# Patient Record
Sex: Female | Born: 1950
Health system: Southern US, Community
[De-identification: ages and names within clinical notes are randomized; demographics above are authoritative.]

## PROBLEM LIST (undated history)

## (undated) DIAGNOSIS — M8580 Other specified disorders of bone density and structure, unspecified site: Secondary | ICD-10-CM

## (undated) DIAGNOSIS — K219 Gastro-esophageal reflux disease without esophagitis: Secondary | ICD-10-CM

## (undated) DIAGNOSIS — M1990 Unspecified osteoarthritis, unspecified site: Secondary | ICD-10-CM

## (undated) DIAGNOSIS — H269 Unspecified cataract: Secondary | ICD-10-CM

## (undated) DIAGNOSIS — I10 Essential (primary) hypertension: Secondary | ICD-10-CM

## (undated) DIAGNOSIS — N189 Chronic kidney disease, unspecified: Secondary | ICD-10-CM

## (undated) DIAGNOSIS — E785 Hyperlipidemia, unspecified: Secondary | ICD-10-CM

## (undated) HISTORY — DX: Gastro-esophageal reflux disease without esophagitis: K21.9

## (undated) HISTORY — PX: DG THUMB LEFT HAND: HXRAD1658

## (undated) HISTORY — DX: Other specified disorders of bone density and structure, unspecified site: M85.80

## (undated) HISTORY — PX: COLONOSCOPY: SHX174

## (undated) HISTORY — PX: OTHER SURGICAL HISTORY: SHX169

## (undated) HISTORY — DX: Hyperlipidemia, unspecified: E78.5

## (undated) HISTORY — PX: POLYPECTOMY: SHX149

## (undated) HISTORY — DX: Chronic kidney disease, unspecified: N18.9

## (undated) HISTORY — DX: Unspecified osteoarthritis, unspecified site: M19.90

## (undated) HISTORY — DX: Essential (primary) hypertension: I10

## (undated) HISTORY — DX: Unspecified cataract: H26.9

## (undated) HISTORY — PX: WISDOM TOOTH EXTRACTION: SHX21

---

## 1998-11-14 ENCOUNTER — Other Ambulatory Visit: Admission: RE | Admit: 1998-11-14 | Discharge: 1998-11-14 | Payer: Self-pay | Admitting: Obstetrics and Gynecology

## 2000-03-31 ENCOUNTER — Encounter: Payer: Self-pay | Admitting: Emergency Medicine

## 2000-03-31 ENCOUNTER — Emergency Department (HOSPITAL_COMMUNITY): Admission: EM | Admit: 2000-03-31 | Discharge: 2000-03-31 | Payer: Self-pay | Admitting: Emergency Medicine

## 2000-04-28 ENCOUNTER — Other Ambulatory Visit: Admission: RE | Admit: 2000-04-28 | Discharge: 2000-04-28 | Payer: Self-pay | Admitting: Obstetrics and Gynecology

## 2001-02-10 ENCOUNTER — Encounter: Payer: Self-pay | Admitting: Obstetrics and Gynecology

## 2001-02-10 ENCOUNTER — Encounter: Admission: RE | Admit: 2001-02-10 | Discharge: 2001-02-10 | Payer: Self-pay | Admitting: Obstetrics and Gynecology

## 2001-11-25 ENCOUNTER — Other Ambulatory Visit: Admission: RE | Admit: 2001-11-25 | Discharge: 2001-11-25 | Payer: Self-pay | Admitting: Obstetrics and Gynecology

## 2003-01-16 ENCOUNTER — Encounter: Payer: Self-pay | Admitting: Obstetrics and Gynecology

## 2003-01-16 ENCOUNTER — Encounter: Admission: RE | Admit: 2003-01-16 | Discharge: 2003-01-16 | Payer: Self-pay | Admitting: Obstetrics and Gynecology

## 2003-02-13 ENCOUNTER — Other Ambulatory Visit: Admission: RE | Admit: 2003-02-13 | Discharge: 2003-02-13 | Payer: Self-pay | Admitting: Obstetrics and Gynecology

## 2004-07-29 ENCOUNTER — Encounter: Admission: RE | Admit: 2004-07-29 | Discharge: 2004-07-29 | Payer: Self-pay | Admitting: Obstetrics and Gynecology

## 2004-10-30 ENCOUNTER — Ambulatory Visit: Payer: Self-pay | Admitting: Internal Medicine

## 2004-11-04 ENCOUNTER — Ambulatory Visit (HOSPITAL_COMMUNITY): Admission: RE | Admit: 2004-11-04 | Discharge: 2004-11-04 | Payer: Self-pay | Admitting: Internal Medicine

## 2004-11-20 ENCOUNTER — Ambulatory Visit: Payer: Self-pay | Admitting: Internal Medicine

## 2005-02-18 ENCOUNTER — Encounter: Admission: RE | Admit: 2005-02-18 | Discharge: 2005-02-18 | Payer: Self-pay | Admitting: Obstetrics and Gynecology

## 2006-02-12 ENCOUNTER — Ambulatory Visit: Payer: Self-pay | Admitting: Internal Medicine

## 2006-02-26 ENCOUNTER — Ambulatory Visit: Payer: Self-pay | Admitting: Internal Medicine

## 2006-03-04 ENCOUNTER — Ambulatory Visit: Payer: Self-pay | Admitting: Internal Medicine

## 2006-04-21 ENCOUNTER — Ambulatory Visit: Payer: Self-pay | Admitting: Internal Medicine

## 2006-04-21 ENCOUNTER — Encounter: Admission: RE | Admit: 2006-04-21 | Discharge: 2006-04-21 | Payer: Self-pay | Admitting: Obstetrics and Gynecology

## 2006-06-04 ENCOUNTER — Ambulatory Visit: Payer: Self-pay | Admitting: Internal Medicine

## 2006-09-02 ENCOUNTER — Ambulatory Visit: Payer: Self-pay | Admitting: Internal Medicine

## 2006-10-15 ENCOUNTER — Ambulatory Visit: Payer: Self-pay | Admitting: Gastroenterology

## 2006-10-29 ENCOUNTER — Ambulatory Visit: Payer: Self-pay | Admitting: Gastroenterology

## 2007-03-08 ENCOUNTER — Ambulatory Visit: Payer: Self-pay | Admitting: Internal Medicine

## 2007-07-05 ENCOUNTER — Encounter: Admission: RE | Admit: 2007-07-05 | Discharge: 2007-07-05 | Payer: Self-pay | Admitting: Internal Medicine

## 2007-10-20 ENCOUNTER — Encounter: Payer: Self-pay | Admitting: Internal Medicine

## 2007-10-20 DIAGNOSIS — E785 Hyperlipidemia, unspecified: Secondary | ICD-10-CM

## 2007-10-20 DIAGNOSIS — M810 Age-related osteoporosis without current pathological fracture: Secondary | ICD-10-CM | POA: Insufficient documentation

## 2007-10-20 DIAGNOSIS — M1990 Unspecified osteoarthritis, unspecified site: Secondary | ICD-10-CM | POA: Insufficient documentation

## 2007-10-20 DIAGNOSIS — I10 Essential (primary) hypertension: Secondary | ICD-10-CM | POA: Insufficient documentation

## 2008-11-08 ENCOUNTER — Encounter: Admission: RE | Admit: 2008-11-08 | Discharge: 2008-11-08 | Payer: Self-pay | Admitting: Obstetrics and Gynecology

## 2009-01-14 ENCOUNTER — Ambulatory Visit: Payer: Self-pay | Admitting: Internal Medicine

## 2009-01-14 LAB — CONVERTED CEMR LAB
Basophils Absolute: 0.1 10*3/uL (ref 0.0–0.1)
Bilirubin, Direct: 0.1 mg/dL (ref 0.0–0.3)
CO2: 30 meq/L (ref 19–32)
Calcium: 9.4 mg/dL (ref 8.4–10.5)
Chloride: 110 meq/L (ref 96–112)
Cholesterol: 227 mg/dL — ABNORMAL HIGH (ref 0–200)
Creatinine, Ser: 1 mg/dL (ref 0.4–1.2)
HCT: 36.6 % (ref 36.0–46.0)
HDL: 51.2 mg/dL (ref >39.00)
Lymphs Abs: 1.9 10*3/uL (ref 0.7–4.0)
MCV: 91.2 fL (ref 78.0–100.0)
Monocytes Absolute: 0.4 10*3/uL (ref 0.1–1.0)
Neutrophils Relative %: 54.1 % (ref 43.0–77.0)
Nitrite: NEGATIVE
Platelets: 293 10*3/uL (ref 150.0–400.0)
RDW: 12.6 % (ref 11.5–14.6)
Sodium: 143 meq/L (ref 135–145)
Specific Gravity, Urine: 1.02 (ref 1.000–1.030)
TSH: 0.76 microintl units/mL (ref 0.35–5.50)
Total Bilirubin: 0.7 mg/dL (ref 0.3–1.2)
Total CHOL/HDL Ratio: 4
Total Protein, Urine: NEGATIVE mg/dL
Triglycerides: 58 mg/dL (ref 0.0–149.0)
WBC: 5.5 10*3/uL (ref 4.5–10.5)
pH: 6 (ref 5.0–8.0)

## 2009-01-17 ENCOUNTER — Ambulatory Visit: Payer: Self-pay | Admitting: Internal Medicine

## 2009-01-17 DIAGNOSIS — M542 Cervicalgia: Secondary | ICD-10-CM

## 2009-01-31 ENCOUNTER — Telehealth: Payer: Self-pay | Admitting: Internal Medicine

## 2009-06-05 ENCOUNTER — Ambulatory Visit: Payer: Self-pay | Admitting: Internal Medicine

## 2009-06-05 DIAGNOSIS — K439 Ventral hernia without obstruction or gangrene: Secondary | ICD-10-CM | POA: Insufficient documentation

## 2009-06-05 DIAGNOSIS — Z87891 Personal history of nicotine dependence: Secondary | ICD-10-CM | POA: Insufficient documentation

## 2010-03-07 ENCOUNTER — Ambulatory Visit: Payer: Self-pay | Admitting: Internal Medicine

## 2010-03-07 DIAGNOSIS — J019 Acute sinusitis, unspecified: Secondary | ICD-10-CM

## 2010-05-12 ENCOUNTER — Ambulatory Visit: Payer: Self-pay | Admitting: Internal Medicine

## 2010-05-12 LAB — CONVERTED CEMR LAB
ALT: 18 units/L (ref 0–35)
BUN: 21 mg/dL (ref 6–23)
Bilirubin Urine: NEGATIVE
Bilirubin, Direct: 0.1 mg/dL (ref 0.0–0.3)
Calcium: 9.8 mg/dL (ref 8.4–10.5)
Chloride: 108 meq/L (ref 96–112)
Cholesterol: 240 mg/dL — ABNORMAL HIGH (ref 0–200)
Creatinine, Ser: 1 mg/dL (ref 0.4–1.2)
Eosinophils Absolute: 0.1 10*3/uL (ref 0.0–0.7)
Eosinophils Relative: 2 % (ref 0.0–5.0)
GFR calc non Af Amer: 72.41 mL/min (ref >60)
Ketones, ur: NEGATIVE mg/dL
Lymphocytes Relative: 30.3 % (ref 12.0–46.0)
MCV: 90.9 fL (ref 78.0–100.0)
Monocytes Absolute: 0.4 10*3/uL (ref 0.1–1.0)
Neutrophils Relative %: 61.2 % (ref 43.0–77.0)
Platelets: 299 10*3/uL (ref 150.0–400.0)
Total Bilirubin: 0.3 mg/dL (ref 0.3–1.2)
Total CHOL/HDL Ratio: 5
Triglycerides: 56 mg/dL (ref 0.0–149.0)
Urobilinogen, UA: 0.2 (ref 0.0–1.0)
VLDL: 11.2 mg/dL (ref 0.0–40.0)
WBC: 6.4 10*3/uL (ref 4.5–10.5)

## 2010-05-14 ENCOUNTER — Ambulatory Visit: Payer: Self-pay | Admitting: Internal Medicine

## 2010-05-14 ENCOUNTER — Encounter: Payer: Self-pay | Admitting: Internal Medicine

## 2010-09-25 NOTE — Assessment & Plan Note (Signed)
Summary: CPX/ [facility]  /#   Vital Signs:  Patient profile:   60 year old female Height:      65 inches Weight:      160 pounds BMI:     26.72 Temp:     98.0 degrees F oral Pulse rate:   72 / minute Pulse rhythm:   regular Resp:     16 per minute BP sitting:   118 / 82  (left arm) Cuff size:   regular  Vitals Entered By: Lanier Prude, Beverly Gust) (May 14, 2010 10:42 AM) CC: CPX Is Patient Diabetic? No Comments pt is not taking Vit d.   CC:  CPX.  History of Present Illness: The patient presents for a preventive health examination   Current Medications (verified): 1)  Cardizem Cd 180 Mg  Cp24 (Diltiazem Hcl Coated Beads) .... Once Daily 2)  Vitamin D3 1000 Unit  Tabs (Cholecalciferol) .Marland Kitchen.. 1 By Mouth Daily  Allergies (verified): No Known Drug Allergies  Past History:  Past Medical History: Last updated: 01/17/2009 Hyperlipidemia Hypertension Osteoarthritis both knees Osteoporosis GYN Dr [place] Colon 2009 low vit D   family h/o cad  father died 45 m mother died of caner ?  etol 59  Family History: Last updated: 01/17/2009 M head CA F MI  Past Surgical History: Reviewed history from 01/17/2009 and no changes required. Denies surgical history  Social History: Occupation: Married Former Smoker Alcohol use-no Regular exercise-no  Review of Systems  The patient denies anorexia, fever, weight loss, weight gain, vision loss, decreased hearing, hoarseness, chest pain, syncope, dyspnea on exertion, peripheral edema, prolonged cough, headaches, hemoptysis, abdominal pain, melena, hematochezia, severe indigestion/heartburn, hematuria, incontinence, genital sores, muscle weakness, suspicious skin lesions, transient blindness, difficulty walking, depression, unusual weight change, abnormal bleeding, enlarged lymph nodes, angioedema, and breast masses.    Physical Exam  General:  alert and well-developed.   Head:  normocephalic and atraumatic.   Eyes:   vision grossly intact, pupils equal, and pupils round.   Ears:  bialt tm's WNL Nose:  no discharge  mucosal pallor and mucosal edema.   Mouth:  no pharyngeal erythema and fair dentition.   Neck:  supple and no masses.   Lungs:  normal respiratory effort and normal breath sounds.   Heart:  normal rate and regular rhythm.   Abdomen:  Small about 2 cm soft midlie bulge, NT 3-4 cm above umbilicus Msk:  No deformity or scoliosis noted of thoracic or lumbar spine.  Stiff LS spine Extremities:  no edema, no erythema  Neurologic:  No cranial nerve deficits noted. Station and gait are normal. Plantar reflexes are down-going bilaterally. DTRs are symmetrical throughout. Sensory, motor and coordinative functions appear intact. Skin:  Intact without suspicious lesions or rashes Cervical Nodes:  No lymphadenopathy noted Inguinal Nodes:  No significant adenopathy Psych:  Cognition and judgment appear intact. Alert and cooperative with normal attention span and concentration. No apparent delusions, illusions, hallucinations   Impression & Recommendations:  Problem # 1:  PHYSICAL EXAMINATION (ICD-V70.0) Assessment New Health and age related issues were discussed. Available screening tests and vaccinations were discussed as well. Healthy life style including good diet and exercise was discussed.  The labs were reviewed with the patient.  Orders: EKG w/ Interpretation (93000)  Problem # 2:  HYPERTENSION (ICD-401.9) Assessment: Unchanged  Her updated medication list for this problem includes:    Cardizem Cd 180 Mg Cp24 (Diltiazem hcl coated beads) ..... Once daily  BP today: 118/82 Prior BP: 128/88 (03/07/2010)  Labs Reviewed: K+: 4.5 (05/12/2010) Creat: : 1.0 (05/12/2010)   Chol: 240 (05/12/2010)   HDL: 53.00 (05/12/2010)   TG: 56.0 (05/12/2010)  Problem # 3:  HYPERLIPIDEMIA (ICD-272.4) Assessment: Comment Only Declined statins  Complete Medication List: 1)  Cardizem Cd 180 Mg Cp24  (Diltiazem hcl coated beads) .... Once daily 2)  Vitamin D3 1000 Unit Tabs (Cholecalciferol) .Marland Kitchen.. 1 by mouth daily  Other Orders: Tdap => 8yrs IM (04540) Admin 1st Vaccine (98119)  Patient Instructions: 1)  Please schedule a follow-up appointment in 1 year. 2)  Start taking a yoga class  Prescriptions: VITAMIN D3 1000 UNIT  TABS (CHOLECALCIFEROL) 1 by mouth daily  #200 x 3   Entered and Authorized by:   Tresa Garter MD   Signed by:   Tresa Garter MD on 05/14/2010   Method used:   Electronically to        [facility] [phone]* (retail)       [address]       [place], [place]  [address]       Ph: [phone]       Fax: [phone]   RxID:   [id number] CARDIZEM CD 180 MG  CP24 (DILTIAZEM HCL COATED BEADS) once daily  #30 Capsule x 12   Entered and Authorized by:   Tresa Garter MD   Signed by:   Tresa Garter MD on 05/14/2010   Method used:   Electronically to        [facility] [phone]* (retail)       [address]       [place], [place]  [address]       Ph: [phone]       Fax: [phone]   RxID:   [id number]     Immunizations Administered:  Tetanus Vaccine:    Vaccine Type: Tdap    Site: left deltoid    Mfr: GlaxoSmithKline    Dose: 0.5 ml    Route: IM    Given by: Lanier Prude, CMA(AAMA)    Exp. Date: 06/12/2012    Lot #: [id number]    VIS given: 07/11/08 version given May 14, 2010.  Not Administered:    Influenza Vaccine not given due to: declined

## 2010-09-25 NOTE — Assessment & Plan Note (Signed)
Summary: ?viral infection/plot/cd   Vital Signs:  Patient profile:   60 year old female Height:      65 inches (165.10 cm) Weight:      159 pounds (72.27 kg) BMI:     26.55 O2 Sat:      97 % on Room air Temp:     97.9 degrees F (36.61 degrees C) oral Pulse rate:   82 / minute Pulse rhythm:   regular Resp:     16 per minute BP sitting:   128 / 88  (left arm) Cuff size:   regular  Vitals Entered By: Lanier Prude, CMA(AAMA) (March 07, 2010 1:54 PM)  O2 Flow:  Room air CC: sore throat, cough, ear pain X 3 wks Is Patient Diabetic? No   CC:  sore throat, cough, and ear pain X 3 wks.  History of Present Illness: here after tx for URi smtpoms 3 wks ago, resolved, then now returned it seems with several days acute onset mild to mod facial pain, pressure, fever , and greenish d/c;  Pt denies CP, sob, doe, wheezing, orthopnea, pnd, worsening LE edema, palps, dizziness or syncope  Pt denies new neuro symptoms such as headache, facial or extremity weakness   Overall good compliance with meds, good tolerance.  all good compliance with meds, good tolerance.    Problems Prior to Update: 1)  Sinusitis- Acute-nos  (ICD-461.9) 2)  Hernia, Ventral  (ICD-553.20) 3)  Tobacco Use, Quit  (ICD-V15.82) 4)  Neck Pain  (ICD-723.1) 5)  Physical Examination  (ICD-V70.0) 6)  Osteoporosis  (ICD-733.00) 7)  Osteoarthritis  (ICD-715.90) 8)  Hypertension  (ICD-401.9) 9)  Hyperlipidemia  (ICD-272.4)  Medications Prior to Update: 1)  Cardizem Cd 180 Mg  Cp24 (Diltiazem Hcl Coated Beads) .... Once Daily 2)  Vitamin D3 1000 Unit  Tabs (Cholecalciferol) .Marland Kitchen.. 1 By Mouth Daily  Current Medications (verified): 1)  Cardizem Cd 180 Mg  Cp24 (Diltiazem Hcl Coated Beads) .... Once Daily 2)  Vitamin D3 1000 Unit  Tabs (Cholecalciferol) .Marland Kitchen.. 1 By Mouth Daily 3)  Cephalexin 500 Mg Caps (Cephalexin) .Marland Kitchen.. 1 By Mouth Three Times A Day  Allergies (verified): No Known Drug Allergies  Past History:  Past Medical  History: Last updated: 01/17/2009 Hyperlipidemia Hypertension Osteoarthritis both knees Osteoporosis GYN Dr Billy Coast Colon 2009 low vit D   family h/o cad  father died 36 m mother died of caner ?  etol 63  Past Surgical History: Last updated: 01/17/2009 Denies surgical history  Social History: Last updated: 01/17/2009 Occupation: Married Former Smoker Alcohol use-no Regular exercise-yes  Risk Factors: Exercise: yes (01/17/2009)  Risk Factors: Smoking Status: quit (06/05/2009)  Review of Systems       all otherwise negative per pt -    Physical Exam  General:  alert and well-developed.   Head:  normocephalic and atraumatic.   Eyes:  vision grossly intact, pupils equal, and pupils round.   Ears:  bialt tm's red, sinus tender bilat Nose:  nasal dischargemucosal pallor and mucosal edema.   Mouth:  pharyngeal erythema and fair dentition.   Neck:  supple and no masses.   Lungs:  normal respiratory effort and normal breath sounds.   Heart:  normal rate and regular rhythm.   Extremities:  no edema, no erythema    Impression & Recommendations:  Problem # 1:  SINUSITIS- ACUTE-NOS (ICD-461.9)  Her updated medication list for this problem includes:    Cephalexin 500 Mg Caps (Cephalexin) .Marland Kitchen... 1 by mouth three times  a day treat as above, f/u any worsening signs or symptoms   Problem # 2:  HYPERTENSION (ICD-401.9)  Her updated medication list for this problem includes:    Cardizem Cd 180 Mg Cp24 (Diltiazem hcl coated beads) ..... Once daily  BP today: 128/88 Prior BP: 112/86 (06/05/2009)  Labs Reviewed: K+: 4.0 (01/14/2009) Creat: : 1.0 (01/14/2009)   Chol: 227 (01/14/2009)   HDL: 51.20 (01/14/2009)   TG: 58.0 (01/14/2009) stable overall by hx and exam, ok to continue meds/tx as is   Complete Medication List: 1)  Cardizem Cd 180 Mg Cp24 (Diltiazem hcl coated beads) .... Once daily 2)  Vitamin D3 1000 Unit Tabs (Cholecalciferol) .Marland Kitchen.. 1 by mouth daily 3)   Cephalexin 500 Mg Caps (Cephalexin) .Marland Kitchen.. 1 by mouth three times a day  Patient Instructions: 1)  Please take all new medications as prescribed 2)  Continue all previous medications as before this visit  3)  You can also use Mucinex OTC or it's generic for congestion  4)  Please schedule an appointment with your primary doctor as needed 5)  you can also consider allegra OTC if any congestion might seem allergic Prescriptions: CEPHALEXIN 500 MG CAPS (CEPHALEXIN) 1 by mouth three times a day  #30 x 0   Entered and Authorized by:   Corwin Levins MD   Signed by:   Corwin Levins MD on 03/07/2010   Method used:   Print then Give to Patient   RxID:   [id number]
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
## 2011-01-14 ENCOUNTER — Other Ambulatory Visit: Payer: Self-pay | Admitting: Obstetrics and Gynecology

## 2011-01-14 DIAGNOSIS — Z1231 Encounter for screening mammogram for malignant neoplasm of breast: Secondary | ICD-10-CM

## 2011-01-29 ENCOUNTER — Ambulatory Visit
Admission: RE | Admit: 2011-01-29 | Discharge: 2011-01-29 | Disposition: A | Discharge status: Home / Self Care | Payer: BC Managed Care – PPO | Source: Ambulatory Visit | Attending: Obstetrics and Gynecology | Admitting: Obstetrics and Gynecology

## 2011-01-29 DIAGNOSIS — Z1231 Encounter for screening mammogram for malignant neoplasm of breast: Secondary | ICD-10-CM

## 2011-05-22 ENCOUNTER — Other Ambulatory Visit: Payer: Self-pay | Admitting: Internal Medicine

## 2011-08-09 ENCOUNTER — Other Ambulatory Visit: Payer: Self-pay | Admitting: Internal Medicine

## 2011-11-20 ENCOUNTER — Other Ambulatory Visit: Payer: Self-pay | Admitting: Internal Medicine

## 2012-03-15 ENCOUNTER — Other Ambulatory Visit: Payer: Self-pay | Admitting: Internal Medicine

## 2012-03-18 ENCOUNTER — Other Ambulatory Visit: Payer: Self-pay | Admitting: *Deleted

## 2012-03-18 MED ORDER — DILTIAZEM HCL ER 180 MG PO CP24: 180.0000 mg | ORAL_CAPSULE | Freq: Every day | ORAL | Status: DC

## 2012-03-18 NOTE — Telephone Encounter (Signed)
Refill sent to [facility] for BP med. States has made appt.

## 2012-04-28 ENCOUNTER — Other Ambulatory Visit: Payer: Self-pay | Admitting: Obstetrics and Gynecology

## 2012-04-28 DIAGNOSIS — Z1231 Encounter for screening mammogram for malignant neoplasm of breast: Secondary | ICD-10-CM

## 2012-05-16 ENCOUNTER — Ambulatory Visit
Admission: RE | Admit: 2012-05-16 | Discharge: 2012-05-16 | Disposition: A | Discharge status: Home / Self Care | Payer: BC Managed Care – PPO | Source: Ambulatory Visit | Attending: Obstetrics and Gynecology | Admitting: Obstetrics and Gynecology

## 2012-05-16 DIAGNOSIS — Z1231 Encounter for screening mammogram for malignant neoplasm of breast: Secondary | ICD-10-CM

## 2012-05-25 ENCOUNTER — Other Ambulatory Visit: Payer: Self-pay | Admitting: Obstetrics and Gynecology

## 2012-05-25 DIAGNOSIS — M810 Age-related osteoporosis without current pathological fracture: Secondary | ICD-10-CM

## 2012-05-27 ENCOUNTER — Encounter: Payer: BC Managed Care – PPO | Admitting: Internal Medicine

## 2012-05-31 ENCOUNTER — Ambulatory Visit
Admission: RE | Admit: 2012-05-31 | Discharge: 2012-05-31 | Disposition: A | Discharge status: Home / Self Care | Payer: BC Managed Care – PPO | Source: Ambulatory Visit | Attending: Obstetrics and Gynecology | Admitting: Obstetrics and Gynecology

## 2012-05-31 DIAGNOSIS — M810 Age-related osteoporosis without current pathological fracture: Secondary | ICD-10-CM

## 2012-06-03 ENCOUNTER — Other Ambulatory Visit: Payer: Self-pay | Admitting: Internal Medicine

## 2012-06-03 DIAGNOSIS — Z0000 Encounter for general adult medical examination without abnormal findings: Secondary | ICD-10-CM

## 2012-08-03 ENCOUNTER — Other Ambulatory Visit (INDEPENDENT_AMBULATORY_CARE_PROVIDER_SITE_OTHER): Payer: BC Managed Care – PPO

## 2012-08-03 DIAGNOSIS — Z0000 Encounter for general adult medical examination without abnormal findings: Secondary | ICD-10-CM

## 2012-08-03 LAB — HEPATIC FUNCTION PANEL
ALT: 16 U/L (ref 0–35)
AST: 19 U/L (ref 0–37)
Albumin: 3.9 g/dL (ref 3.5–5.2)
Alkaline Phosphatase: 63 U/L (ref 39–117)
Total Protein: 7.5 g/dL (ref 6.0–8.3)

## 2012-08-03 LAB — BASIC METABOLIC PANEL
Calcium: 9.4 mg/dL (ref 8.4–10.5)
Chloride: 105 mEq/L (ref 96–112)
Creatinine, Ser: 1 mg/dL (ref 0.4–1.2)
GFR: 69.48 mL/min (ref >60.00)

## 2012-08-03 LAB — URINALYSIS, ROUTINE W REFLEX MICROSCOPIC
Specific Gravity, Urine: >=1.03 (ref 1.000–1.030)
Total Protein, Urine: NEGATIVE
Urine Glucose: NEGATIVE

## 2012-08-03 LAB — LIPID PANEL: VLDL: 13.8 mg/dL (ref 0.0–40.0)

## 2012-08-03 LAB — CBC WITH DIFFERENTIAL/PLATELET
Basophils Relative: 0.6 % (ref 0.0–3.0)
Eosinophils Absolute: 0.1 10*3/uL (ref 0.0–0.7)
Hemoglobin: 13.2 g/dL (ref 12.0–15.0)
Lymphocytes Relative: 32 % (ref 12.0–46.0)
MCHC: 33.4 g/dL (ref 30.0–36.0)
MCV: 89.7 fl (ref 78.0–100.0)
Neutro Abs: 4.2 10*3/uL (ref 1.4–7.7)
RBC: 4.39 Mil/uL (ref 3.87–5.11)

## 2012-08-03 LAB — TSH: TSH: 0.82 u[IU]/mL (ref 0.35–5.50)

## 2012-08-03 LAB — LDL CHOLESTEROL, DIRECT: Direct LDL: 190.3 mg/dL

## 2012-08-08 ENCOUNTER — Encounter: Payer: Self-pay | Admitting: Internal Medicine

## 2012-08-08 ENCOUNTER — Ambulatory Visit (INDEPENDENT_AMBULATORY_CARE_PROVIDER_SITE_OTHER): Payer: BC Managed Care – PPO | Admitting: Internal Medicine

## 2012-08-08 VITALS — BP 122/80 | HR 68 | Temp 98.3°F | Resp 16 | Ht 65.5 in | Wt 160.0 lb

## 2012-08-08 DIAGNOSIS — R430 Anosmia: Secondary | ICD-10-CM | POA: Insufficient documentation

## 2012-08-08 DIAGNOSIS — R439 Unspecified disturbances of smell and taste: Secondary | ICD-10-CM

## 2012-08-08 DIAGNOSIS — E785 Hyperlipidemia, unspecified: Secondary | ICD-10-CM

## 2012-08-08 DIAGNOSIS — Z0000 Encounter for general adult medical examination without abnormal findings: Secondary | ICD-10-CM

## 2012-08-08 DIAGNOSIS — Z23 Encounter for immunization: Secondary | ICD-10-CM

## 2012-08-08 DIAGNOSIS — I10 Essential (primary) hypertension: Secondary | ICD-10-CM

## 2012-08-08 DIAGNOSIS — Z2911 Encounter for prophylactic immunotherapy for respiratory syncytial virus (RSV): Secondary | ICD-10-CM

## 2012-08-08 MED ORDER — FLUTICASONE PROPIONATE 50 MCG/ACT NA SUSP: 2.0000 | Freq: Every day | NASAL | Status: DC

## 2012-08-08 MED ORDER — AMOXICILLIN 500 MG PO CAPS: 1000.0000 mg | ORAL_CAPSULE | Freq: Two times a day (BID) | ORAL | Status: DC

## 2012-08-08 MED ORDER — VITAMIN D 1000 UNITS PO TABS: 1000.0000 [IU] | ORAL_TABLET | Freq: Every day | ORAL | Status: AC

## 2012-08-08 MED ORDER — ASPIRIN EC 81 MG PO TBEC: 81.0000 mg | DELAYED_RELEASE_TABLET | Freq: Every day | ORAL | Status: DC

## 2012-08-08 MED ORDER — LORATADINE 10 MG PO TABS: 10.0000 mg | ORAL_TABLET | Freq: Every day | ORAL | Status: DC

## 2012-08-08 NOTE — Assessment & Plan Note (Signed)
Flonase Claritin Sinus rinse Amox ENT if not better

## 2012-08-08 NOTE — Progress Notes (Signed)
  Subjective:    Patient ID: Joann Fields, female    DOB: 01/25/1951, 61 y.o.   MRN: [id number]  HPI  The patient is here for a wellness exam. The patient has been doing well overall without major physical or psychological issues going on lately.   Review of Systems  Constitutional: Negative for fever, chills, diaphoresis, activity change, appetite change, fatigue and unexpected weight change.  HENT: Negative for hearing loss, ear pain, congestion, sore throat, sneezing, mouth sores, neck pain, dental problem, voice change, postnasal drip and sinus pressure.   Eyes: Negative for pain and visual disturbance.  Respiratory: Negative for cough, chest tightness, wheezing and stridor.   Cardiovascular: Negative for chest pain, palpitations and leg swelling.  Gastrointestinal: Negative for nausea, vomiting, abdominal pain, blood in stool, abdominal distention and rectal pain.  Genitourinary: Negative for dysuria, hematuria, decreased urine volume, vaginal bleeding, vaginal discharge, difficulty urinating, vaginal pain and menstrual problem.  Musculoskeletal: Negative for back pain, joint swelling and gait problem.  Skin: Negative for color change, rash and wound.  Neurological: Negative for dizziness, tremors, syncope, speech difficulty and light-headedness.  Hematological: Negative for adenopathy.  Psychiatric/Behavioral: Negative for suicidal ideas, hallucinations, behavioral problems, confusion, sleep disturbance, dysphoric mood and decreased concentration. The patient is not hyperactive.    BP Readings from Last 3 Encounters:  08/08/12 122/80  05/14/10 118/82  03/07/10 128/88   Wt Readings from Last 3 Encounters:  08/08/12 160 lb (72.576 kg)  05/14/10 160 lb (72.576 kg)  03/07/10 159 lb (72.122 kg)       Objective:   Physical Exam  Constitutional: She appears well-developed. No distress.  HENT:  Head: Normocephalic.  Right Ear: External ear normal.  Left Ear: External ear  normal.  Nose: Nose normal.  Mouth/Throat: Oropharynx is clear and moist.  Eyes: Conjunctivae normal are normal. Pupils are equal, round, and reactive to light. Right eye exhibits no discharge. Left eye exhibits no discharge.  Neck: Normal range of motion. Neck supple. No JVD present. No tracheal deviation present. No thyromegaly present.  Cardiovascular: Normal rate, regular rhythm and normal heart sounds.   Pulmonary/Chest: No stridor. No respiratory distress. She has no wheezes.  Abdominal: Soft. Bowel sounds are normal. She exhibits no distension and no mass. There is no tenderness. There is no rebound and no guarding.  Musculoskeletal: She exhibits no edema and no tenderness.  Lymphadenopathy:    She has no cervical adenopathy.  Neurological: She displays normal reflexes. No cranial nerve deficit. She exhibits normal muscle tone. Coordination normal.  Skin: No rash noted. No erythema.  Psychiatric: She has a normal mood and affect. Her behavior is normal. Judgment and thought content normal.    Lab Results  Component Value Date   WBC 7.1 08/03/2012   HGB 13.2 08/03/2012   HCT 39.4 08/03/2012   PLT 318.0 08/03/2012   GLUCOSE 101* 08/03/2012   CHOL 246* 08/03/2012   TRIG 69.0 08/03/2012   HDL 46.80 08/03/2012   LDLDIRECT 190.3 08/03/2012   ALT 16 08/03/2012   AST 19 08/03/2012   NA 141 08/03/2012   K 4.2 08/03/2012   CL 105 08/03/2012   CREATININE 1.0 08/03/2012   BUN 15 08/03/2012   CO2 29 08/03/2012   TSH 0.82 08/03/2012         Assessment & Plan:

## 2012-08-08 NOTE — Assessment & Plan Note (Signed)
Declined statins. 

## 2012-08-09 ENCOUNTER — Encounter: Payer: Self-pay | Admitting: Internal Medicine

## 2012-08-20 NOTE — Assessment & Plan Note (Signed)
Continue with current prescription therapy as reflected on the Med list.  

## 2012-08-20 NOTE — Assessment & Plan Note (Signed)
We discussed age appropriate health related issues, including available/recomended screening tests and vaccinations. We discussed a need for adhering to healthy diet and exercise. Labs/EKG were reviewed/ordered. All questions were answered.   

## 2012-09-20 ENCOUNTER — Other Ambulatory Visit: Payer: Self-pay | Admitting: Endodontics

## 2012-09-28 ENCOUNTER — Ambulatory Visit (INDEPENDENT_AMBULATORY_CARE_PROVIDER_SITE_OTHER): Payer: BC Managed Care – PPO | Admitting: Internal Medicine

## 2012-09-28 ENCOUNTER — Encounter: Payer: Self-pay | Admitting: Internal Medicine

## 2012-09-28 ENCOUNTER — Telehealth: Payer: Self-pay | Admitting: Internal Medicine

## 2012-09-28 VITALS — BP 110/88 | HR 96 | Temp 98.2°F | Resp 16 | Wt 160.0 lb

## 2012-09-28 DIAGNOSIS — T887XXA Unspecified adverse effect of drug or medicament, initial encounter: Secondary | ICD-10-CM

## 2012-09-28 DIAGNOSIS — L299 Pruritus, unspecified: Secondary | ICD-10-CM

## 2012-09-28 DIAGNOSIS — T50905A Adverse effect of unspecified drugs, medicaments and biological substances, initial encounter: Secondary | ICD-10-CM

## 2012-09-28 MED ORDER — METHYLPREDNISOLONE ACETATE 80 MG/ML IJ SUSP
120.0000 mg | Freq: Once | INTRAMUSCULAR | Status: AC
  Administered 2012-09-28: 120 mg via INTRAMUSCULAR

## 2012-09-28 MED ORDER — PREDNISONE 10 MG PO TABS: ORAL_TABLET | ORAL | Status: DC

## 2012-09-28 NOTE — Telephone Encounter (Signed)
Patient Information:  Caller Name: Zoriyah  Phone: [phone]  Patient: Joann Fields, Joann Fields  Gender: Female  DOB: September 02, 1950  Age: 62 Years  PCP: Plotnikov, Alex (Adults only)  Office Follow Up:  Does the office need to follow up with this patient?: No  Instructions For The Office: N/A   Symptoms  Reason For Call & Symptoms: Reports rash started on abdomen and has spread to arms, back and legs. Rash is not located on the face or neck. Reports itching.  Reviewed Health History In EMR: Yes  Reviewed Medications In EMR: Yes  Reviewed Allergies In EMR: Yes  Reviewed Surgeries / Procedures: Yes  Date of Onset of Symptoms: 09/27/2012  Guideline(s) Used:  Rash or Redness - Widespread  Rash - Widespread on Drugs - Drug Reaction  Disposition Per Guideline:   See Today in Office  Reason For Disposition Reached:   All other patients with widespread rash taking a new medication  Advice Given:  N/A  Appointment Scheduled:  09/28/2012 15:15:00 Appointment Scheduled Provider:  Nicki Reaper

## 2012-09-28 NOTE — Patient Instructions (Signed)

## 2012-09-28 NOTE — Progress Notes (Signed)
Subjective:    Patient ID: Joann Fields, female    DOB: 08/27/1950, 62 y.o.   MRN: [id number]  HPI  Pt present to the clinic today with c/o a rash all over her body. This started yesterday. It is very itchy. She has taken benadryl which has helped with the itching but has not made the rash go away. She has been taking Clindamycin and codeine over the past 2 weeks following dental surgery. She has no drug allergies that she is aware of. She has not had swelling of the lips or trouble breathing.  Review of Systems      Past Medical History  Diagnosis Date  . Hypertension   . Hyperlipidemia     Current Outpatient Prescriptions  Medication Sig Dispense Refill  . amoxicillin (AMOXIL) 500 MG capsule Take 2 capsules (1,000 mg total) by mouth 2 (two) times daily.  40 capsule  0  . aspirin EC 81 MG tablet Take 1 tablet (81 mg total) by mouth daily.  100 tablet  3  . cholecalciferol (VITAMIN D) 1000 UNITS tablet Take 1 tablet (1,000 Units total) by mouth daily.  100 tablet  3  . diltiazem (DILACOR XR) 180 MG 24 hr capsule Take 1 capsule (180 mg total) by mouth daily.  90 capsule  3  . fluticasone (FLONASE) 50 MCG/ACT nasal spray Place 2 sprays into the nose daily.  16 g  6  . loratadine (CLARITIN) 10 MG tablet Take 1 tablet (10 mg total) by mouth daily.  100 tablet  3    No Known Allergies  Family History  Problem Relation Age of Onset  . Cancer Mother 69    ?  Marland Kitchen Heart disease Father 54    MI  . Alzheimer's disease Brother 38  . Early death Maternal Aunt   . Hearing loss Maternal Aunt     History   Social History  . Marital Status: Single    Spouse Name: N/A    Number of Children: N/A  . Years of Education: N/A   Occupational History  . Not on file.   Social History Main Topics  . Smoking status: Former Games developer  . Smokeless tobacco: Not on file     Comment: only in College  . Alcohol Use: No  . Drug Use: No  . Sexually Active: Not on file   Other Topics Concern   . Not on file   Social History Narrative  . No narrative on file     Constitutional: Denies fever, malaise, fatigue, headache or abrupt weight changes.  Skin: Pt reports generalized itchy rash. Denies redness, lesions or ulcercations.    No other specific complaints in a complete review of systems (except as listed in HPI above).  Objective:   Physical Exam   BP 110/88  Pulse 96  Temp 98.2 F (36.8 C) (Oral)  Resp 16  Wt 160 lb (72.576 kg)  SpO2 97% Wt Readings from Last 3 Encounters:  09/28/12 160 lb (72.576 kg)  08/08/12 160 lb (72.576 kg)  05/14/10 160 lb (72.576 kg)    General: Appears her stated age, well developed, well nourished in NAD. Skin: Pruritic rash noted on abdomen, left arm, entire back and legs, resembling allergic reaction. Cardiovascular: Normal rate and rhythm. S1,S2 noted.  No murmur, rubs or gallops noted. No JVD or BLE edema. No carotid bruits noted. Pulmonary/Chest: Normal effort and positive vesicular breath sounds. No respiratory distress. No wheezes, rales or ronchi noted.  Assessment & Plan:   Puritus, secondary to medication reaction, new onset with additional workup required:  80mg  Depo today IM eRx for pred taper Take benadryl as needed for itching  RTC as needed or if symptoms persist

## 2012-09-28 NOTE — Addendum Note (Signed)
Addended by: Elnora Morrison on: 09/28/2012 04:01 PM   Modules accepted: Orders

## 2013-01-09 ENCOUNTER — Other Ambulatory Visit: Payer: Self-pay | Admitting: Internal Medicine

## 2013-02-17 ENCOUNTER — Encounter: Payer: Self-pay | Admitting: Internal Medicine

## 2013-02-17 ENCOUNTER — Ambulatory Visit (INDEPENDENT_AMBULATORY_CARE_PROVIDER_SITE_OTHER): Payer: BC Managed Care – PPO | Admitting: Internal Medicine

## 2013-02-17 VITALS — BP 138/76 | HR 67 | Temp 97.8°F | Resp 16 | Wt 154.1 lb

## 2013-02-17 DIAGNOSIS — L720 Epidermal cyst: Secondary | ICD-10-CM

## 2013-02-17 DIAGNOSIS — B351 Tinea unguium: Secondary | ICD-10-CM

## 2013-02-17 DIAGNOSIS — L723 Sebaceous cyst: Secondary | ICD-10-CM

## 2013-02-17 NOTE — Progress Notes (Signed)
Subjective:    Patient ID: Joann Fields, female    DOB: May 08, 1951, 62 y.o.   MRN: [id number]  HPI  Pt presents to the clinic today with c/o a lump underneath her skin in her right arm. It is small and nontender. She has had these little lumps before but cannot remember why she has them. She also has concerns about toenail fungus. She reports that her toenails are dark and sometimes fall off. She has never had treatment for this in the past. She would like to know what can be done about it.  Review of Systems      Past Medical History  Diagnosis Date  . Hypertension   . Hyperlipidemia     Current Outpatient Prescriptions  Medication Sig Dispense Refill  . amoxicillin (AMOXIL) 500 MG capsule Take 2 capsules (1,000 mg total) by mouth 2 (two) times daily.  40 capsule  0  . aspirin EC 81 MG tablet Take 1 tablet (81 mg total) by mouth daily.  100 tablet  3  . cholecalciferol (VITAMIN D) 1000 UNITS tablet Take 1 tablet (1,000 Units total) by mouth daily.  100 tablet  3  . diltiazem (DILACOR XR) 180 MG 24 hr capsule Take 1 capsule (180 mg total) by mouth daily.  90 capsule  3  . fluticasone (FLONASE) 50 MCG/ACT nasal spray Place 2 sprays into the nose daily.  16 g  6  . loratadine (CLARITIN) 10 MG tablet Take 1 tablet (10 mg total) by mouth daily.  100 tablet  3  . predniSONE (DELTASONE) 10 MG tablet Take 3 tablets on days 1-3, take 2 tablets on days 4-6, take 1 tablet on days 7-9  18 tablet  0   No current facility-administered medications for this visit.    No Known Allergies  Family History  Problem Relation Age of Onset  . Cancer Mother 14    ?  Marland Kitchen Heart disease Father 69    MI  . Alzheimer's disease Brother 109  . Early death Maternal Aunt   . Hearing loss Maternal Aunt     History   Social History  . Marital Status: Single    Spouse Name: N/A    Number of Children: N/A  . Years of Education: N/A   Occupational History  . Not on file.   Social History Main  Topics  . Smoking status: Former Games developer  . Smokeless tobacco: Not on file     Comment: only in College  . Alcohol Use: No  . Drug Use: No  . Sexually Active: Not on file   Other Topics Concern  . Not on file   Social History Narrative  . No narrative on file     Constitutional: Denies fever, malaise, fatigue, headache or abrupt weight changes.  Skin: Pt reports lump underneath skin and toe fungus. Denies redness, rashes, lesions or ulcercations.    No other specific complaints in a complete review of systems (except as listed in HPI above).  Objective:   Physical Exam  BP 138/76  Pulse 67  Temp(Src) 97.8 F (36.6 C) (Oral)  Resp 16  Wt 154 lb 1.3 oz (69.89 kg)  BMI 25.24 kg/m2  SpO2 96% Wt Readings from Last 3 Encounters:  02/17/13 154 lb 1.3 oz (69.89 kg)  09/28/12 160 lb (72.576 kg)  08/08/12 160 lb (72.576 kg)    General: Appears their stated age, well developed, well nourished in NAD. Skin: Warm, dry and intact. No rashes, lesions or  ulcerations noted. Epidermal inclusion cyst of right armpit. Toenail fungus noted on feet bilaterally. Cardiovascular: Normal rate and rhythm. S1,S2 noted.  No murmur, rubs or gallops noted. No JVD or BLE edema. No carotid bruits noted. Pulmonary/Chest: Normal effort and positive vesicular breath sounds. No respiratory distress. No wheezes, rales or ronchi noted.   :  BMET    Component Value Date/Time   NA 141 08/03/2012 1109   K 4.2 08/03/2012 1109   CL 105 08/03/2012 1109   CO2 29 08/03/2012 1109   GLUCOSE 101* 08/03/2012 1109   BUN 15 08/03/2012 1109   CREATININE 1.0 08/03/2012 1109   CALCIUM 9.4 08/03/2012 1109   GFRNONAA 72.41 05/12/2010 0916    Lipid Panel     Component Value Date/Time   CHOL 246* 08/03/2012 1109   TRIG 69.0 08/03/2012 1109   HDL 46.80 08/03/2012 1109   CHOLHDL 5 08/03/2012 1109   VLDL 13.8 08/03/2012 1109    CBC    Component Value Date/Time   WBC 7.1 08/03/2012 1109   RBC 4.39 08/03/2012  1109   HGB 13.2 08/03/2012 1109   HCT 39.4 08/03/2012 1109   PLT 318.0 08/03/2012 1109   MCV 89.7 08/03/2012 1109   MCHC 33.4 08/03/2012 1109   RDW 13.7 08/03/2012 1109   LYMPHSABS 2.3 08/03/2012 1109   MONOABS 0.5 08/03/2012 1109   EOSABS 0.1 08/03/2012 1109   BASOSABS 0.0 08/03/2012 1109    Hgb A1C No results found for this basename: HGBA1C         Assessment & Plan:   Epidermal inclusion cyst, right armpit:  Reassurance given If it is not bothering you, I would not do anything about it Watch for signs of redness, swelling and tenderness  Onychomycosis, bilateral feet:  Discussed different treatment options risks and benefits Pt would like to defer treatment at this time.

## 2013-02-17 NOTE — Patient Instructions (Signed)

## 2013-04-21 ENCOUNTER — Other Ambulatory Visit: Payer: Self-pay | Admitting: Internal Medicine

## 2013-05-02 ENCOUNTER — Other Ambulatory Visit: Payer: Self-pay

## 2013-05-02 DIAGNOSIS — Z1231 Encounter for screening mammogram for malignant neoplasm of breast: Secondary | ICD-10-CM

## 2013-06-06 ENCOUNTER — Ambulatory Visit: Payer: BC Managed Care – PPO

## 2013-07-26 ENCOUNTER — Ambulatory Visit
Admission: RE | Admit: 2013-07-26 | Discharge: 2013-07-26 | Disposition: A | Discharge status: Home / Self Care | Payer: BC Managed Care – PPO | Source: Ambulatory Visit

## 2013-07-26 DIAGNOSIS — Z1231 Encounter for screening mammogram for malignant neoplasm of breast: Secondary | ICD-10-CM

## 2013-09-04 ENCOUNTER — Encounter: Payer: BC Managed Care – PPO | Admitting: Internal Medicine

## 2013-09-12 ENCOUNTER — Encounter: Payer: Self-pay | Admitting: Internal Medicine

## 2013-09-12 ENCOUNTER — Ambulatory Visit (INDEPENDENT_AMBULATORY_CARE_PROVIDER_SITE_OTHER): Payer: BC Managed Care – PPO | Admitting: Internal Medicine

## 2013-09-12 VITALS — BP 130/90 | HR 76 | Temp 98.3°F | Resp 16 | Ht 65.0 in | Wt 153.0 lb

## 2013-09-12 DIAGNOSIS — R430 Anosmia: Secondary | ICD-10-CM

## 2013-09-12 DIAGNOSIS — I10 Essential (primary) hypertension: Secondary | ICD-10-CM

## 2013-09-12 DIAGNOSIS — M1990 Unspecified osteoarthritis, unspecified site: Secondary | ICD-10-CM

## 2013-09-12 DIAGNOSIS — Z0000 Encounter for general adult medical examination without abnormal findings: Secondary | ICD-10-CM

## 2013-09-12 DIAGNOSIS — Z23 Encounter for immunization: Secondary | ICD-10-CM

## 2013-09-12 DIAGNOSIS — R439 Unspecified disturbances of smell and taste: Secondary | ICD-10-CM

## 2013-09-12 DIAGNOSIS — H6120 Impacted cerumen, unspecified ear: Secondary | ICD-10-CM | POA: Insufficient documentation

## 2013-09-12 MED ORDER — VITAMIN D 1000 UNITS PO TABS: 1000.0000 [IU] | ORAL_TABLET | Freq: Every day | ORAL | Status: AC

## 2013-09-12 NOTE — Progress Notes (Signed)
Subjective:   HPI  The patient is here for a wellness exam. The patient has been doing well overall without major physical or psychological issues going on lately, except for some family stress... C/o poor sense of smell  Review of Systems  Constitutional: Negative for fever, chills, diaphoresis, activity change, appetite change, fatigue and unexpected weight change.  HENT: Negative for congestion, dental problem, ear pain, hearing loss, mouth sores, postnasal drip, sinus pressure, sneezing, sore throat and voice change.   Eyes: Negative for pain and visual disturbance.  Respiratory: Negative for cough, chest tightness, wheezing and stridor.   Cardiovascular: Negative for chest pain, palpitations and leg swelling.  Gastrointestinal: Negative for nausea, vomiting, abdominal pain, blood in stool, abdominal distention and rectal pain.  Genitourinary: Negative for dysuria, hematuria, decreased urine volume, vaginal bleeding, vaginal discharge, difficulty urinating, vaginal pain and menstrual problem.  Musculoskeletal: Negative for back pain, gait problem, joint swelling and neck pain.  Skin: Negative for color change, rash and wound.  Neurological: Negative for dizziness, tremors, syncope, speech difficulty and light-headedness.  Hematological: Negative for adenopathy.  Psychiatric/Behavioral: Negative for suicidal ideas, hallucinations, behavioral problems, confusion, sleep disturbance, dysphoric mood and decreased concentration. The patient is not hyperactive.    BP Readings from Last 3 Encounters:  09/12/13 130/90  02/17/13 138/76  09/28/12 110/88   Wt Readings from Last 3 Encounters:  09/12/13 153 lb (69.4 kg)  02/17/13 154 lb 1.3 oz (69.89 kg)  09/28/12 160 lb (72.576 kg)       Objective:   Physical Exam  Constitutional: She appears well-developed. No distress.  HENT:  Head: Normocephalic.  Right Ear: External ear normal.  Left Ear: External ear normal.  Nose: Nose  normal.  Mouth/Throat: Oropharynx is clear and moist.  Eyes: Conjunctivae are normal. Pupils are equal, round, and reactive to light. Right eye exhibits no discharge. Left eye exhibits no discharge.  Neck: Normal range of motion. Neck supple. No JVD present. No tracheal deviation present. No thyromegaly present.  Cardiovascular: Normal rate, regular rhythm and normal heart sounds.   Pulmonary/Chest: No stridor. No respiratory distress. She has no wheezes.  Abdominal: Soft. Bowel sounds are normal. She exhibits no distension and no mass. There is no tenderness. There is no rebound and no guarding.  Musculoskeletal: She exhibits no edema and no tenderness.  Lymphadenopathy:    She has no cervical adenopathy.  Neurological: She displays normal reflexes. No cranial nerve deficit. She exhibits normal muscle tone. Coordination normal.  Skin: No rash noted. No erythema.  Psychiatric: She has a normal mood and affect. Her behavior is normal. Judgment and thought content normal.  R wax  Lab Results  Component Value Date   WBC 7.1 08/03/2012   HGB 13.2 08/03/2012   HCT 39.4 08/03/2012   PLT 318.0 08/03/2012   GLUCOSE 101* 08/03/2012   CHOL 246* 08/03/2012   TRIG 69.0 08/03/2012   HDL 46.80 08/03/2012   LDLDIRECT 190.3 08/03/2012   ALT 16 08/03/2012   AST 19 08/03/2012   NA 141 08/03/2012   K 4.2 08/03/2012   CL 105 08/03/2012   CREATININE 1.0 08/03/2012   BUN 15 08/03/2012   CO2 29 08/03/2012   TSH 0.82 08/03/2012     Procedure Note :     Procedure :  Ear irrigation   Indication:  Cerumen impaction R   Risks, including pain, dizziness, eardrum perforation, bleeding, infection and others as well as benefits were explained to the patient in detail. Verbal consent was  obtained and the patient agreed to proceed.    We used "The Elephant Ear Irrigation Device" filled with lukewarm water for irrigation. A large amount wax was recovered. Procedure has also required manual wax removal with  an ear loop.   Tolerated well. Complications: None.   Postprocedure instructions :  Call if problems.       Assessment & Plan:

## 2013-09-12 NOTE — Assessment & Plan Note (Signed)
Continue with current prescription therapy as reflected on the Med list.  

## 2013-09-12 NOTE — Progress Notes (Signed)
Pre visit review using our clinic review tool, if applicable. No additional management support is needed unless otherwise documented below in the visit note. 

## 2013-09-12 NOTE — Assessment & Plan Note (Signed)
Tylenol prn 

## 2013-09-12 NOTE — Assessment & Plan Note (Signed)
Will irrigate 

## 2013-09-12 NOTE — Assessment & Plan Note (Signed)
ENT ref 

## 2013-09-12 NOTE — Assessment & Plan Note (Signed)
labs

## 2013-09-21 ENCOUNTER — Other Ambulatory Visit (INDEPENDENT_AMBULATORY_CARE_PROVIDER_SITE_OTHER): Payer: BC Managed Care – PPO

## 2013-09-21 DIAGNOSIS — I10 Essential (primary) hypertension: Secondary | ICD-10-CM

## 2013-09-21 DIAGNOSIS — R430 Anosmia: Secondary | ICD-10-CM

## 2013-09-21 DIAGNOSIS — Z0000 Encounter for general adult medical examination without abnormal findings: Secondary | ICD-10-CM

## 2013-09-21 DIAGNOSIS — M1990 Unspecified osteoarthritis, unspecified site: Secondary | ICD-10-CM

## 2013-09-21 DIAGNOSIS — R439 Unspecified disturbances of smell and taste: Secondary | ICD-10-CM

## 2013-09-21 LAB — VITAMIN B12: Vitamin B-12: 234 pg/mL (ref 211–911)

## 2013-09-21 LAB — URINALYSIS, ROUTINE W REFLEX MICROSCOPIC
Bilirubin Urine: NEGATIVE
Ketones, ur: NEGATIVE
Nitrite: NEGATIVE
SPECIFIC GRAVITY, URINE: 1.025 (ref 1.000–1.030)
Total Protein, Urine: NEGATIVE
URINE GLUCOSE: NEGATIVE
UROBILINOGEN UA: 0.2 (ref 0.0–1.0)
pH: 6 (ref 5.0–8.0)

## 2013-09-21 LAB — LIPID PANEL
CHOL/HDL RATIO: 4
CHOLESTEROL: 241 mg/dL — AB (ref 0–200)
HDL: 53.8 mg/dL (ref >39.00)
Triglycerides: 122 mg/dL (ref 0.0–149.0)
VLDL: 24.4 mg/dL (ref 0.0–40.0)

## 2013-09-21 LAB — CBC WITH DIFFERENTIAL/PLATELET
Basophils Absolute: 0 10*3/uL (ref 0.0–0.1)
Basophils Relative: 0.6 % (ref 0.0–3.0)
EOS PCT: 1.4 % (ref 0.0–5.0)
Eosinophils Absolute: 0.1 10*3/uL (ref 0.0–0.7)
HCT: 41 % (ref 36.0–46.0)
HEMOGLOBIN: 13.5 g/dL (ref 12.0–15.0)
LYMPHS ABS: 2.4 10*3/uL (ref 0.7–4.0)
LYMPHS PCT: 34.2 % (ref 12.0–46.0)
MCHC: 32.8 g/dL (ref 30.0–36.0)
MCV: 92.2 fl (ref 78.0–100.0)
MONOS PCT: 6.9 % (ref 3.0–12.0)
Monocytes Absolute: 0.5 10*3/uL (ref 0.1–1.0)
Neutro Abs: 4.1 10*3/uL (ref 1.4–7.7)
Neutrophils Relative %: 56.9 % (ref 43.0–77.0)
Platelets: 318 10*3/uL (ref 150.0–400.0)
RBC: 4.45 Mil/uL (ref 3.87–5.11)
RDW: 13.8 % (ref 11.5–14.6)
WBC: 7.2 10*3/uL (ref 4.5–10.5)

## 2013-09-21 LAB — BASIC METABOLIC PANEL
BUN: 17 mg/dL (ref 6–23)
CALCIUM: 9.9 mg/dL (ref 8.4–10.5)
CHLORIDE: 107 meq/L (ref 96–112)
CO2: 28 meq/L (ref 19–32)
Creatinine, Ser: 1 mg/dL (ref 0.4–1.2)
GFR: 72.47 mL/min (ref >60.00)
GLUCOSE: 96 mg/dL (ref 70–99)
Potassium: 4 mEq/L (ref 3.5–5.1)
SODIUM: 141 meq/L (ref 135–145)

## 2013-09-21 LAB — LDL CHOLESTEROL, DIRECT: Direct LDL: 165.3 mg/dL

## 2013-09-21 LAB — HEPATIC FUNCTION PANEL
ALT: 14 U/L (ref 0–35)
AST: 20 U/L (ref 0–37)
Albumin: 3.8 g/dL (ref 3.5–5.2)
Alkaline Phosphatase: 60 U/L (ref 39–117)
BILIRUBIN TOTAL: 0.6 mg/dL (ref 0.3–1.2)
Bilirubin, Direct: 0 mg/dL (ref 0.0–0.3)
Total Protein: 7.4 g/dL (ref 6.0–8.3)

## 2013-09-21 LAB — TSH: TSH: 1.07 u[IU]/mL (ref 0.35–5.50)

## 2013-09-22 ENCOUNTER — Other Ambulatory Visit: Payer: Self-pay | Admitting: Internal Medicine

## 2013-09-22 MED ORDER — VITAMIN B-12 1000 MCG SL SUBL: 1.0000 | SUBLINGUAL_TABLET | Freq: Every day | SUBLINGUAL | Status: DC

## 2014-05-22 ENCOUNTER — Other Ambulatory Visit: Payer: Self-pay | Admitting: *Deleted

## 2014-05-22 MED ORDER — DILTIAZEM HCL ER 180 MG PO CP24: 180.0000 mg | ORAL_CAPSULE | Freq: Every day | ORAL | Status: DC

## 2014-08-06 ENCOUNTER — Other Ambulatory Visit: Payer: Self-pay | Admitting: Obstetrics and Gynecology

## 2014-08-06 DIAGNOSIS — M810 Age-related osteoporosis without current pathological fracture: Secondary | ICD-10-CM

## 2014-08-14 ENCOUNTER — Ambulatory Visit
Admission: RE | Admit: 2014-08-14 | Discharge: 2014-08-14 | Disposition: A | Discharge status: Home / Self Care | Payer: 59 | Source: Ambulatory Visit | Attending: Obstetrics and Gynecology | Admitting: Obstetrics and Gynecology

## 2014-08-14 DIAGNOSIS — M810 Age-related osteoporosis without current pathological fracture: Secondary | ICD-10-CM

## 2014-09-14 ENCOUNTER — Encounter: Payer: BC Managed Care – PPO | Admitting: Internal Medicine

## 2014-09-24 ENCOUNTER — Encounter: Payer: Self-pay | Admitting: Internal Medicine

## 2014-09-24 ENCOUNTER — Ambulatory Visit (INDEPENDENT_AMBULATORY_CARE_PROVIDER_SITE_OTHER): Payer: Medicare Other | Admitting: Internal Medicine

## 2014-09-24 VITALS — BP 138/94 | HR 83 | Temp 98.2°F | Ht 65.0 in | Wt 158.0 lb

## 2014-09-24 DIAGNOSIS — M810 Age-related osteoporosis without current pathological fracture: Secondary | ICD-10-CM

## 2014-09-24 DIAGNOSIS — Z0000 Encounter for general adult medical examination without abnormal findings: Secondary | ICD-10-CM

## 2014-09-24 DIAGNOSIS — Z23 Encounter for immunization: Secondary | ICD-10-CM

## 2014-09-24 DIAGNOSIS — E538 Deficiency of other specified B group vitamins: Secondary | ICD-10-CM

## 2014-09-24 DIAGNOSIS — J069 Acute upper respiratory infection, unspecified: Secondary | ICD-10-CM | POA: Insufficient documentation

## 2014-09-24 DIAGNOSIS — I10 Essential (primary) hypertension: Secondary | ICD-10-CM

## 2014-09-24 LAB — POCT RAPID STREP A (OFFICE): Rapid Strep A Screen: NEGATIVE

## 2014-09-24 MED ORDER — VITAMIN D 1000 UNITS PO TABS: 1000.0000 [IU] | ORAL_TABLET | Freq: Every day | ORAL | Status: AC

## 2014-09-24 MED ORDER — PROMETHAZINE-CODEINE 6.25-10 MG/5ML PO SYRP: 5.0000 mL | ORAL_SOLUTION | ORAL | Status: DC | PRN

## 2014-09-24 NOTE — Assessment & Plan Note (Signed)
Continue with current prescription therapy as reflected on the Med list. Labs  

## 2014-09-24 NOTE — Assessment & Plan Note (Signed)
Continue with current prescription therapy as reflected on the Med list.  

## 2014-09-24 NOTE — Patient Instructions (Addendum)
Valerian root for insomnia   Preventive Care for Adults A healthy lifestyle and preventive care can promote health and wellness. Preventive health guidelines for women include the following key practices.  A routine yearly physical is a good way to check with your health care provider about your health and preventive screening. It is a chance to share any concerns and updates on your health and to receive a thorough exam.  Visit your dentist for a routine exam and preventive care every 6 months. Brush your teeth twice a day and floss once a day. Good oral hygiene prevents tooth decay and gum disease.  The frequency of eye exams is based on your age, health, family medical history, use of contact lenses, and other factors. Follow your health care provider's recommendations for frequency of eye exams.  Eat a healthy diet. Foods like vegetables, fruits, whole grains, low-fat dairy products, and lean protein foods contain the nutrients you need without too many calories. Decrease your intake of foods high in solid fats, added sugars, and salt. Eat the right amount of calories for you.Get information about a proper diet from your health care provider, if necessary.  Regular physical exercise is one of the most important things you can do for your health. Most adults should get at least 150 minutes of moderate-intensity exercise (any activity that increases your heart rate and causes you to sweat) each week. In addition, most adults need muscle-strengthening exercises on 2 or more days a week.  Maintain a healthy weight. The body mass index (BMI) is a screening tool to identify possible weight problems. It provides an estimate of body fat based on height and weight. Your health care provider can find your BMI and can help you achieve or maintain a healthy weight.For adults 20 years and older:  A BMI below 18.5 is considered underweight.  A BMI of 18.5 to 24.9 is normal.  A BMI of 25 to 29.9 is  considered overweight.  A BMI of 30 and above is considered obese.  Maintain normal blood lipids and cholesterol levels by exercising and minimizing your intake of saturated fat. Eat a balanced diet with plenty of fruit and vegetables. Blood tests for lipids and cholesterol should begin at age 20 and be repeated every 5 years. If your lipid or cholesterol levels are high, you are over 50, or you are at high risk for heart disease, you may need your cholesterol levels checked more frequently.Ongoing high lipid and cholesterol levels should be treated with medicines if diet and exercise are not working.  If you smoke, find out from your health care provider how to quit. If you do not use tobacco, do not start.  Lung cancer screening is recommended for adults aged 55-80 years who are at high risk for developing lung cancer because of a history of smoking. A yearly low-dose CT scan of the lungs is recommended for people who have at least a 30-pack-year history of smoking and are a current smoker or have quit within the past 15 years. A pack year of smoking is smoking an average of 1 pack of cigarettes a day for 1 year (for example: 1 pack a day for 30 years or 2 packs a day for 15 years). Yearly screening should continue until the smoker has stopped smoking for at least 15 years. Yearly screening should be stopped for people who develop a health problem that would prevent them from having lung cancer treatment.  If you are pregnant, do not   drink alcohol. If you are breastfeeding, be very cautious about drinking alcohol. If you are not pregnant and choose to drink alcohol, do not have more than 1 drink per day. One drink is considered to be 12 ounces (355 mL) of beer, 5 ounces (148 mL) of wine, or 1.5 ounces (44 mL) of liquor.  Avoid use of street drugs. Do not share needles with anyone. Ask for help if you need support or instructions about stopping the use of drugs.  High blood pressure causes heart  disease and increases the risk of stroke. Your blood pressure should be checked at least every 1 to 2 years. Ongoing high blood pressure should be treated with medicines if weight loss and exercise do not work.  If you are 55-79 years old, ask your health care provider if you should take aspirin to prevent strokes.  Diabetes screening involves taking a blood sample to check your fasting blood sugar level. This should be done once every 3 years, after age 45, if you are within normal weight and without risk factors for diabetes. Testing should be considered at a younger age or be carried out more frequently if you are overweight and have at least 1 risk factor for diabetes.  Breast cancer screening is essential preventive care for women. You should practice "breast self-awareness." This means understanding the normal appearance and feel of your breasts and may include breast self-examination. Any changes detected, no matter how small, should be reported to a health care provider. Women in their 20s and 30s should have a clinical breast exam (CBE) by a health care provider as part of a regular health exam every 1 to 3 years. After age 40, women should have a CBE every year. Starting at age 40, women should consider having a mammogram (breast X-ray test) every year. Women who have a family history of breast cancer should talk to their health care provider about genetic screening. Women at a high risk of breast cancer should talk to their health care providers about having an MRI and a mammogram every year.  Breast cancer gene (BRCA)-related cancer risk assessment is recommended for women who have family members with BRCA-related cancers. BRCA-related cancers include breast, ovarian, tubal, and peritoneal cancers. Having family members with these cancers may be associated with an increased risk for harmful changes (mutations) in the breast cancer genes BRCA1 and BRCA2. Results of the assessment will determine  the need for genetic counseling and BRCA1 and BRCA2 testing.  Routine pelvic exams to screen for cancer are no longer recommended for nonpregnant women who are considered low risk for cancer of the pelvic organs (ovaries, uterus, and vagina) and who do not have symptoms. Ask your health care provider if a screening pelvic exam is right for you.  If you have had past treatment for cervical cancer or a condition that could lead to cancer, you need Pap tests and screening for cancer for at least 20 years after your treatment. If Pap tests have been discontinued, your risk factors (such as having a new sexual partner) need to be reassessed to determine if screening should be resumed. Some women have medical problems that increase the chance of getting cervical cancer. In these cases, your health care provider may recommend more frequent screening and Pap tests.  The HPV test is an additional test that may be used for cervical cancer screening. The HPV test looks for the virus that can cause the cell changes on the cervix. The cells collected   cells collected during the Pap test can be tested for HPV. The HPV test could be used to screen women aged 36 years and older, and should be used in women of any age who have unclear Pap test results. After the age of 52, women should have HPV testing at the same frequency as a Pap test.  Colorectal cancer can be detected and often prevented. Most routine colorectal cancer screening begins at the age of 28 years and continues through age 37 years. However, your health care provider may recommend screening at an earlier age if you have risk factors for colon cancer. On a yearly basis, your health care provider may provide home test kits to check for hidden blood in the stool. Use of a small camera at the end of a tube, to directly examine the colon (sigmoidoscopy or colonoscopy), can detect the earliest forms of colorectal cancer. Talk to your health care provider about this at age 13, when  routine screening begins. Direct exam of the colon should be repeated every 5-10 years through age 29 years, unless early forms of pre-cancerous polyps or small growths are found.  People who are at an increased risk for hepatitis B should be screened for this virus. You are considered at high risk for hepatitis B if:  You were born in a country where hepatitis B occurs often. Talk with your health care provider about which countries are considered high risk.  Your parents were born in a high-risk country and you have not received a shot to protect against hepatitis B (hepatitis B vaccine).  You have HIV or AIDS.  You use needles to inject street drugs.  You live with, or have sex with, someone who has hepatitis B.  You get hemodialysis treatment.  You take certain medicines for conditions like cancer, organ transplantation, and autoimmune conditions.  Hepatitis C blood testing is recommended for all people born from [birth year] through 1965 and any individual with known risks for hepatitis C.  Practice safe sex. Use condoms and avoid high-risk sexual practices to reduce the spread of sexually transmitted infections (STIs). STIs include gonorrhea, chlamydia, syphilis, trichomonas, herpes, HPV, and human immunodeficiency virus (HIV). Herpes, HIV, and HPV are viral illnesses that have no cure. They can result in disability, cancer, and death.  You should be screened for sexually transmitted illnesses (STIs) including gonorrhea and chlamydia if:  You are sexually active and are younger than 24 years.  You are older than 24 years and your health care provider tells you that you are at risk for this type of infection.  Your sexual activity has changed since you were last screened and you are at an increased risk for chlamydia or gonorrhea. Ask your health care provider if you are at risk.  If you are at risk of being infected with HIV, it is recommended that you take a prescription medicine daily  to prevent HIV infection. This is called preexposure prophylaxis (PrEP). You are considered at risk if:  You are a heterosexual woman, are sexually active, and are at increased risk for HIV infection.  You take drugs by injection.  You are sexually active with a partner who has HIV.  Talk with your health care provider about whether you are at high risk of being infected with HIV. If you choose to begin PrEP, you should first be tested for HIV. You should then be tested every 3 months for as long as you are taking PrEP.  Osteoporosis is a disease  in which the bones lose minerals and strength with aging. This can result in serious bone fractures or breaks. The risk of osteoporosis can be identified using a bone density scan. Women ages 75 years and over and women at risk for fractures or osteoporosis should discuss screening with their health care providers. Ask your health care provider whether you should take a calcium supplement or vitamin D to reduce the rate of osteoporosis.  Menopause can be associated with physical symptoms and risks. Hormone replacement therapy is available to decrease symptoms and risks. You should talk to your health care provider about whether hormone replacement therapy is right for you.  Use sunscreen. Apply sunscreen liberally and repeatedly throughout the day. You should seek shade when your shadow is shorter than you. Protect yourself by wearing long sleeves, pants, a wide-brimmed hat, and sunglasses year round, whenever you are outdoors.  Once a month, do a whole body skin exam, using a mirror to look at the skin on your back. Tell your health care provider of new moles, moles that have irregular borders, moles that are larger than a pencil eraser, or moles that have changed in shape or color.  Stay current with required vaccines (immunizations).  Influenza vaccine. All adults should be immunized every year.  Tetanus, diphtheria, and acellular pertussis (Td,  Tdap) vaccine. Pregnant women should receive 1 dose of Tdap vaccine during each pregnancy. The dose should be obtained regardless of the length of time since the last dose. Immunization is preferred during the 27th-36th week of gestation. An adult who has not previously received Tdap or who does not know her vaccine status should receive 1 dose of Tdap. This initial dose should be followed by tetanus and diphtheria toxoids (Td) booster doses every 10 years. Adults with an unknown or incomplete history of completing a 3-dose immunization series with Td-containing vaccines should begin or complete a primary immunization series including a Tdap dose. Adults should receive a Td booster every 10 years.  Varicella vaccine. An adult without evidence of immunity to varicella should receive 2 doses or a second dose if she has previously received 1 dose. Pregnant females who do not have evidence of immunity should receive the first dose after pregnancy. This first dose should be obtained before leaving the health care facility. The second dose should be obtained 4-8 weeks after the first dose.  Human papillomavirus (HPV) vaccine. Females aged 13-26 years who have not received the vaccine previously should obtain the 3-dose series. The vaccine is not recommended for use in pregnant females. However, pregnancy testing is not needed before receiving a dose. If a female is found to be pregnant after receiving a dose, no treatment is needed. In that case, the remaining doses should be delayed until after the pregnancy. Immunization is recommended for any person with an immunocompromised condition through the age of 16 years if she did not get any or all doses earlier. During the 3-dose series, the second dose should be obtained 4-8 weeks after the first dose. The third dose should be obtained 24 weeks after the first dose and 16 weeks after the second dose.  Zoster vaccine. One dose is recommended for adults aged 34 years or  older unless certain conditions are present.  Measles, mumps, and rubella (MMR) vaccine. Adults born before [birth year] generally are considered immune to measles and mumps. Adults born in [birth year] or later should have 1 or more doses of MMR vaccine unless there is a contraindication to the vaccine  or there is laboratory evidence of immunity to each of the three diseases. A routine second dose of MMR vaccine should be obtained at least 28 days after the first dose for students attending postsecondary schools, health care workers, or international travelers. People who received inactivated measles vaccine or an unknown type of measles vaccine during 1963-1967 should receive 2 doses of MMR vaccine. People who received inactivated mumps vaccine or an unknown type of mumps vaccine before 1979 and are at high risk for mumps infection should consider immunization with 2 doses of MMR vaccine. For females of childbearing age, rubella immunity should be determined. If there is no evidence of immunity, females who are not pregnant should be vaccinated. If there is no evidence of immunity, females who are pregnant should delay immunization until after pregnancy. Unvaccinated health care workers born before [birth year] who lack laboratory evidence of measles, mumps, or rubella immunity or laboratory confirmation of disease should consider measles and mumps immunization with 2 doses of MMR vaccine or rubella immunization with 1 dose of MMR vaccine.  Pneumococcal 13-valent conjugate (PCV13) vaccine. When indicated, a person who is uncertain of her immunization history and has no record of immunization should receive the PCV13 vaccine. An adult aged 45 years or older who has certain medical conditions and has not been previously immunized should receive 1 dose of PCV13 vaccine. This PCV13 should be followed with a dose of pneumococcal polysaccharide (PPSV23) vaccine. The PPSV23 vaccine dose should be obtained at least 8 weeks after the dose of  PCV13 vaccine. An adult aged 85 years or older who has certain medical conditions and previously received 1 or more doses of PPSV23 vaccine should receive 1 dose of PCV13. The PCV13 vaccine dose should be obtained 1 or more years after the last PPSV23 vaccine dose.  Pneumococcal polysaccharide (PPSV23) vaccine. When PCV13 is also indicated, PCV13 should be obtained first. All adults aged 4 years and older should be immunized. An adult younger than age 45 years who has certain medical conditions should be immunized. Any person who resides in a nursing home or long-term care facility should be immunized. An adult smoker should be immunized. People with an immunocompromised condition and certain other conditions should receive both PCV13 and PPSV23 vaccines. People with human immunodeficiency virus (HIV) infection should be immunized as soon as possible after diagnosis. Immunization during chemotherapy or radiation therapy should be avoided. Routine use of PPSV23 vaccine is not recommended for American Indians, [place] Natives, or people younger than 65 years unless there are medical conditions that require PPSV23 vaccine. When indicated, people who have unknown immunization and have no record of immunization should receive PPSV23 vaccine. One-time revaccination 5 years after the first dose of PPSV23 is recommended for people aged 19-64 years who have chronic kidney failure, nephrotic syndrome, asplenia, or immunocompromised conditions. People who received 1-2 doses of PPSV23 before age 75 years should receive another dose of PPSV23 vaccine at age 80 years or later if at least 5 years have passed since the previous dose. Doses of PPSV23 are not needed for people immunized with PPSV23 at or after age 98 years.  Meningococcal vaccine. Adults with asplenia or persistent complement component deficiencies should receive 2 doses of quadrivalent meningococcal conjugate (MenACWY-D) vaccine. The doses should be obtained at  least 2 months apart. Microbiologists working with certain meningococcal bacteria, [employer] recruits, people at risk during an outbreak, and people who travel to or live in countries with a high rate of meningitis should be  first-year college student up through age 21 years who is living in a residence hall should receive a dose if she did not receive a dose on or after her 16th birthday. Adults who have certain high-risk conditions should receive one or more doses of vaccine.  Hepatitis A vaccine. Adults who wish to be protected from this disease, have certain high-risk conditions, work with hepatitis A-infected animals, work in hepatitis A research labs, or travel to or work in countries with a high rate of hepatitis A should be immunized. Adults who were previously unvaccinated and who anticipate close contact with an international adoptee during the first 60 days after arrival in the [place] from a country with a high rate of hepatitis A should be immunized.  Hepatitis B vaccine. Adults who wish to be protected from this disease, have certain high-risk conditions, may be exposed to blood or other infectious body fluids, are household contacts or sex partners of hepatitis B positive people, are clients or workers in certain care facilities, or travel to or work in countries with a high rate of hepatitis B should be immunized.  Haemophilus influenzae type b (Hib) vaccine. A previously unvaccinated person with asplenia or sickle cell disease or having a scheduled splenectomy should receive 1 dose of Hib vaccine. Regardless of previous immunization, a recipient of a hematopoietic stem cell transplant should receive a 3-dose series 6-12 months after her successful transplant. Hib vaccine is not recommended for adults with HIV infection. Preventive Services / Frequency Ages 19 to 39 years  Blood pressure check.** / Every 1 to 2 years.  Lipid and cholesterol check.** / Every 5 years  beginning at age 20.  Clinical breast exam.** / Every 3 years for women in their 20s and 30s.  BRCA-related cancer risk assessment.** / For women who have family members with a BRCA-related cancer (breast, ovarian, tubal, or peritoneal cancers).  Pap test.** / Every 2 years from ages 21 through 29. Every 3 years starting at age 30 through age 65 or 70 with a history of 3 consecutive normal Pap tests.  HPV screening.** / Every 3 years from ages 30 through ages 65 to 70 with a history of 3 consecutive normal Pap tests.  Hepatitis C blood test.** / For any individual with known risks for hepatitis C.  Skin self-exam. / Monthly.  Influenza vaccine. / Every year.  Tetanus, diphtheria, and acellular pertussis (Tdap, Td) vaccine.** / Consult your health care provider. Pregnant women should receive 1 dose of Tdap vaccine during each pregnancy. 1 dose of Td every 10 years.  Varicella vaccine.** / Consult your health care provider. Pregnant females who do not have evidence of immunity should receive the first dose after pregnancy.  HPV vaccine. / 3 doses over 6 months, if 26 and younger. The vaccine is not recommended for use in pregnant females. However, pregnancy testing is not needed before receiving a dose.  Measles, mumps, rubella (MMR) vaccine.** / You need at least 1 dose of MMR if you were born in 1957 or later. You may also need a 2nd dose. For females of childbearing age, rubella immunity should be determined. If there is no evidence of immunity, females who are not pregnant should be vaccinated. If there is no evidence of immunity, females who are pregnant should delay immunization until after pregnancy.  Pneumococcal 13-valent conjugate (PCV13) vaccine.** / Consult your health care provider.  Pneumococcal polysaccharide (PPSV23) vaccine.** / 1 to 2 doses if you smoke cigarettes or if you   have certain conditions.  Meningococcal vaccine.** / 1 dose if you are age 19 to 21 years and a  first-year college student living in a residence hall, or have one of several medical conditions, you need to get vaccinated against meningococcal disease. You may also need additional booster doses.  Hepatitis A vaccine.** / Consult your health care provider.  Hepatitis B vaccine.** / Consult your health care provider.  Haemophilus influenzae type b (Hib) vaccine.** / Consult your health care provider. Ages 40 to 64 years  Blood pressure check.** / Every 1 to 2 years.  Lipid and cholesterol check.** / Every 5 years beginning at age 20 years.  Lung cancer screening. / Every year if you are aged 55-80 years and have a 30-pack-year history of smoking and currently smoke or have quit within the past 15 years. Yearly screening is stopped once you have quit smoking for at least 15 years or develop a health problem that would prevent you from having lung cancer treatment.  Clinical breast exam.** / Every year after age 40 years.  BRCA-related cancer risk assessment.** / For women who have family members with a BRCA-related cancer (breast, ovarian, tubal, or peritoneal cancers).  Mammogram.** / Every year beginning at age 40 years and continuing for as long as you are in good health. Consult with your health care provider.  Pap test.** / Every 3 years starting at age 30 years through age 65 or 70 years with a history of 3 consecutive normal Pap tests.  HPV screening.** / Every 3 years from ages 30 years through ages 65 to 70 years with a history of 3 consecutive normal Pap tests.  Fecal occult blood test (FOBT) of stool. / Every year beginning at age 50 years and continuing until age 75 years. You may not need to do this test if you get a colonoscopy every 10 years.  Flexible sigmoidoscopy or colonoscopy.** / Every 5 years for a flexible sigmoidoscopy or every 10 years for a colonoscopy beginning at age 50 years and continuing until age 75 years.  Hepatitis C blood test.** / For all people  born from 1945 through 1965 and any individual with known risks for hepatitis C.  Skin self-exam. / Monthly.  Influenza vaccine. / Every year.  Tetanus, diphtheria, and acellular pertussis (Tdap/Td) vaccine.** / Consult your health care provider. Pregnant women should receive 1 dose of Tdap vaccine during each pregnancy. 1 dose of Td every 10 years.  Varicella vaccine.** / Consult your health care provider. Pregnant females who do not have evidence of immunity should receive the first dose after pregnancy.  Zoster vaccine.** / 1 dose for adults aged 60 years or older.  Measles, mumps, rubella (MMR) vaccine.** / You need at least 1 dose of MMR if you were born in 1957 or later. You may also need a 2nd dose. For females of childbearing age, rubella immunity should be determined. If there is no evidence of immunity, females who are not pregnant should be vaccinated. If there is no evidence of immunity, females who are pregnant should delay immunization until after pregnancy.  Pneumococcal 13-valent conjugate (PCV13) vaccine.** / Consult your health care provider.  Pneumococcal polysaccharide (PPSV23) vaccine.** / 1 to 2 doses if you smoke cigarettes or if you have certain conditions.  Meningococcal vaccine.** / Consult your health care provider.  Hepatitis A vaccine.** / Consult your health care provider.  Hepatitis B vaccine.** / Consult your health care provider.  Haemophilus influenzae type b (Hib) vaccine.** /   Consult your health care provider. Ages 65 years and over  Blood pressure check.** / Every 1 to 2 years.  Lipid and cholesterol check.** / Every 5 years beginning at age 20 years.  Lung cancer screening. / Every year if you are aged 55-80 years and have a 30-pack-year history of smoking and currently smoke or have quit within the past 15 years. Yearly screening is stopped once you have quit smoking for at least 15 years or develop a health problem that would prevent you from  having lung cancer treatment.  Clinical breast exam.** / Every year after age 40 years.  BRCA-related cancer risk assessment.** / For women who have family members with a BRCA-related cancer (breast, ovarian, tubal, or peritoneal cancers).  Mammogram.** / Every year beginning at age 40 years and continuing for as long as you are in good health. Consult with your health care provider.  Pap test.** / Every 3 years starting at age 30 years through age 65 or 70 years with 3 consecutive normal Pap tests. Testing can be stopped between 65 and 70 years with 3 consecutive normal Pap tests and no abnormal Pap or HPV tests in the past 10 years.  HPV screening.** / Every 3 years from ages 30 years through ages 65 or 70 years with a history of 3 consecutive normal Pap tests. Testing can be stopped between 65 and 70 years with 3 consecutive normal Pap tests and no abnormal Pap or HPV tests in the past 10 years.  Fecal occult blood test (FOBT) of stool. / Every year beginning at age 50 years and continuing until age 75 years. You may not need to do this test if you get a colonoscopy every 10 years.  Flexible sigmoidoscopy or colonoscopy.** / Every 5 years for a flexible sigmoidoscopy or every 10 years for a colonoscopy beginning at age 50 years and continuing until age 75 years.  Hepatitis C blood test.** / For all people born from 1945 through 1965 and any individual with known risks for hepatitis C.  Osteoporosis screening.** / A one-time screening for women ages 65 years and over and women at risk for fractures or osteoporosis.  Skin self-exam. / Monthly.  Influenza vaccine. / Every year.  Tetanus, diphtheria, and acellular pertussis (Tdap/Td) vaccine.** / 1 dose of Td every 10 years.  Varicella vaccine.** / Consult your health care provider.  Zoster vaccine.** / 1 dose for adults aged 60 years or older.  Pneumococcal 13-valent conjugate (PCV13) vaccine.** / Consult your health care  provider.  Pneumococcal polysaccharide (PPSV23) vaccine.** / 1 dose for all adults aged 65 years and older.  Meningococcal vaccine.** / Consult your health care provider.  Hepatitis A vaccine.** / Consult your health care provider.  Hepatitis B vaccine.** / Consult your health care provider.  Haemophilus influenzae type b (Hib) vaccine.** / Consult your health care provider. ** Family history and personal history of risk and conditions may change your health care provider's recommendations. Document Released: 10/06/2001 Document Revised: 12/25/2013 Document Reviewed: 01/05/2011 Exit[employer] Patient Information 2015 [employer]. This information is not intended to replace advice given to you by your health care provider. Make sure you discuss any questions you have with your health care provider.   

## 2014-09-24 NOTE — Assessment & Plan Note (Signed)

## 2014-09-24 NOTE — Assessment & Plan Note (Addendum)
Rapid strep Prom-cod syr Rx prn

## 2014-09-24 NOTE — Progress Notes (Signed)
   Subjective:   HPI  The patient is here for a wellness exam. The patient has been doing well overall without major physical or psychological issues going on lately, except for some insomnia  C/o URI sx's x 2 d  Review of Systems  Constitutional: Negative for fever, chills, diaphoresis, activity change, appetite change, fatigue and unexpected weight change.  HENT: Negative for congestion, dental problem, ear pain, hearing loss, mouth sores, postnasal drip, sinus pressure, sneezing, sore throat and voice change.   Eyes: Negative for pain and visual disturbance.  Respiratory: Negative for cough, chest tightness, wheezing and stridor.   Cardiovascular: Negative for chest pain, palpitations and leg swelling.  Gastrointestinal: Negative for nausea, vomiting, abdominal pain, blood in stool, abdominal distention and rectal pain.  Genitourinary: Negative for dysuria, hematuria, decreased urine volume, vaginal bleeding, vaginal discharge, difficulty urinating, vaginal pain and menstrual problem.  Musculoskeletal: Negative for back pain, joint swelling, gait problem and neck pain.  Skin: Negative for color change, rash and wound.  Neurological: Negative for dizziness, tremors, syncope, speech difficulty and light-headedness.  Hematological: Negative for adenopathy.  Psychiatric/Behavioral: Negative for suicidal ideas, hallucinations, behavioral problems, confusion, sleep disturbance, dysphoric mood and decreased concentration. The patient is not hyperactive.    BP Readings from Last 3 Encounters:  09/24/14 138/94  09/12/13 130/90  02/17/13 138/76   Wt Readings from Last 3 Encounters:  09/24/14 158 lb (71.668 kg)  09/12/13 153 lb (69.4 kg)  02/17/13 154 lb 1.3 oz (69.89 kg)       Objective:   Physical Exam  Constitutional: She appears well-developed. No distress.  HENT:  Head: Normocephalic.  Right Ear: External ear normal.  Left Ear: External ear normal.  Nose: Nose normal.   Mouth/Throat: Oropharynx is clear and moist.  Eyes: Conjunctivae are normal. Pupils are equal, round, and reactive to light. Right eye exhibits no discharge. Left eye exhibits no discharge.  Neck: Normal range of motion. Neck supple. No JVD present. No tracheal deviation present. No thyromegaly present.  Cardiovascular: Normal rate, regular rhythm and normal heart sounds.   Pulmonary/Chest: No stridor. No respiratory distress. She has no wheezes.  Abdominal: Soft. Bowel sounds are normal. She exhibits no distension and no mass. There is no tenderness. There is no rebound and no guarding.  Musculoskeletal: She exhibits no edema or tenderness.  Lymphadenopathy:    She has no cervical adenopathy.  Neurological: She displays normal reflexes. No cranial nerve deficit. She exhibits normal muscle tone. Coordination normal.  Skin: No rash noted. No erythema.  Psychiatric: She has a normal mood and affect. Her behavior is normal. Judgment and thought content normal.  Eryth throat  Lab Results  Component Value Date   WBC 7.2 09/21/2013   HGB 13.5 09/21/2013   HCT 41.0 09/21/2013   PLT 318.0 09/21/2013   GLUCOSE 96 09/21/2013   CHOL 241* 09/21/2013   TRIG 122.0 09/21/2013   HDL 53.80 09/21/2013   LDLDIRECT 165.3 09/21/2013   ALT 14 09/21/2013   AST 20 09/21/2013   NA 141 09/21/2013   K 4.0 09/21/2013   CL 107 09/21/2013   CREATININE 1.0 09/21/2013   BUN 17 09/21/2013   CO2 28 09/21/2013   TSH 1.07 09/21/2013     Strep swab (-)     Assessment & Plan:

## 2014-09-24 NOTE — Progress Notes (Signed)
Pre visit review using our clinic review tool, if applicable. No additional management support is needed unless otherwise documented below in the visit note. 

## 2014-09-25 ENCOUNTER — Telehealth: Payer: Self-pay | Admitting: Internal Medicine

## 2014-09-25 NOTE — Telephone Encounter (Signed)
emmi emailed °

## 2014-10-05 ENCOUNTER — Other Ambulatory Visit (INDEPENDENT_AMBULATORY_CARE_PROVIDER_SITE_OTHER): Payer: Medicare Other

## 2014-10-05 DIAGNOSIS — E538 Deficiency of other specified B group vitamins: Secondary | ICD-10-CM

## 2014-10-05 DIAGNOSIS — M810 Age-related osteoporosis without current pathological fracture: Secondary | ICD-10-CM

## 2014-10-05 DIAGNOSIS — J069 Acute upper respiratory infection, unspecified: Secondary | ICD-10-CM

## 2014-10-05 DIAGNOSIS — I10 Essential (primary) hypertension: Secondary | ICD-10-CM

## 2014-10-05 DIAGNOSIS — Z0000 Encounter for general adult medical examination without abnormal findings: Secondary | ICD-10-CM

## 2014-10-05 LAB — CBC WITH DIFFERENTIAL/PLATELET
BASOS ABS: 0 10*3/uL (ref 0.0–0.1)
Basophils Relative: 0.7 % (ref 0.0–3.0)
EOS PCT: 1 % (ref 0.0–5.0)
Eosinophils Absolute: 0.1 10*3/uL (ref 0.0–0.7)
HEMATOCRIT: 38.6 % (ref 36.0–46.0)
Hemoglobin: 13.1 g/dL (ref 12.0–15.0)
LYMPHS ABS: 2.5 10*3/uL (ref 0.7–4.0)
Lymphocytes Relative: 34.5 % (ref 12.0–46.0)
MCHC: 34 g/dL (ref 30.0–36.0)
MCV: 89.2 fl (ref 78.0–100.0)
Monocytes Absolute: 0.5 10*3/uL (ref 0.1–1.0)
Monocytes Relative: 6.5 % (ref 3.0–12.0)
NEUTROS ABS: 4.2 10*3/uL (ref 1.4–7.7)
Neutrophils Relative %: 57.3 % (ref 43.0–77.0)
PLATELETS: 332 10*3/uL (ref 150.0–400.0)
RBC: 4.32 Mil/uL (ref 3.87–5.11)
RDW: 13.6 % (ref 11.5–15.5)
WBC: 7.3 10*3/uL (ref 4.0–10.5)

## 2014-10-05 LAB — LIPID PANEL
CHOL/HDL RATIO: 5
Cholesterol: 253 mg/dL — ABNORMAL HIGH (ref 0–200)
HDL: 50.5 mg/dL (ref >39.00)
LDL Cholesterol: 179 mg/dL — ABNORMAL HIGH (ref 0–99)
NONHDL: 202.5
Triglycerides: 118 mg/dL (ref 0.0–149.0)
VLDL: 23.6 mg/dL (ref 0.0–40.0)

## 2014-10-05 LAB — BASIC METABOLIC PANEL
BUN: 15 mg/dL (ref 6–23)
CO2: 29 mEq/L (ref 19–32)
CREATININE: 1.05 mg/dL (ref 0.40–1.20)
Calcium: 10 mg/dL (ref 8.4–10.5)
Chloride: 107 mEq/L (ref 96–112)
GFR: 67.49 mL/min (ref >60.00)
Glucose, Bld: 99 mg/dL (ref 70–99)
POTASSIUM: 4.2 meq/L (ref 3.5–5.1)
Sodium: 142 mEq/L (ref 135–145)

## 2014-10-05 LAB — URINALYSIS
Bilirubin Urine: NEGATIVE
Hgb urine dipstick: NEGATIVE
Ketones, ur: NEGATIVE
Leukocytes, UA: NEGATIVE
NITRITE: NEGATIVE
PH: 6 (ref 5.0–8.0)
TOTAL PROTEIN, URINE-UPE24: NEGATIVE
Urine Glucose: NEGATIVE
Urobilinogen, UA: 0.2 (ref 0.0–1.0)

## 2014-10-05 LAB — VITAMIN D 25 HYDROXY (VIT D DEFICIENCY, FRACTURES): VITD: 31.39 ng/mL (ref 30.00–100.00)

## 2014-10-05 LAB — HEPATIC FUNCTION PANEL
ALBUMIN: 4 g/dL (ref 3.5–5.2)
ALT: 39 U/L — AB (ref 0–35)
AST: 28 U/L (ref 0–37)
Alkaline Phosphatase: 72 U/L (ref 39–117)
Bilirubin, Direct: 0.1 mg/dL (ref 0.0–0.3)
TOTAL PROTEIN: 7.6 g/dL (ref 6.0–8.3)
Total Bilirubin: 0.5 mg/dL (ref 0.2–1.2)

## 2014-10-05 LAB — TSH: TSH: 1.17 u[IU]/mL (ref 0.35–4.50)

## 2014-10-05 LAB — VITAMIN B12: Vitamin B-12: 683 pg/mL (ref 211–911)

## 2014-11-14 ENCOUNTER — Ambulatory Visit (INDEPENDENT_AMBULATORY_CARE_PROVIDER_SITE_OTHER): Payer: Medicare Other | Admitting: Internal Medicine

## 2014-11-14 ENCOUNTER — Encounter: Payer: Self-pay | Admitting: Internal Medicine

## 2014-11-14 ENCOUNTER — Ambulatory Visit (INDEPENDENT_AMBULATORY_CARE_PROVIDER_SITE_OTHER)
Admission: RE | Admit: 2014-11-14 | Discharge: 2014-11-14 | Disposition: A | Discharge status: Home / Self Care | Payer: Medicare Other | Source: Ambulatory Visit | Attending: Internal Medicine | Admitting: Internal Medicine

## 2014-11-14 VITALS — BP 128/80 | HR 72 | Temp 98.0°F | Resp 15 | Ht 65.0 in | Wt 159.2 lb

## 2014-11-14 DIAGNOSIS — R1013 Epigastric pain: Secondary | ICD-10-CM | POA: Diagnosis not present

## 2014-11-14 DIAGNOSIS — Z8249 Family history of ischemic heart disease and other diseases of the circulatory system: Secondary | ICD-10-CM

## 2014-11-14 DIAGNOSIS — K5730 Diverticulosis of large intestine without perforation or abscess without bleeding: Secondary | ICD-10-CM | POA: Insufficient documentation

## 2014-11-14 DIAGNOSIS — E785 Hyperlipidemia, unspecified: Secondary | ICD-10-CM

## 2014-11-14 DIAGNOSIS — R1310 Dysphagia, unspecified: Secondary | ICD-10-CM

## 2014-11-14 DIAGNOSIS — M546 Pain in thoracic spine: Secondary | ICD-10-CM

## 2014-11-14 MED ORDER — RANITIDINE HCL 150 MG PO TABS: 150.0000 mg | ORAL_TABLET | Freq: Two times a day (BID) | ORAL | Status: DC

## 2014-11-14 NOTE — Patient Instructions (Signed)
Reflux of gastric acid may be asymptomatic as this may occur mainly during sleep.The triggers for reflux  include stress; the "aspirin family" ; alcohol; peppermint; and caffeine (coffee, tea, cola, and chocolate). The aspirin family would include aspirin and the nonsteroidal agents such as ibuprofen &  Naproxen. Tylenol would not cause reflux. If having symptoms ; food & drink should be avoided for @ least 2 hours before going to bed.  The GI referral will be scheduled and you'll be notified of the time.Please call the Referral Co-Ordinator @ [phone] if you have not been notified of appointment time within 7-10 days.   Your next office appointment will be determined based upon review of your pending x-rays. Those instructions will be transmitted to you through My Chart .   Critical results will be called. Followup as needed for any active or acute issue. Please report any significant change in your symptoms.  An advanced cholesterol panel (NMR Lipoprofile Lipid Panel)  is best way to optimally assess long LDL risk.

## 2014-11-14 NOTE — Progress Notes (Signed)
   Subjective:    Patient ID: Joann Fields, female    DOB: 1951-04-11, 64 y.o.   MRN: [id number]  HPI  Her symptoms began 11/07/14 as indigestion intermittently. This even disturbs sleep. She also describes dysphagia approximately 2 times a week. She has burning pain in the epigastric area with some radiation to the back. The severity is 5-8 and can last minutes-hours. Symptoms are worse when supine. There is no associated nausea vomiting, constipation, or diarrhea.  She has 1 cup of tea a day and drinks ginger ale. She has pepermints daily. She rarely drinks alcohol. She avoids aspirin or nonsteroidals. She does not smoke.  She's been using Pepto-Bismol with limited benefit.  Colonoscopy 2008 revealed diverticulosis.  She has history of B12 deficiency and takes an oral supplement.  She's had extensive labs done last month. The AST was minimally elevated at 39. Her LDL was 179. It is ranged from 166.7-19 no 0.3. She states that she would not take a statin. Her father did have a myocardial infarction age 10.  She does perform aerobics 45 minutes or walks 30-60 minutes several times a week without symptoms.  She has no other cardiac, respiratory, GI, or genitourinary symptoms. Stools were dark while on Pepto-Bismol.  Review of Systems  Chest pain, palpitations, tachycardia, exertional dyspnea, paroxysmal nocturnal dyspnea, claudication or edema are absent.  Unexplained weight loss,  melena, rectal bleeding, or persistently small caliber stools are denied.     Objective:   Physical Exam  Pertinent or positive findings include: Arcus senilis is present.  There is minimal erythema of the nasal mucosa. She has a grade 1/2 systolic murmur.  Slight scoliosis suggested by the asymmetry of the thoracic musculature.   General appearance :adequately nourished; in no distress. Eyes: No conjunctival inflammation or scleral icterus is present. Oral exam:  Lips and gums are healthy  appearing.There is no oropharyngeal erythema or exudate noted. Dental hygiene is good. Heart:  Normal rate and regular rhythm. S1 and S2 normal without gallop, click, rub or other extra sounds   Lungs:Chest clear to auscultation; no wheezes, rhonchi,rales ,or rubs present.No increased work of breathing. Abdomen: bowel sounds normal, soft and non-tender without masses, organomegaly or hernias noted.  No guarding or rebound. Vascular : all pulses equal ; no bruits present. Skin:Warm & dry.  Intact without suspicious lesions or rashes ; no tenting  Lymphatic: No lymphadenopathy is noted about the head, neck, axilla Neuro: Strength, tone & DTRs normal.        Assessment & Plan:  #1 epigastric pain with posterior radiation   #2 dysphagia  #3 dyslipidemia with family history of premature coronary disease  Plan: See orders and after visit summary

## 2014-11-14 NOTE — Progress Notes (Signed)
Pre visit review using our clinic review tool, if applicable. No additional management support is needed unless otherwise documented below in the visit note. 

## 2014-11-18 ENCOUNTER — Other Ambulatory Visit: Payer: Self-pay | Admitting: Internal Medicine

## 2014-12-11 ENCOUNTER — Other Ambulatory Visit: Payer: Medicare Other

## 2014-12-11 ENCOUNTER — Other Ambulatory Visit: Payer: Self-pay | Admitting: Internal Medicine

## 2014-12-11 DIAGNOSIS — Z8249 Family history of ischemic heart disease and other diseases of the circulatory system: Secondary | ICD-10-CM

## 2014-12-11 DIAGNOSIS — E785 Hyperlipidemia, unspecified: Secondary | ICD-10-CM

## 2014-12-13 LAB — NMR LIPOPROFILE WITH LIPIDS
Cholesterol, Total: 241 mg/dL — ABNORMAL HIGH (ref 100–199)
HDL PARTICLE NUMBER: 34.7 umol/L (ref >=30.5)
HDL Size: 8.5 nm — ABNORMAL LOW (ref >=9.2)
HDL-C: 57 mg/dL (ref >39)
LDL (calc): 165 mg/dL — ABNORMAL HIGH (ref 0–99)
LDL Particle Number: 1926 nmol/L — ABNORMAL HIGH (ref <1000)
LDL SIZE: 20.3 nm (ref >=20.8)
LP-IR SCORE: 44 (ref <=45)
Large HDL-P: 3.2 umol/L — ABNORMAL LOW (ref >=4.8)
Large VLDL-P: <0.8 nmol/L (ref <=2.7)
Small LDL Particle Number: 1057 nmol/L — ABNORMAL HIGH (ref <=527)
Triglycerides: 96 mg/dL (ref 0–149)
VLDL Size: 38.5 nm (ref <=46.6)

## 2014-12-14 ENCOUNTER — Encounter: Payer: Self-pay | Admitting: *Deleted

## 2015-01-11 ENCOUNTER — Ambulatory Visit (INDEPENDENT_AMBULATORY_CARE_PROVIDER_SITE_OTHER): Payer: Medicare Other | Admitting: Gastroenterology

## 2015-01-11 ENCOUNTER — Encounter: Payer: Self-pay | Admitting: Gastroenterology

## 2015-01-11 VITALS — BP 130/82 | HR 76 | Ht 64.75 in | Wt 159.0 lb

## 2015-01-11 DIAGNOSIS — K219 Gastro-esophageal reflux disease without esophagitis: Secondary | ICD-10-CM

## 2015-01-11 NOTE — Progress Notes (Signed)
Review of pertinent gastrointestinal problems: 1. Routine risk for colon cancer;  Colonoscopy 10/2006 Dr. Ardis Hughs for routine screening found no polyps, + left sided diverticulosis.  Recommended recall at 10 years.   HPI: This is a  very pleasant 64 year old woman who was referred to me by Hendricks Limes, MD  to evaluate  GERD-like symptoms .    Chief complaint is pyrosis, mild chest pressure  Was having back pain, epigastric burning (was going on for about 2 weeks).    Her pcp gave her randitidine for ? GERD, one pill bid and this has helped.  She still has some mild pressure burning-like sensation but overall is much better  No dysphagia.  Overall stable weight.  No overt GI bleeding.  Cup of tea daily.  No choc, but eats many peppermint candies per day.     Review of systems: Pertinent positive and negative review of systems were noted in the above HPI section. Complete review of systems was performed and was otherwise normal.   Past Medical History  Diagnosis Date  . Hypertension   . Hyperlipidemia     Past Surgical History  Procedure Laterality Date  . Wisdom tooth extraction      Current Outpatient Prescriptions  Medication Sig Dispense Refill  . cholecalciferol (VITAMIN D) 1000 UNITS tablet Take 1 tablet (1,000 Units total) by mouth daily. 100 tablet 3  . Cyanocobalamin (VITAMIN B-12) 1000 MCG SUBL Place 1 tablet (1,000 mcg total) under the tongue daily. (Patient taking differently: Place 1 tablet under the tongue daily. Pt takes OTC brand) 100 tablet 3  . DILT-XR 180 MG 24 hr capsule TAKE 1 CAPSULE BY MOUTH EVERY DAY 90 capsule 0  . ranitidine (ZANTAC) 150 MG tablet Take 1 tablet (150 mg total) by mouth 2 (two) times daily. 60 tablet 2   No current facility-administered medications for this visit.    Allergies as of 01/11/2015 - Review Complete 01/11/2015  Allergen Reaction Noted  . Amoxicillin Rash 09/12/2013  . Loratadine Rash 09/12/2013    Family History   Problem Relation Age of Onset  . Lung cancer Mother 30  . Heart disease Father 26    MI  . Alzheimer's disease Brother 57  . Early death Maternal Aunt   . Hearing loss Maternal Aunt   . Colon cancer Neg Hx   . Lymphoma Sister   . Colon polyps Neg Hx   . Esophageal cancer Neg Hx     History   Social History  . Marital Status: Single    Spouse Name: N/A  . Number of Children: 2  . Years of Education: N/A   Occupational History  . Retired    Social History Main Topics  . Smoking status: Never Smoker   . Smokeless tobacco: Never Used     Comment: only in [address]  . Alcohol Use: 0.0 oz/week    0 Standard drinks or equivalent per week     Comment: Rarely  . Drug Use: No  . Sexual Activity: Not on file   Other Topics Concern  . Not on file   Social History Narrative     Physical Exam: BP 130/82 mmHg  Pulse 76  Ht 5' 4.75" (1.645 m)  Wt 159 lb (72.122 kg)  BMI 26.65 kg/m2 Constitutional: generally well-appearing Psychiatric: alert and oriented x3 Eyes: extraocular movements intact Mouth: oral pharynx moist, no lesions Neck: supple no lymphadenopathy Cardiovascular: heart regular rate and rhythm Lungs: clear to auscultation bilaterally Abdomen: soft, nontender,  nondistended, no obvious ascites, no peritoneal signs, normal bowel sounds Extremities: no lower extremity edema bilaterally Skin: no lesions on visible extremities   Assessment and plan: 64 y.o. female with  GERD-like symptoms without alarm symptoms  She has no dysphagia, no weight loss unexplained, no overt GI bleeding to warrant upper endoscopy at this point. Her GERD-like symptoms are relatively new as well. Her symptoms have improved significantly on H2 blocker. I recommended she change the way she is taking her ranitidine so that she takes one pill at bedtime every night, that was the timing of her most often burning, pressure. She had has been eating a lot of peppermints and she knows now that can  also contribute to GERD. She will try to cut back on those. She will call to report on her response to these modifications for 5 weeks and sooner if needed.   Owens Loffler, MD [facility] 01/11/2015, 10:02 AM  Cc: Hendricks Limes, MD

## 2015-01-11 NOTE — Patient Instructions (Addendum)
Change your ranitidine dosing, take 150mg  pill at bedtime every night. Try cutting back on peppermints (these can worsen heartburn). Call Dr. Ardis Hughs' office in 4-5 weeks to report on your symptoms.

## 2015-03-05 ENCOUNTER — Telehealth: Payer: Self-pay | Admitting: Internal Medicine

## 2015-03-05 MED ORDER — DILTIAZEM HCL ER 180 MG PO CP24: 180.0000 mg | ORAL_CAPSULE | Freq: Every day | ORAL | Status: DC

## 2015-03-05 NOTE — Telephone Encounter (Signed)
Patient needs refill request for DILT-XR 180 MG 24 hr capsule [[id number] Pharmacy [facility] [address].

## 2015-03-18 ENCOUNTER — Telehealth: Payer: Self-pay | Admitting: Gastroenterology

## 2015-03-19 NOTE — Telephone Encounter (Signed)
Glad to hear, she should continue the ranitidine nightly or every other night.  Thanks

## 2015-03-19 NOTE — Telephone Encounter (Signed)
Pt feels much better states the ranitidine is working.

## 2015-03-19 NOTE — Telephone Encounter (Signed)
Pt aware.

## 2015-03-25 ENCOUNTER — Other Ambulatory Visit: Payer: Self-pay | Admitting: Internal Medicine

## 2015-05-31 ENCOUNTER — Other Ambulatory Visit: Payer: Self-pay | Admitting: Internal Medicine

## 2015-09-27 ENCOUNTER — Ambulatory Visit (INDEPENDENT_AMBULATORY_CARE_PROVIDER_SITE_OTHER): Payer: Medicare Other | Admitting: Internal Medicine

## 2015-09-27 ENCOUNTER — Encounter: Payer: Self-pay | Admitting: Internal Medicine

## 2015-09-27 VITALS — BP 130/80 | HR 74 | Ht 65.0 in | Wt 155.0 lb

## 2015-09-27 DIAGNOSIS — Z0000 Encounter for general adult medical examination without abnormal findings: Secondary | ICD-10-CM

## 2015-09-27 DIAGNOSIS — Z23 Encounter for immunization: Secondary | ICD-10-CM | POA: Diagnosis not present

## 2015-09-27 MED ORDER — DILTIAZEM HCL ER 180 MG PO CP24: ORAL_CAPSULE | ORAL | Status: DC

## 2015-09-27 MED ORDER — VITAMIN B-12 1000 MCG SL SUBL: 1.0000 | SUBLINGUAL_TABLET | Freq: Every day | SUBLINGUAL | Status: DC

## 2015-09-27 MED ORDER — VITAMIN D 1000 UNITS PO TABS: 1000.0000 [IU] | ORAL_TABLET | Freq: Every day | ORAL | Status: AC

## 2015-09-27 NOTE — Progress Notes (Signed)
Pre visit review using our clinic review tool, if applicable. No additional management support is needed unless otherwise documented below in the visit note. 

## 2015-09-27 NOTE — Patient Instructions (Signed)
Preventive Care for Adults, Female A healthy lifestyle and preventive care can promote health and wellness. Preventive health guidelines for women include the following key practices.  A routine yearly physical is a good way to check with your health care provider about your health and preventive screening. It is a chance to share any concerns and updates on your health and to receive a thorough exam.  Visit your dentist for a routine exam and preventive care every 6 months. Brush your teeth twice a day and floss once a day. Good oral hygiene prevents tooth decay and gum disease.  The frequency of eye exams is based on your age, health, family medical history, use of contact lenses, and other factors. Follow your health care provider's recommendations for frequency of eye exams.  Eat a healthy diet. Foods like vegetables, fruits, whole grains, low-fat dairy products, and lean protein foods contain the nutrients you need without too many calories. Decrease your intake of foods high in solid fats, added sugars, and salt. Eat the right amount of calories for you.Get information about a proper diet from your health care provider, if necessary.  Regular physical exercise is one of the most important things you can do for your health. Most adults should get at least 150 minutes of moderate-intensity exercise (any activity that increases your heart rate and causes you to sweat) each week. In addition, most adults need muscle-strengthening exercises on 2 or more days a week.  Maintain a healthy weight. The body mass index (BMI) is a screening tool to identify possible weight problems. It provides an estimate of body fat based on height and weight. Your health care provider can find your BMI and can help you achieve or maintain a healthy weight.For adults 20 years and older:  A BMI below 18.5 is considered underweight.  A BMI of 18.5 to 24.9 is normal.  A BMI of 25 to 29.9 is considered overweight.  A  BMI of 30 and above is considered obese.  Maintain normal blood lipids and cholesterol levels by exercising and minimizing your intake of saturated fat. Eat a balanced diet with plenty of fruit and vegetables. Blood tests for lipids and cholesterol should begin at age 45 and be repeated every 5 years. If your lipid or cholesterol levels are high, you are over 50, or you are at high risk for heart disease, you may need your cholesterol levels checked more frequently.Ongoing high lipid and cholesterol levels should be treated with medicines if diet and exercise are not working.  If you smoke, find out from your health care provider how to quit. If you do not use tobacco, do not start.  Lung cancer screening is recommended for adults aged 45-80 years who are at high risk for developing lung cancer because of a history of smoking. A yearly low-dose CT scan of the lungs is recommended for people who have at least a 30-pack-year history of smoking and are a current smoker or have quit within the past 15 years. A pack year of smoking is smoking an average of 1 pack of cigarettes a day for 1 year (for example: 1 pack a day for 30 years or 2 packs a day for 15 years). Yearly screening should continue until the smoker has stopped smoking for at least 15 years. Yearly screening should be stopped for people who develop a health problem that would prevent them from having lung cancer treatment.  If you are pregnant, do not drink alcohol. If you are  breastfeeding, be very cautious about drinking alcohol. If you are not pregnant and choose to drink alcohol, do not have more than 1 drink per day. One drink is considered to be 12 ounces (355 mL) of beer, 5 ounces (148 mL) of wine, or 1.5 ounces (44 mL) of liquor.  Avoid use of street drugs. Do not share needles with anyone. Ask for help if you need support or instructions about stopping the use of drugs.  High blood pressure causes heart disease and increases the risk  of stroke. Your blood pressure should be checked at least every 1 to 2 years. Ongoing high blood pressure should be treated with medicines if weight loss and exercise do not work.  If you are 55-79 years old, ask your health care provider if you should take aspirin to prevent strokes.  Diabetes screening is done by taking a blood sample to check your blood glucose level after you have not eaten for a certain period of time (fasting). If you are not overweight and you do not have risk factors for diabetes, you should be screened once every 3 years starting at age 45. If you are overweight or obese and you are 40-70 years of age, you should be screened for diabetes every year as part of your cardiovascular risk assessment.  Breast cancer screening is essential preventive care for women. You should practice "breast self-awareness." This means understanding the normal appearance and feel of your breasts and may include breast self-examination. Any changes detected, no matter how small, should be reported to a health care provider. Women in their 20s and 30s should have a clinical breast exam (CBE) by a health care provider as part of a regular health exam every 1 to 3 years. After age 40, women should have a CBE every year. Starting at age 40, women should consider having a mammogram (breast X-ray test) every year. Women who have a family history of breast cancer should talk to their health care provider about genetic screening. Women at a high risk of breast cancer should talk to their health care providers about having an MRI and a mammogram every year.  Breast cancer gene (BRCA)-related cancer risk assessment is recommended for women who have family members with BRCA-related cancers. BRCA-related cancers include breast, ovarian, tubal, and peritoneal cancers. Having family members with these cancers may be associated with an increased risk for harmful changes (mutations) in the breast cancer genes BRCA1 and  BRCA2. Results of the assessment will determine the need for genetic counseling and BRCA1 and BRCA2 testing.  Your health care provider may recommend that you be screened regularly for cancer of the pelvic organs (ovaries, uterus, and vagina). This screening involves a pelvic examination, including checking for microscopic changes to the surface of your cervix (Pap test). You may be encouraged to have this screening done every 3 years, beginning at age 21.  For women ages 30-65, health care providers may recommend pelvic exams and Pap testing every 3 years, or they may recommend the Pap and pelvic exam, combined with testing for human papilloma virus (HPV), every 5 years. Some types of HPV increase your risk of cervical cancer. Testing for HPV may also be done on women of any age with unclear Pap test results.  Other health care providers may not recommend any screening for nonpregnant women who are considered low risk for pelvic cancer and who do not have symptoms. Ask your health care provider if a screening pelvic exam is right for   you.  If you have had past treatment for cervical cancer or a condition that could lead to cancer, you need Pap tests and screening for cancer for at least 20 years after your treatment. If Pap tests have been discontinued, your risk factors (such as having a new sexual partner) need to be reassessed to determine if screening should resume. Some women have medical problems that increase the chance of getting cervical cancer. In these cases, your health care provider may recommend more frequent screening and Pap tests.  Colorectal cancer can be detected and often prevented. Most routine colorectal cancer screening begins at the age of 50 years and continues through age 75 years. However, your health care provider may recommend screening at an earlier age if you have risk factors for colon cancer. On a yearly basis, your health care provider may provide home test kits to check  for hidden blood in the stool. Use of a small camera at the end of a tube, to directly examine the colon (sigmoidoscopy or colonoscopy), can detect the earliest forms of colorectal cancer. Talk to your health care provider about this at age 50, when routine screening begins. Direct exam of the colon should be repeated every 5-10 years through age 75 years, unless early forms of precancerous polyps or small growths are found.  People who are at an increased risk for hepatitis B should be screened for this virus. You are considered at high risk for hepatitis B if:  You were born in a country where hepatitis B occurs often. Talk with your health care provider about which countries are considered high risk.  Your parents were born in a high-risk country and you have not received a shot to protect against hepatitis B (hepatitis B vaccine).  You have HIV or AIDS.  You use needles to inject street drugs.  You live with, or have sex with, someone who has hepatitis B.  You get hemodialysis treatment.  You take certain medicines for conditions like cancer, organ transplantation, and autoimmune conditions.  Hepatitis C blood testing is recommended for all people born from 1945 through 1965 and any individual with known risks for hepatitis C.  Practice safe sex. Use condoms and avoid high-risk sexual practices to reduce the spread of sexually transmitted infections (STIs). STIs include gonorrhea, chlamydia, syphilis, trichomonas, herpes, HPV, and human immunodeficiency virus (HIV). Herpes, HIV, and HPV are viral illnesses that have no cure. They can result in disability, cancer, and death.  You should be screened for sexually transmitted illnesses (STIs) including gonorrhea and chlamydia if:  You are sexually active and are younger than 24 years.  You are older than 24 years and your health care provider tells you that you are at risk for this type of infection.  Your sexual activity has changed  since you were last screened and you are at an increased risk for chlamydia or gonorrhea. Ask your health care provider if you are at risk.  If you are at risk of being infected with HIV, it is recommended that you take a prescription medicine daily to prevent HIV infection. This is called preexposure prophylaxis (PrEP). You are considered at risk if:  You are sexually active and do not regularly use condoms or know the HIV status of your partner(s).  You take drugs by injection.  You are sexually active with a partner who has HIV.  Talk with your health care provider about whether you are at high risk of being infected with HIV. If   you choose to begin PrEP, you should first be tested for HIV. You should then be tested every 3 months for as long as you are taking PrEP.  Osteoporosis is a disease in which the bones lose minerals and strength with aging. This can result in serious bone fractures or breaks. The risk of osteoporosis can be identified using a bone density scan. Women ages 67 years and over and women at risk for fractures or osteoporosis should discuss screening with their health care providers. Ask your health care provider whether you should take a calcium supplement or vitamin D to reduce the rate of osteoporosis.  Menopause can be associated with physical symptoms and risks. Hormone replacement therapy is available to decrease symptoms and risks. You should talk to your health care provider about whether hormone replacement therapy is right for you.  Use sunscreen. Apply sunscreen liberally and repeatedly throughout the day. You should seek shade when your shadow is shorter than you. Protect yourself by wearing long sleeves, pants, a wide-brimmed hat, and sunglasses year round, whenever you are outdoors.  Once a month, do a whole body skin exam, using a mirror to look at the skin on your back. Tell your health care provider of new moles, moles that have irregular borders, moles that  are larger than a pencil eraser, or moles that have changed in shape or color.  Stay current with required vaccines (immunizations).  Influenza vaccine. All adults should be immunized every year.  Tetanus, diphtheria, and acellular pertussis (Td, Tdap) vaccine. Pregnant women should receive 1 dose of Tdap vaccine during each pregnancy. The dose should be obtained regardless of the length of time since the last dose. Immunization is preferred during the 27th-36th week of gestation. An adult who has not previously received Tdap or who does not know her vaccine status should receive 1 dose of Tdap. This initial dose should be followed by tetanus and diphtheria toxoids (Td) booster doses every 10 years. Adults with an unknown or incomplete history of completing a 3-dose immunization series with Td-containing vaccines should begin or complete a primary immunization series including a Tdap dose. Adults should receive a Td booster every 10 years.  Varicella vaccine. An adult without evidence of immunity to varicella should receive 2 doses or a second dose if she has previously received 1 dose. Pregnant females who do not have evidence of immunity should receive the first dose after pregnancy. This first dose should be obtained before leaving the health care facility. The second dose should be obtained 4-8 weeks after the first dose.  Human papillomavirus (HPV) vaccine. Females aged 13-26 years who have not received the vaccine previously should obtain the 3-dose series. The vaccine is not recommended for use in pregnant females. However, pregnancy testing is not needed before receiving a dose. If a female is found to be pregnant after receiving a dose, no treatment is needed. In that case, the remaining doses should be delayed until after the pregnancy. Immunization is recommended for any person with an immunocompromised condition through the age of 61 years if she did not get any or all doses earlier. During the  3-dose series, the second dose should be obtained 4-8 weeks after the first dose. The third dose should be obtained 24 weeks after the first dose and 16 weeks after the second dose.  Zoster vaccine. One dose is recommended for adults aged 30 years or older unless certain conditions are present.  Measles, mumps, and rubella (MMR) vaccine. Adults born  before 1957 generally are considered immune to measles and mumps. Adults born in 1957 or later should have 1 or more doses of MMR vaccine unless there is a contraindication to the vaccine or there is laboratory evidence of immunity to each of the three diseases. A routine second dose of MMR vaccine should be obtained at least 28 days after the first dose for students attending postsecondary schools, health care workers, or international travelers. People who received inactivated measles vaccine or an unknown type of measles vaccine during 1963-1967 should receive 2 doses of MMR vaccine. People who received inactivated mumps vaccine or an unknown type of mumps vaccine before 1979 and are at high risk for mumps infection should consider immunization with 2 doses of MMR vaccine. For females of childbearing age, rubella immunity should be determined. If there is no evidence of immunity, females who are not pregnant should be vaccinated. If there is no evidence of immunity, females who are pregnant should delay immunization until after pregnancy. Unvaccinated health care workers born before 1957 who lack laboratory evidence of measles, mumps, or rubella immunity or laboratory confirmation of disease should consider measles and mumps immunization with 2 doses of MMR vaccine or rubella immunization with 1 dose of MMR vaccine.  Pneumococcal 13-valent conjugate (PCV13) vaccine. When indicated, a person who is uncertain of his immunization history and has no record of immunization should receive the PCV13 vaccine. All adults 65 years of age and older should receive this  vaccine. An adult aged 19 years or older who has certain medical conditions and has not been previously immunized should receive 1 dose of PCV13 vaccine. This PCV13 should be followed with a dose of pneumococcal polysaccharide (PPSV23) vaccine. Adults who are at high risk for pneumococcal disease should obtain the PPSV23 vaccine at least 8 weeks after the dose of PCV13 vaccine. Adults older than 67 years of age who have normal immune system function should obtain the PPSV23 vaccine dose at least 1 year after the dose of PCV13 vaccine.  Pneumococcal polysaccharide (PPSV23) vaccine. When PCV13 is also indicated, PCV13 should be obtained first. All adults aged 65 years and older should be immunized. An adult younger than age 65 years who has certain medical conditions should be immunized. Any person who resides in a nursing home or long-term care facility should be immunized. An adult smoker should be immunized. People with an immunocompromised condition and certain other conditions should receive both PCV13 and PPSV23 vaccines. People with human immunodeficiency virus (HIV) infection should be immunized as soon as possible after diagnosis. Immunization during chemotherapy or radiation therapy should be avoided. Routine use of PPSV23 vaccine is not recommended for American Indians, [place] Natives, or people younger than 65 years unless there are medical conditions that require PPSV23 vaccine. When indicated, people who have unknown immunization and have no record of immunization should receive PPSV23 vaccine. One-time revaccination 5 years after the first dose of PPSV23 is recommended for people aged 19-64 years who have chronic kidney failure, nephrotic syndrome, asplenia, or immunocompromised conditions. People who received 1-2 doses of PPSV23 before age 65 years should receive another dose of PPSV23 vaccine at age 65 years or later if at least 5 years have passed since the previous dose. Doses of PPSV23 are not  needed for people immunized with PPSV23 at or after age 65 years.  Meningococcal vaccine. Adults with asplenia or persistent complement component deficiencies should receive 2 doses of quadrivalent meningococcal conjugate (MenACWY-D) vaccine. The doses should be obtained   at least 2 months apart. Microbiologists working with certain meningococcal bacteria, [place] recruits, people at risk during an outbreak, and people who travel to or live in countries with a high rate of meningitis should be immunized. A first-year college student up through age 34 years who is living in a residence hall should receive a dose if she did not receive a dose on or after her 16th birthday. Adults who have certain high-risk conditions should receive one or more doses of vaccine.  Hepatitis A vaccine. Adults who wish to be protected from this disease, have certain high-risk conditions, work with hepatitis A-infected animals, work in hepatitis A research labs, or travel to or work in countries with a high rate of hepatitis A should be immunized. Adults who were previously unvaccinated and who anticipate close contact with an international adoptee during the first 60 days after arrival in the [place] from a country with a high rate of hepatitis A should be immunized.  Hepatitis B vaccine. Adults who wish to be protected from this disease, have certain high-risk conditions, may be exposed to blood or other infectious body fluids, are household contacts or sex partners of hepatitis B positive people, are clients or workers in certain care facilities, or travel to or work in countries with a high rate of hepatitis B should be immunized.  Haemophilus influenzae type b (Hib) vaccine. A previously unvaccinated person with asplenia or sickle cell disease or having a scheduled splenectomy should receive 1 dose of Hib vaccine. Regardless of previous immunization, a recipient of a hematopoietic stem cell transplant should receive a  3-dose series 6-12 months after her successful transplant. Hib vaccine is not recommended for adults with HIV infection. Preventive Services / Frequency Ages 35 to 4 years  Blood pressure check.** / Every 3-5 years.  Lipid and cholesterol check.** / Every 5 years beginning at age 60.  Clinical breast exam.** / Every 3 years for women in their 71s and 10s.  BRCA-related cancer risk assessment.** / For women who have family members with a BRCA-related cancer (breast, ovarian, tubal, or peritoneal cancers).  Pap test.** / Every 2 years from ages 76 through 26. Every 3 years starting at age 61 through age 76 or 93 with a history of 3 consecutive normal Pap tests.  HPV screening.** / Every 3 years from ages 37 through ages 60 to 51 with a history of 3 consecutive normal Pap tests.  Hepatitis C blood test.** / For any individual with known risks for hepatitis C.  Skin self-exam. / Monthly.  Influenza vaccine. / Every year.  Tetanus, diphtheria, and acellular pertussis (Tdap, Td) vaccine.** / Consult your health care provider. Pregnant women should receive 1 dose of Tdap vaccine during each pregnancy. 1 dose of Td every 10 years.  Varicella vaccine.** / Consult your health care provider. Pregnant females who do not have evidence of immunity should receive the first dose after pregnancy.  HPV vaccine. / 3 doses over 6 months, if 93 and younger. The vaccine is not recommended for use in pregnant females. However, pregnancy testing is not needed before receiving a dose.  Measles, mumps, rubella (MMR) vaccine.** / You need at least 1 dose of MMR if you were born in 1957 or later. You may also need a 2nd dose. For females of childbearing age, rubella immunity should be determined. If there is no evidence of immunity, females who are not pregnant should be vaccinated. If there is no evidence of immunity, females who are  pregnant should delay immunization until after pregnancy.  Pneumococcal  13-valent conjugate (PCV13) vaccine.** / Consult your health care provider.  Pneumococcal polysaccharide (PPSV23) vaccine.** / 1 to 2 doses if you smoke cigarettes or if you have certain conditions.  Meningococcal vaccine.** / 1 dose if you are age 68 to 8 years and a Market researcher living in a residence hall, or have one of several medical conditions, you need to get vaccinated against meningococcal disease. You may also need additional booster doses.  Hepatitis A vaccine.** / Consult your health care provider.  Hepatitis B vaccine.** / Consult your health care provider.  Haemophilus influenzae type b (Hib) vaccine.** / Consult your health care provider. Ages 7 to 53 years  Blood pressure check.** / Every year.  Lipid and cholesterol check.** / Every 5 years beginning at age 25 years.  Lung cancer screening. / Every year if you are aged 11-80 years and have a 30-pack-year history of smoking and currently smoke or have quit within the past 15 years. Yearly screening is stopped once you have quit smoking for at least 15 years or develop a health problem that would prevent you from having lung cancer treatment.  Clinical breast exam.** / Every year after age 48 years.  BRCA-related cancer risk assessment.** / For women who have family members with a BRCA-related cancer (breast, ovarian, tubal, or peritoneal cancers).  Mammogram.** / Every year beginning at age 41 years and continuing for as long as you are in good health. Consult with your health care provider.  Pap test.** / Every 3 years starting at age 65 years through age 37 or 70 years with a history of 3 consecutive normal Pap tests.  HPV screening.** / Every 3 years from ages 72 years through ages 60 to 40 years with a history of 3 consecutive normal Pap tests.  Fecal occult blood test (FOBT) of stool. / Every year beginning at age 21 years and continuing until age 5 years. You may not need to do this test if you get  a colonoscopy every 10 years.  Flexible sigmoidoscopy or colonoscopy.** / Every 5 years for a flexible sigmoidoscopy or every 10 years for a colonoscopy beginning at age 35 years and continuing until age 48 years.  Hepatitis C blood test.** / For all people born from [birth year] through 1965 and any individual with known risks for hepatitis C.  Skin self-exam. / Monthly.  Influenza vaccine. / Every year.  Tetanus, diphtheria, and acellular pertussis (Tdap/Td) vaccine.** / Consult your health care provider. Pregnant women should receive 1 dose of Tdap vaccine during each pregnancy. 1 dose of Td every 10 years.  Varicella vaccine.** / Consult your health care provider. Pregnant females who do not have evidence of immunity should receive the first dose after pregnancy.  Zoster vaccine.** / 1 dose for adults aged 30 years or older.  Measles, mumps, rubella (MMR) vaccine.** / You need at least 1 dose of MMR if you were born in 1957 or later. You may also need a second dose. For females of childbearing age, rubella immunity should be determined. If there is no evidence of immunity, females who are not pregnant should be vaccinated. If there is no evidence of immunity, females who are pregnant should delay immunization until after pregnancy.  Pneumococcal 13-valent conjugate (PCV13) vaccine.** / Consult your health care provider.  Pneumococcal polysaccharide (PPSV23) vaccine.** / 1 to 2 doses if you smoke cigarettes or if you have certain conditions.  Meningococcal vaccine.** /  Consult your health care provider.  Hepatitis A vaccine.** / Consult your health care provider.  Hepatitis B vaccine.** / Consult your health care provider.  Haemophilus influenzae type b (Hib) vaccine.** / Consult your health care provider. Ages 64 years and over  Blood pressure check.** / Every year.  Lipid and cholesterol check.** / Every 5 years beginning at age 23 years.  Lung cancer screening. / Every year if you  are aged 16-80 years and have a 30-pack-year history of smoking and currently smoke or have quit within the past 15 years. Yearly screening is stopped once you have quit smoking for at least 15 years or develop a health problem that would prevent you from having lung cancer treatment.  Clinical breast exam.** / Every year after age 74 years.  BRCA-related cancer risk assessment.** / For women who have family members with a BRCA-related cancer (breast, ovarian, tubal, or peritoneal cancers).  Mammogram.** / Every year beginning at age 44 years and continuing for as long as you are in good health. Consult with your health care provider.  Pap test.** / Every 3 years starting at age 58 years through age 22 or 39 years with 3 consecutive normal Pap tests. Testing can be stopped between 65 and 70 years with 3 consecutive normal Pap tests and no abnormal Pap or HPV tests in the past 10 years.  HPV screening.** / Every 3 years from ages 64 years through ages 70 or 61 years with a history of 3 consecutive normal Pap tests. Testing can be stopped between 65 and 70 years with 3 consecutive normal Pap tests and no abnormal Pap or HPV tests in the past 10 years.  Fecal occult blood test (FOBT) of stool. / Every year beginning at age 40 years and continuing until age 27 years. You may not need to do this test if you get a colonoscopy every 10 years.  Flexible sigmoidoscopy or colonoscopy.** / Every 5 years for a flexible sigmoidoscopy or every 10 years for a colonoscopy beginning at age 7 years and continuing until age 32 years.  Hepatitis C blood test.** / For all people born from [birth year] through 1965 and any individual with known risks for hepatitis C.  Osteoporosis screening.** / A one-time screening for women ages 30 years and over and women at risk for fractures or osteoporosis.  Skin self-exam. / Monthly.  Influenza vaccine. / Every year.  Tetanus, diphtheria, and acellular pertussis (Tdap/Td)  vaccine.** / 1 dose of Td every 10 years.  Varicella vaccine.** / Consult your health care provider.  Zoster vaccine.** / 1 dose for adults aged 35 years or older.  Pneumococcal 13-valent conjugate (PCV13) vaccine.** / Consult your health care provider.  Pneumococcal polysaccharide (PPSV23) vaccine.** / 1 dose for all adults aged 46 years and older.  Meningococcal vaccine.** / Consult your health care provider.  Hepatitis A vaccine.** / Consult your health care provider.  Hepatitis B vaccine.** / Consult your health care provider.  Haemophilus influenzae type b (Hib) vaccine.** / Consult your health care provider. ** Family history and personal history of risk and conditions may change your health care provider's recommendations.   This information is not intended to replace advice given to you by your health care provider. Make sure you discuss any questions you have with your health care provider.   Document Released: 10/06/2001 Document Revised: 08/31/2014 Document Reviewed: 01/05/2011 Elsevier Interactive Patient Education [employer].

## 2015-09-27 NOTE — Assessment & Plan Note (Signed)
Here for medicare wellness/physical  Diet: heart healthy  Physical activity: not sedentary  Depression/mood screen: negative  Hearing: intact to whispered voice  Visual acuity: grossly normal, performs annual eye exam  ADLs: capable  Fall risk: none  Home safety: good  Cognitive evaluation: intact to orientation, naming, recall and repetition  EOL planning: adv directives, full code/ I agree  I have personally reviewed and have noted  1. The patient's medical, surgical and social history  2. Their use of alcohol, tobacco or illicit drugs  3. Their current medications and supplements  4. The patient's functional ability including ADL's, fall risks, home safety risks and hearing or visual impairment.  5. Diet and physical activities  6. Evidence for depression or mood disorders 7. The roster of all physicians providing medical care to patient - is listed in the Snapshot section of the chart and reviewed today.    Today patient counseled on age appropriate routine health concerns for screening and prevention, each reviewed and up to date or declined. Immunizations reviewed and up to date or declined. Labs ordered and reviewed. Risk factors for depression reviewed and negative. Hearing function and visual acuity are intact. ADLs screened and addressed as needed. Functional ability and level of safety reviewed and appropriate. Education, counseling and referrals performed based on assessed risks today. Patient provided with a copy of personalized plan for preventive services.   Colon due 2018

## 2015-09-27 NOTE — Progress Notes (Signed)
Subjective:  Patient ID: Joann Fields, female    DOB: Oct 05, 1950  Age: 65 y.o. MRN: [id number]  CC: No chief complaint on file.   HPI KAELIN SICKLES presents for a well exam  Outpatient Prescriptions Prior to Visit  Medication Sig Dispense Refill  . ranitidine (ZANTAC) 150 MG tablet TAKE 1 TABLET (150 MG TOTAL) BY MOUTH 2 (TWO) TIMES DAILY. 60 tablet 2  . Cyanocobalamin (VITAMIN B-12) 1000 MCG SUBL Place 1 tablet (1,000 mcg total) under the tongue daily. (Patient taking differently: Place 1 tablet under the tongue daily. Pt takes OTC brand) 100 tablet 3  . diltiazem (DILACOR XR) 180 MG 24 hr capsule TAKE 1 CAPSULE (180 MG TOTAL) BY MOUTH DAILY. 90 capsule 1   No facility-administered medications prior to visit.    ROS Review of Systems  Constitutional: Negative for chills, activity change, appetite change, fatigue and unexpected weight change.  HENT: Negative for congestion, mouth sores and sinus pressure.   Eyes: Negative for visual disturbance.  Respiratory: Negative for cough and chest tightness.   Gastrointestinal: Negative for nausea and abdominal pain.  Genitourinary: Negative for frequency, difficulty urinating and vaginal pain.  Musculoskeletal: Negative for back pain and gait problem.  Skin: Negative for pallor and rash.  Neurological: Negative for dizziness, tremors, weakness, numbness and headaches.  Psychiatric/Behavioral: Negative for confusion and sleep disturbance.    Objective:  BP 130/80 mmHg  Pulse 74  Ht 5\' 5"  (1.651 m)  Wt 155 lb (70.308 kg)  BMI 25.79 kg/m2  SpO2 97%  BP Readings from Last 3 Encounters:  09/27/15 130/80  01/11/15 130/82  11/14/14 128/80    Wt Readings from Last 3 Encounters:  09/27/15 155 lb (70.308 kg)  01/11/15 159 lb (72.122 kg)  11/14/14 159 lb 4 oz (72.235 kg)    Physical Exam  Constitutional: She appears well-developed. No distress.  HENT:  Head: Normocephalic.  Right Ear: External ear normal.  Left Ear:  External ear normal.  Nose: Nose normal.  Mouth/Throat: Oropharynx is clear and moist.  Eyes: Conjunctivae are normal. Pupils are equal, round, and reactive to light. Right eye exhibits no discharge. Left eye exhibits no discharge.  Neck: Normal range of motion. Neck supple. No JVD present. No tracheal deviation present. No thyromegaly present.  Cardiovascular: Normal rate, regular rhythm and normal heart sounds.   Pulmonary/Chest: No stridor. No respiratory distress. She has no wheezes.  Abdominal: Soft. Bowel sounds are normal. She exhibits no distension and no mass. There is no tenderness. There is no rebound and no guarding.  Musculoskeletal: She exhibits no edema or tenderness.  Lymphadenopathy:    She has no cervical adenopathy.  Neurological: She displays normal reflexes. No cranial nerve deficit. She exhibits normal muscle tone. Coordination normal.  Skin: No rash noted. No erythema.  Psychiatric: She has a normal mood and affect. Her behavior is normal. Judgment and thought content normal.    Lab Results  Component Value Date   WBC 7.3 10/05/2014   HGB 13.1 10/05/2014   HCT 38.6 10/05/2014   PLT 332.0 10/05/2014   GLUCOSE 99 10/05/2014   CHOL 241* 12/11/2014   TRIG 96 12/11/2014   HDL 57 12/11/2014   LDLDIRECT 165.3 09/21/2013   LDLCALC 165* 12/11/2014   ALT 39* 10/05/2014   AST 28 10/05/2014   NA 142 10/05/2014   K 4.2 10/05/2014   CL 107 10/05/2014   CREATININE 1.05 10/05/2014   BUN 15 10/05/2014   CO2 29 10/05/2014  TSH 1.17 10/05/2014    Dg Chest 2 View  11/14/2014  CLINICAL DATA:  One week history of epigastric pain radiating to the back, dysphagia, and indigestion symptoms. EXAM: CHEST  2 VIEW COMPARISON:  None. FINDINGS: The lungs are adequately inflated and clear. The heart and pulmonary vascularity are normal. The mediastinum is normal in width. There is no pleural effusion or pneumothorax. The bony thorax is unremarkable. IMPRESSION: There is no active  cardiopulmonary disease. Electronically Signed   By: David  Martinique   On: 11/14/2014 15:52    Assessment & Plan:   Diagnoses and all orders for this visit:  Well adult exam -     Basic metabolic panel; Future -     CBC with Differential/Platelet; Future -     Hepatic function panel; Future -     Hepatitis C antibody; Future -     Lipid panel; Future -     TSH; Future -     Urinalysis; Future  Need for influenza vaccination -     Flu Vaccine QUAD 36+ mos IM  Other orders -     Cyanocobalamin (VITAMIN B-12) 1000 MCG SUBL; Place 1 tablet (1,000 mcg total) under the tongue daily. -     cholecalciferol (VITAMIN D) 1000 units tablet; Take 1 tablet (1,000 Units total) by mouth daily. -     diltiazem (DILACOR XR) 180 MG 24 hr capsule; TAKE 1 CAPSULE (180 MG TOTAL) BY MOUTH DAILY.  I am having Ms. Delisi start on cholecalciferol. I am also having her maintain her ranitidine, Vitamin B-12, and diltiazem.  Meds ordered this encounter  Medications  . Cyanocobalamin (VITAMIN B-12) 1000 MCG SUBL    Sig: Place 1 tablet (1,000 mcg total) under the tongue daily.    Dispense:  100 tablet    Refill:  3  . cholecalciferol (VITAMIN D) 1000 units tablet    Sig: Take 1 tablet (1,000 Units total) by mouth daily.    Dispense:  100 tablet    Refill:  3  . diltiazem (DILACOR XR) 180 MG 24 hr capsule    Sig: TAKE 1 CAPSULE (180 MG TOTAL) BY MOUTH DAILY.    Dispense:  90 capsule    Refill:  3     Follow-up: Return in about 1 year (around 09/26/2016) for Wellness Exam.  Walker Kehr, MD

## 2015-10-11 ENCOUNTER — Other Ambulatory Visit (INDEPENDENT_AMBULATORY_CARE_PROVIDER_SITE_OTHER): Payer: Medicare Other

## 2015-10-11 DIAGNOSIS — Z0000 Encounter for general adult medical examination without abnormal findings: Secondary | ICD-10-CM | POA: Diagnosis not present

## 2015-10-11 LAB — BASIC METABOLIC PANEL
BUN: 16 mg/dL (ref 6–23)
CHLORIDE: 109 meq/L (ref 96–112)
CO2: 26 mEq/L (ref 19–32)
Calcium: 9.9 mg/dL (ref 8.4–10.5)
Creatinine, Ser: 1 mg/dL (ref 0.40–1.20)
GFR: 71.18 mL/min (ref >60.00)
Glucose, Bld: 96 mg/dL (ref 70–99)
POTASSIUM: 4 meq/L (ref 3.5–5.1)
Sodium: 142 mEq/L (ref 135–145)

## 2015-10-11 LAB — TSH: TSH: 0.78 u[IU]/mL (ref 0.35–4.50)

## 2015-10-11 LAB — URINALYSIS, ROUTINE W REFLEX MICROSCOPIC
Bilirubin Urine: NEGATIVE
KETONES UR: NEGATIVE
Nitrite: NEGATIVE
PH: 5.5 (ref 5.0–8.0)
RBC / HPF: NONE SEEN (ref 0–?)
TOTAL PROTEIN, URINE-UPE24: NEGATIVE
URINE GLUCOSE: NEGATIVE
UROBILINOGEN UA: 0.2 (ref 0.0–1.0)

## 2015-10-11 LAB — CBC WITH DIFFERENTIAL/PLATELET
BASOS PCT: 0.8 % (ref 0.0–3.0)
Basophils Absolute: 0.1 10*3/uL (ref 0.0–0.1)
EOS ABS: 0.1 10*3/uL (ref 0.0–0.7)
EOS PCT: 1.5 % (ref 0.0–5.0)
HEMATOCRIT: 39.2 % (ref 36.0–46.0)
HEMOGLOBIN: 13.3 g/dL (ref 12.0–15.0)
LYMPHS PCT: 33.4 % (ref 12.0–46.0)
Lymphs Abs: 2.3 10*3/uL (ref 0.7–4.0)
MCHC: 33.8 g/dL (ref 30.0–36.0)
MCV: 88.6 fl (ref 78.0–100.0)
MONOS PCT: 6.6 % (ref 3.0–12.0)
Monocytes Absolute: 0.4 10*3/uL (ref 0.1–1.0)
Neutro Abs: 3.9 10*3/uL (ref 1.4–7.7)
Neutrophils Relative %: 57.7 % (ref 43.0–77.0)
Platelets: 290 10*3/uL (ref 150.0–400.0)
RBC: 4.43 Mil/uL (ref 3.87–5.11)
RDW: 13.9 % (ref 11.5–15.5)
WBC: 6.8 10*3/uL (ref 4.0–10.5)

## 2015-10-11 LAB — LIPID PANEL
CHOL/HDL RATIO: 4
CHOLESTEROL: 226 mg/dL — AB (ref 0–200)
HDL: 59.3 mg/dL (ref >39.00)
LDL Cholesterol: 150 mg/dL — ABNORMAL HIGH (ref 0–99)
NonHDL: 166.45
TRIGLYCERIDES: 80 mg/dL (ref 0.0–149.0)
VLDL: 16 mg/dL (ref 0.0–40.0)

## 2015-10-11 LAB — HEPATIC FUNCTION PANEL
ALT: 21 U/L (ref 0–35)
AST: 22 U/L (ref 0–37)
Albumin: 4.1 g/dL (ref 3.5–5.2)
Alkaline Phosphatase: 59 U/L (ref 39–117)
BILIRUBIN DIRECT: 0.1 mg/dL (ref 0.0–0.3)
TOTAL PROTEIN: 7.3 g/dL (ref 6.0–8.3)
Total Bilirubin: 0.4 mg/dL (ref 0.2–1.2)

## 2015-10-12 LAB — HEPATITIS C ANTIBODY: HCV Ab: NEGATIVE

## 2015-10-19 ENCOUNTER — Other Ambulatory Visit: Payer: Self-pay | Admitting: Internal Medicine

## 2015-11-22 ENCOUNTER — Encounter: Payer: Self-pay | Admitting: Internal Medicine

## 2015-11-22 ENCOUNTER — Ambulatory Visit (INDEPENDENT_AMBULATORY_CARE_PROVIDER_SITE_OTHER): Payer: Medicare Other | Admitting: Internal Medicine

## 2015-11-22 VITALS — BP 140/82 | HR 67 | Temp 98.2°F | Wt 156.0 lb

## 2015-11-22 DIAGNOSIS — J069 Acute upper respiratory infection, unspecified: Secondary | ICD-10-CM | POA: Diagnosis not present

## 2015-11-22 MED ORDER — PROMETHAZINE-CODEINE 6.25-10 MG/5ML PO SYRP: 5.0000 mL | ORAL_SOLUTION | ORAL | Status: DC | PRN

## 2015-11-22 MED ORDER — LEVOFLOXACIN 500 MG PO TABS: 500.0000 mg | ORAL_TABLET | Freq: Every day | ORAL | Status: DC

## 2015-11-22 NOTE — Patient Instructions (Addendum)
Use over-the-counter  "cold" medicines  such as"Afrin" nasal spray for nasal congestion as directed instead. Use " Delsym" or" Robitussin" cough syrup varietis for cough.  You can use plain "Tylenol" or "Advil" for fever, chills and achyness. Use Halls or Ricola cough drops.  Please, make an appointment if you are not better or if you're worse.  

## 2015-11-22 NOTE — Progress Notes (Signed)
Pre visit review using our clinic review tool, if applicable. No additional management support is needed unless otherwise documented below in the visit note. 

## 2015-11-22 NOTE — Assessment & Plan Note (Signed)
Sinusitis/bronchitis Levaquin Rx x 10 d Prom-cod syr

## 2015-11-22 NOTE — Progress Notes (Signed)
Subjective:  Patient ID: Joann Fields, female    DOB: 1951-06-25  Age: 65 y.o. MRN: [id number]  CC: No chief complaint on file.   HPI Joann Fields presents for URI sx's x 2 weeks  Outpatient Prescriptions Prior to Visit  Medication Sig Dispense Refill  . cholecalciferol (VITAMIN D) 1000 units tablet Take 1 tablet (1,000 Units total) by mouth daily. 100 tablet 3  . Cyanocobalamin (VITAMIN B-12) 1000 MCG SUBL Place 1 tablet (1,000 mcg total) under the tongue daily. 100 tablet 3  . diltiazem (DILACOR XR) 180 MG 24 hr capsule TAKE 1 CAPSULE (180 MG TOTAL) BY MOUTH DAILY. 90 capsule 3  . ranitidine (ZANTAC) 150 MG tablet TAKE 1 TABLET (150 MG TOTAL) BY MOUTH 2 (TWO) TIMES DAILY. 60 tablet 2  . ranitidine (ZANTAC) 150 MG tablet TAKE 1 TABLET (150 MG TOTAL) BY MOUTH 2 (TWO) TIMES DAILY. (Patient not taking: Reported on 11/22/2015) 60 tablet 2   No facility-administered medications prior to visit.    ROS Review of Systems  Constitutional: Positive for fatigue.  HENT: Positive for congestion, rhinorrhea, sinus pressure and sore throat.   Respiratory: Positive for cough. Negative for shortness of breath.     Objective:  BP 140/82 mmHg  Pulse 67  Temp(Src) 98.2 F (36.8 C) (Oral)  Wt 156 lb (70.761 kg)  SpO2 97%  BP Readings from Last 3 Encounters:  11/22/15 140/82  09/27/15 130/80  01/11/15 130/82    Wt Readings from Last 3 Encounters:  11/22/15 156 lb (70.761 kg)  09/27/15 155 lb (70.308 kg)  01/11/15 159 lb (72.122 kg)    Physical Exam  Constitutional: She appears well-developed. No distress.  HENT:  Head: Normocephalic.  Right Ear: External ear normal.  Left Ear: External ear normal.  Nose: Nose normal.  Mouth/Throat: Oropharynx is clear and moist.  Eyes: Conjunctivae are normal. Pupils are equal, round, and reactive to light. Right eye exhibits no discharge. Left eye exhibits no discharge.  Neck: Normal range of motion. Neck supple. No JVD present. No  tracheal deviation present. No thyromegaly present.  Cardiovascular: Normal rate, regular rhythm and normal heart sounds.   Pulmonary/Chest: No stridor. No respiratory distress. She has no wheezes.  Abdominal: Soft. Bowel sounds are normal. She exhibits no distension and no mass. There is no tenderness. There is no rebound and no guarding.  Musculoskeletal: She exhibits no edema or tenderness.  Lymphadenopathy:    She has no cervical adenopathy.  Neurological: She displays normal reflexes. No cranial nerve deficit. She exhibits normal muscle tone. Coordination normal.  Skin: No rash noted. No erythema.  Psychiatric: She has a normal mood and affect. Her behavior is normal. Judgment and thought content normal.  eryth throat  Lab Results  Component Value Date   WBC 6.8 10/11/2015   HGB 13.3 10/11/2015   HCT 39.2 10/11/2015   PLT 290.0 10/11/2015   GLUCOSE 96 10/11/2015   CHOL 226* 10/11/2015   TRIG 80.0 10/11/2015   HDL 59.30 10/11/2015   LDLDIRECT 165.3 09/21/2013   LDLCALC 150* 10/11/2015   ALT 21 10/11/2015   AST 22 10/11/2015   NA 142 10/11/2015   K 4.0 10/11/2015   CL 109 10/11/2015   CREATININE 1.00 10/11/2015   BUN 16 10/11/2015   CO2 26 10/11/2015   TSH 0.78 10/11/2015    Dg Chest 2 View  11/14/2014  CLINICAL DATA:  One week history of epigastric pain radiating to the back, dysphagia, and indigestion symptoms. EXAM: CHEST  2 VIEW COMPARISON:  None. FINDINGS: The lungs are adequately inflated and clear. The heart and pulmonary vascularity are normal. The mediastinum is normal in width. There is no pleural effusion or pneumothorax. The bony thorax is unremarkable. IMPRESSION: There is no active cardiopulmonary disease. Electronically Signed   By: David  Martinique   On: 11/14/2014 15:52    Assessment & Plan:   There are no diagnoses linked to this encounter. I am having Joann Fields maintain her ranitidine, Vitamin B-12, cholecalciferol, and diltiazem.  No orders of the  defined types were placed in this encounter.     Follow-up: No Follow-up on file.  Walker Kehr, MD

## 2015-12-13 ENCOUNTER — Other Ambulatory Visit: Payer: Self-pay | Admitting: Internal Medicine

## 2016-07-27 ENCOUNTER — Other Ambulatory Visit: Payer: Self-pay | Admitting: Obstetrics and Gynecology

## 2016-07-27 DIAGNOSIS — M810 Age-related osteoporosis without current pathological fracture: Secondary | ICD-10-CM

## 2016-08-11 ENCOUNTER — Ambulatory Visit
Admission: RE | Admit: 2016-08-11 | Discharge: 2016-08-11 | Disposition: A | Discharge status: Home / Self Care | Payer: Medicare Other | Source: Ambulatory Visit | Attending: Obstetrics and Gynecology | Admitting: Obstetrics and Gynecology

## 2016-08-11 DIAGNOSIS — M810 Age-related osteoporosis without current pathological fracture: Secondary | ICD-10-CM

## 2016-08-24 HISTORY — PX: COLONOSCOPY: SHX174

## 2016-09-07 ENCOUNTER — Encounter: Payer: Self-pay | Admitting: Gastroenterology

## 2016-09-27 ENCOUNTER — Other Ambulatory Visit: Payer: Self-pay | Admitting: Internal Medicine

## 2016-09-28 ENCOUNTER — Encounter: Payer: Self-pay | Admitting: Gastroenterology

## 2016-10-14 ENCOUNTER — Encounter: Payer: Self-pay | Admitting: Internal Medicine

## 2016-10-14 ENCOUNTER — Ambulatory Visit (INDEPENDENT_AMBULATORY_CARE_PROVIDER_SITE_OTHER): Payer: Medicare Other | Admitting: Internal Medicine

## 2016-10-14 VITALS — BP 120/82 | HR 72 | Temp 98.2°F | Resp 16 | Ht 65.0 in | Wt 159.0 lb

## 2016-10-14 DIAGNOSIS — Z23 Encounter for immunization: Secondary | ICD-10-CM | POA: Diagnosis not present

## 2016-10-14 DIAGNOSIS — M545 Low back pain, unspecified: Secondary | ICD-10-CM | POA: Insufficient documentation

## 2016-10-14 DIAGNOSIS — E785 Hyperlipidemia, unspecified: Secondary | ICD-10-CM

## 2016-10-14 DIAGNOSIS — Z0001 Encounter for general adult medical examination with abnormal findings: Secondary | ICD-10-CM

## 2016-10-14 DIAGNOSIS — G8929 Other chronic pain: Secondary | ICD-10-CM

## 2016-10-14 DIAGNOSIS — E538 Deficiency of other specified B group vitamins: Secondary | ICD-10-CM

## 2016-10-14 DIAGNOSIS — I10 Essential (primary) hypertension: Secondary | ICD-10-CM

## 2016-10-14 DIAGNOSIS — Z0000 Encounter for general adult medical examination without abnormal findings: Secondary | ICD-10-CM

## 2016-10-14 DIAGNOSIS — B351 Tinea unguium: Secondary | ICD-10-CM

## 2016-10-14 MED ORDER — PNEUMOCOCCAL 13-VAL CONJ VACC IM SUSP: 0.5000 mL | INTRAMUSCULAR | Status: DC

## 2016-10-14 NOTE — Progress Notes (Signed)
Subjective:  Patient ID: [name], female    DOB: Mar 24, 1951  Age: 66 y.o. MRN: [id number]  CC: Annual Exam (possinle fungus on toes)   HPI Joann Fields presents for a well exam C/o toenail fungus C/o LBP x years  Outpatient Medications Prior to Visit  Medication Sig Dispense Refill  . Cyanocobalamin (VITAMIN B-12) 1000 MCG SUBL Place 1 tablet (1,000 mcg total) under the tongue daily. 100 tablet 3  . diltiazem (CARDIZEM CD) 180 MG 24 hr capsule Take 1 capsule (180 mg total) by mouth daily. Overdue for yearly physical w/labs must see MD for refills 30 capsule 0  . promethazine-codeine (PHENERGAN WITH CODEINE) 6.25-10 MG/5ML syrup Take 5 mLs by mouth every 4 (four) hours as needed. 300 mL 0  . ranitidine (ZANTAC) 150 MG tablet TAKE 1 TABLET (150 MG TOTAL) BY MOUTH 2 (TWO) TIMES DAILY. 60 tablet 2  . levofloxacin (LEVAQUIN) 500 MG tablet Take 1 tablet (500 mg total) by mouth daily. 10 tablet 0   No facility-administered medications prior to visit.     ROS Review of Systems  Constitutional: Negative for activity change, appetite change, chills, fatigue and unexpected weight change.  HENT: Negative for congestion, mouth sores and sinus pressure.   Eyes: Negative for visual disturbance.  Respiratory: Negative for cough and chest tightness.   Gastrointestinal: Negative for abdominal pain and nausea.  Genitourinary: Negative for difficulty urinating, frequency and vaginal pain.  Musculoskeletal: Positive for arthralgias and back pain. Negative for gait problem.  Skin: Negative for pallor and rash.  Neurological: Negative for dizziness, tremors, weakness, numbness and headaches.  Psychiatric/Behavioral: Negative for confusion and sleep disturbance.    Objective:  BP 120/82   Pulse 72   Temp 98.2 F (36.8 C) (Oral)   Resp 16   Ht 5\' 5"  (1.651 m)   Wt 159 lb (72.1 kg)   SpO2 97%   BMI 26.46 kg/m   BP Readings from Last 3 Encounters:  10/14/16 120/82  11/22/15  140/82  09/27/15 130/80    Wt Readings from Last 3 Encounters:  10/14/16 159 lb (72.1 kg)  11/22/15 156 lb (70.8 kg)  09/27/15 155 lb (70.3 kg)    Physical Exam  Constitutional: She appears well-developed. No distress.  HENT:  Head: Normocephalic.  Right Ear: External ear normal.  Left Ear: External ear normal.  Nose: Nose normal.  Mouth/Throat: Oropharynx is clear and moist.  Eyes: Conjunctivae are normal. Pupils are equal, round, and reactive to light. Right eye exhibits no discharge. Left eye exhibits no discharge.  Neck: Normal range of motion. Neck supple. No JVD present. No tracheal deviation present. No thyromegaly present.  Cardiovascular: Normal rate, regular rhythm and normal heart sounds.   Pulmonary/Chest: No stridor. No respiratory distress. She has no wheezes.  Abdominal: Soft. Bowel sounds are normal. She exhibits no distension and no mass. There is no tenderness. There is no rebound and no guarding.  Musculoskeletal: She exhibits no edema or tenderness.  Lymphadenopathy:    She has no cervical adenopathy.  Neurological: She displays normal reflexes. No cranial nerve deficit. She exhibits normal muscle tone. Coordination normal.  Skin: No rash noted. No erythema.  Psychiatric: She has a normal mood and affect. Her behavior is normal. Judgment and thought content normal.   Onycho - some nails on B feet LS tender a little   Lab Results  Component Value Date   WBC 6.8 10/11/2015   HGB 13.3 10/11/2015   HCT 39.2 10/11/2015  PLT 290.0 10/11/2015   GLUCOSE 96 10/11/2015   CHOL 226 (H) 10/11/2015   TRIG 80.0 10/11/2015   HDL 59.30 10/11/2015   LDLDIRECT 165.3 09/21/2013   LDLCALC 150 (H) 10/11/2015   ALT 21 10/11/2015   AST 22 10/11/2015   NA 142 10/11/2015   K 4.0 10/11/2015   CL 109 10/11/2015   CREATININE 1.00 10/11/2015   BUN 16 10/11/2015   CO2 26 10/11/2015   TSH 0.78 10/11/2015    Dg Bone Density (dxa)  Result Date: 08/11/2016 EXAM: DUAL  X-RAY ABSORPTIOMETRY (DXA) FOR BONE MINERAL DENSITY IMPRESSION: Referring Physician:  Brien Few PATIENT: Name: Joann Fields, Joann Fields Patient ID: [id number] Birth Date: 03-Sep-1950 Height: 66.0 in. Sex: Female Measured: 08/11/2016 Weight: 159.0 lbs. Indications: Estrogen Deficient, Low Calcium Intake (269.3), Postmenopausal Fractures: None Treatments: Vitamin D (E933.5) ASSESSMENT: The BMD measured at Femur Neck Right is 0.887 g/cm2 with a T-score of -1.1. This patient is considered osteopenic according to [place] [facility]) criteria. There has been a statistically significant increase in BMD of Lumbar spine since prior exam dated 08/14/2014. Site Region Measured Date Measured Age YA BMD Significant CHANGE T-score DualFemur Neck Right 08[age]11/2016    [age]         -1.1    0.887 g/cm2 AP Spine L1-L4 08/11/2016 [age] -1.0 1.070 g/cm2 * World Health Organization [facility]) criteria for post-menopausal, Caucasian Women: Normal       T-score at or above -1 SD Osteopenia   T-score between -1 and -2.5 SD Osteoporosis T-score at or below -2.5 SD RECOMMENDATION: [place] recommends that FDA-approved medical therapies be considered in postmenopausal women and men age 36 or older with a: 1. Hip or vertebral (clinical or morphometric) fracture. 2. T-score of <-2.5 at the spine or hip. 3. Ten-year fracture probability by FRAX of 3% or greater for hip fracture or 20% or greater for major osteoporotic fracture. All treatment decisions require clinical judgment and consideration of individual patient factors, including patient preferences, co-morbidities, previous drug use, risk factors not captured in the FRAX model (e.g. falls, vitamin D deficiency, increased bone turnover, interval significant decline in bone density) and possible under - or over-estimation of fracture risk by FRAX. All patients should ensure an adequate intake of dietary calcium (1200 mg/d) and vitamin D (800 IU daily) unless  contraindicated. FOLLOW-UP: People with diagnosed cases of osteoporosis or at high risk for fracture should have regular bone mineral density tests. For patients eligible for Medicare, routine testing is allowed once every 2 years. The testing frequency can be increased to one year for patients who have rapidly progressing disease, those who are receiving or discontinuing medical therapy to restore bone mass, or have additional risk factors. I have reviewed this report, and agree with the above findings. [facility] FRAX* 10-year Probability of Fracture Based on femoral neck BMD: DualFemur (Right) Major Osteoporotic Fracture: 3.9% Hip Fracture:                0.4% Population:                  [place] ([place]) Risk Factors:                None *FRAX is a Materials engineer of the [employer] of [employer] for Metabolic Bone Disease, a World Pharmacologist (WHO) [facility]. ASSESSMENT: The probability of a major osteoporotic fracture is 3.9 % within the next ten years. The probability of a hip fracture is 0.4 % within the  next ten years. Electronically Signed   By: Lowella Grip III M.D.   On: 08/11/2016 09:40    Assessment & Plan:   Joann Fields was seen today for annual exam.  Diagnoses and all orders for this visit:  Encounter for immunization -     Flu vaccine HIGH DOSE PF   I have discontinued Joann Fields's levofloxacin. I am also having her maintain her ranitidine, Vitamin B-12, promethazine-codeine, diltiazem, and CALCIUM-VITAMIN D PO.  Meds ordered this encounter  Medications  . CALCIUM-VITAMIN D PO    Sig: Take 1 capsule by mouth daily. Takes 600 mg     Follow-up: No Follow-up on file.  Walker Kehr, MD

## 2016-10-14 NOTE — Assessment & Plan Note (Signed)
On diet Labs 

## 2016-10-14 NOTE — Assessment & Plan Note (Signed)
Here for medicare wellness/physical  Diet: heart healthy  Physical activity: not sedentary  Depression/mood screen: negative  Hearing: intact to whispered voice  Visual acuity: grossly normal, performs annual eye exam  ADLs: capable  Fall risk: low to none  Home safety: good  Cognitive evaluation: intact to orientation, naming, recall and repetition  EOL planning: adv directives, full code/ I agree  I have personally reviewed and have noted  1. The patient's medical, surgical and social history  2. Their use of alcohol, tobacco or illicit drugs  3. Their current medications and supplements  4. The patient's functional ability including ADL's, fall risks, home safety risks and hearing or visual impairment.  5. Diet and physical activities  6. Evidence for depression or mood disorders 7. The roster of all physicians providing medical care to patient - is listed in the Snapshot section of the chart and reviewed today.    Today patient counseled on age appropriate routine health concerns for screening and prevention, each reviewed and up to date or declined. Immunizations reviewed and up to date or declined. Labs ordered and reviewed. Risk factors for depression reviewed and negative. Hearing function and visual acuity are intact. ADLs screened and addressed as needed. Functional ability and level of safety reviewed and appropriate. Education, counseling and referrals performed based on assessed risks today. Patient provided with a copy of personalized plan for preventive services.     Colon is scheduled

## 2016-10-14 NOTE — Progress Notes (Signed)
Pre-visit discussion using our clinic review tool. No additional management support is needed unless otherwise documented below in the visit note.  

## 2016-10-14 NOTE — Assessment & Plan Note (Signed)
R>L [facility] Try TRE - Trauma Release Exercise  You can get a "Stress Less TRE" application

## 2016-10-14 NOTE — Assessment & Plan Note (Signed)
Podiatry ref 

## 2016-10-14 NOTE — Patient Instructions (Addendum)
Try TRE - Trauma Release Exercise  You can get a "Stress Less TRE" application   Health Maintenance for Postmenopausal Women Introduction Menopause is a normal process in which your reproductive ability comes to an end. This process happens gradually over a span of months to years, usually between the ages of 93 and 29. Menopause is complete when you have missed 12 consecutive menstrual periods. It is important to talk with your health care provider about some of the most common conditions that affect postmenopausal women, such as heart disease, cancer, and bone loss (osteoporosis). Adopting a healthy lifestyle and getting preventive care can help to promote your health and wellness. Those actions can also lower your chances of developing some of these common conditions. What should I know about menopause? During menopause, you may experience a number of symptoms, such as:  Moderate-to-severe hot flashes.  Night sweats.  Decrease in sex drive.  Mood swings.  Headaches.  Tiredness.  Irritability.  Memory problems.  Insomnia. Choosing to treat or not to treat menopausal changes is an individual decision that you make with your health care provider. What should I know about hormone replacement therapy and supplements? Hormone therapy products are effective for treating symptoms that are associated with menopause, such as hot flashes and night sweats. Hormone replacement carries certain risks, especially as you become older. If you are thinking about using estrogen or estrogen with progestin treatments, discuss the benefits and risks with your health care provider. What should I know about heart disease and stroke? Heart disease, heart attack, and stroke become more likely as you age. This may be due, in part, to the hormonal changes that your body experiences during menopause. These can affect how your body processes dietary fats, triglycerides, and cholesterol. Heart attack and stroke  are both medical emergencies. There are many things that you can do to help prevent heart disease and stroke:  Have your blood pressure checked at least every 1-2 years. High blood pressure causes heart disease and increases the risk of stroke.  If you are 19-79 years old, ask your health care provider if you should take aspirin to prevent a heart attack or a stroke.  Do not use any tobacco products, including cigarettes, chewing tobacco, or electronic cigarettes. If you need help quitting, ask your health care provider.  It is important to eat a healthy diet and maintain a healthy weight.  Be sure to include plenty of vegetables, fruits, low-fat dairy products, and lean protein.  Avoid eating foods that are high in solid fats, added sugars, or salt (sodium).  Get regular exercise. This is one of the most important things that you can do for your health.  Try to exercise for at least 150 minutes each week. The type of exercise that you do should increase your heart rate and make you sweat. This is known as moderate-intensity exercise.  Try to do strengthening exercises at least twice each week. Do these in addition to the moderate-intensity exercise.  Know your numbers.Ask your health care provider to check your cholesterol and your blood glucose. Continue to have your blood tested as directed by your health care provider. What should I know about cancer screening? There are several types of cancer. Take the following steps to reduce your risk and to catch any cancer development as early as possible. Breast Cancer  Practice breast self-awareness.  This means understanding how your breasts normally appear and feel.  It also means doing regular breast self-exams. Let your  health care provider know about any changes, no matter how small.  If you are 64 or older, have a clinician do a breast exam (clinical breast exam or CBE) every year. Depending on your age, family history, and medical  history, it may be recommended that you also have a yearly breast X-ray (mammogram).  If you have a family history of breast cancer, talk with your health care provider about genetic screening.  If you are at high risk for breast cancer, talk with your health care provider about having an MRI and a mammogram every year.  Breast cancer (BRCA) gene test is recommended for women who have family members with BRCA-related cancers. Results of the assessment will determine the need for genetic counseling and BRCA1 and for BRCA2 testing. BRCA-related cancers include these types:  Breast. This occurs in males or females.  Ovarian.  Tubal. This may also be called fallopian tube cancer.  Cancer of the abdominal or pelvic lining (peritoneal cancer).  Prostate.  Pancreatic. Cervical, Uterine, and Ovarian Cancer  Your health care provider may recommend that you be screened regularly for cancer of the pelvic organs. These include your ovaries, uterus, and vagina. This screening involves a pelvic exam, which includes checking for microscopic changes to the surface of your cervix (Pap test).  For women ages 21-65, health care providers may recommend a pelvic exam and a Pap test every three years. For women ages 91-65, they may recommend the Pap test and pelvic exam, combined with testing for human papilloma virus (HPV), every five years. Some types of HPV increase your risk of cervical cancer. Testing for HPV may also be done on women of any age who have unclear Pap test results.  Other health care providers may not recommend any screening for nonpregnant women who are considered low risk for pelvic cancer and have no symptoms. Ask your health care provider if a screening pelvic exam is right for you.  If you have had past treatment for cervical cancer or a condition that could lead to cancer, you need Pap tests and screening for cancer for at least 20 years after your treatment. If Pap tests have been  discontinued for you, your risk factors (such as having a new sexual partner) need to be reassessed to determine if you should start having screenings again. Some women have medical problems that increase the chance of getting cervical cancer. In these cases, your health care provider may recommend that you have screening and Pap tests more often.  If you have a family history of uterine cancer or ovarian cancer, talk with your health care provider about genetic screening.  If you have vaginal bleeding after reaching menopause, tell your health care provider.  There are currently no reliable tests available to screen for ovarian cancer. Lung Cancer  Lung cancer screening is recommended for adults 3-81 years old who are at high risk for lung cancer because of a history of smoking. A yearly low-dose CT scan of the lungs is recommended if you:  Currently smoke.  Have a history of at least 30 pack-years of smoking and you currently smoke or have quit within the past 15 years. A pack-year is smoking an average of one pack of cigarettes per day for one year. Yearly screening should:  Continue until it has been 15 years since you quit.  Stop if you develop a health problem that would prevent you from having lung cancer treatment. Colorectal Cancer  This type of cancer can be  detected and can often be prevented.  Routine colorectal cancer screening usually begins at age 40 and continues through age 25.  If you have risk factors for colon cancer, your health care provider may recommend that you be screened at an earlier age.  If you have a family history of colorectal cancer, talk with your health care provider about genetic screening.  Your health care provider may also recommend using home test kits to check for hidden blood in your stool.  A small camera at the end of a tube can be used to examine your colon directly (sigmoidoscopy or colonoscopy). This is done to check for the earliest  forms of colorectal cancer.  Direct examination of the colon should be repeated every 5-10 years until age 22. However, if early forms of precancerous polyps or small growths are found or if you have a family history or genetic risk for colorectal cancer, you may need to be screened more often. Skin Cancer  Check your skin from head to toe regularly.  Monitor any moles. Be sure to tell your health care provider:  About any new moles or changes in moles, especially if there is a change in a mole's shape or color.  If you have a mole that is larger than the size of a pencil eraser.  If any of your family members has a history of skin cancer, especially at a young age, talk with your health care provider about genetic screening.  Always use sunscreen. Apply sunscreen liberally and repeatedly throughout the day.  Whenever you are outside, protect yourself by wearing long sleeves, pants, a wide-brimmed hat, and sunglasses. What should I know about osteoporosis? Osteoporosis is a condition in which bone destruction happens more quickly than new bone creation. After menopause, you may be at an increased risk for osteoporosis. To help prevent osteoporosis or the bone fractures that can happen because of osteoporosis, the following is recommended:  If you are 99-60 years old, get at least 1,000 mg of calcium and at least 600 mg of vitamin D per day.  If you are older than age 67 but younger than age 61, get at least 1,200 mg of calcium and at least 600 mg of vitamin D per day.  If you are older than age 70, get at least 1,200 mg of calcium and at least 800 mg of vitamin D per day. Smoking and excessive alcohol intake increase the risk of osteoporosis. Eat foods that are rich in calcium and vitamin D, and do weight-bearing exercises several times each week as directed by your health care provider. What should I know about how menopause affects my mental health? Depression may occur at any age, but  it is more common as you become older. Common symptoms of depression include:  Low or sad mood.  Changes in sleep patterns.  Changes in appetite or eating patterns.  Feeling an overall lack of motivation or enjoyment of activities that you previously enjoyed.  Frequent crying spells. Talk with your health care provider if you think that you are experiencing depression. What should I know about immunizations? It is important that you get and maintain your immunizations. These include:  Tetanus, diphtheria, and pertussis (Tdap) booster vaccine.  Influenza every year before the flu season begins.  Pneumonia vaccine.  Shingles vaccine. Your health care provider may also recommend other immunizations. This information is not intended to replace advice given to you by your health care provider. Make sure you discuss any questions you  have with your health care provider. Document Released: 10/02/2005 Document Revised: 02/28/2016 Document Reviewed: 05/14/2015  2017 [employer]vier

## 2016-10-14 NOTE — Assessment & Plan Note (Signed)
Diltiazem

## 2016-10-14 NOTE — Assessment & Plan Note (Signed)
On B12 

## 2016-10-14 NOTE — Addendum Note (Signed)
Addended by: Valere Dross on: 10/14/2016 10:52 AM   Modules accepted: Orders

## 2016-10-23 ENCOUNTER — Other Ambulatory Visit (INDEPENDENT_AMBULATORY_CARE_PROVIDER_SITE_OTHER): Payer: Medicare Other

## 2016-10-23 DIAGNOSIS — Z0000 Encounter for general adult medical examination without abnormal findings: Secondary | ICD-10-CM

## 2016-10-23 DIAGNOSIS — E785 Hyperlipidemia, unspecified: Secondary | ICD-10-CM | POA: Diagnosis not present

## 2016-10-23 DIAGNOSIS — E538 Deficiency of other specified B group vitamins: Secondary | ICD-10-CM | POA: Diagnosis not present

## 2016-10-23 DIAGNOSIS — Z23 Encounter for immunization: Secondary | ICD-10-CM

## 2016-10-23 LAB — URINALYSIS, ROUTINE W REFLEX MICROSCOPIC
BILIRUBIN URINE: NEGATIVE
HGB URINE DIPSTICK: NEGATIVE
Ketones, ur: NEGATIVE
NITRITE: NEGATIVE
RBC / HPF: NONE SEEN (ref 0–?)
Specific Gravity, Urine: 1.02 (ref 1.000–1.030)
TOTAL PROTEIN, URINE-UPE24: NEGATIVE
Urine Glucose: NEGATIVE
Urobilinogen, UA: 0.2 (ref 0.0–1.0)
pH: 6 (ref 5.0–8.0)

## 2016-10-23 LAB — TSH: TSH: 0.97 u[IU]/mL (ref 0.35–4.50)

## 2016-10-23 LAB — CBC WITH DIFFERENTIAL/PLATELET
BASOS ABS: 0 10*3/uL (ref 0.0–0.1)
Basophils Relative: 0.5 % (ref 0.0–3.0)
EOS PCT: 1.5 % (ref 0.0–5.0)
Eosinophils Absolute: 0.1 10*3/uL (ref 0.0–0.7)
HCT: 38.9 % (ref 36.0–46.0)
Hemoglobin: 13.1 g/dL (ref 12.0–15.0)
LYMPHS ABS: 2.8 10*3/uL (ref 0.7–4.0)
Lymphocytes Relative: 38.6 % (ref 12.0–46.0)
MCHC: 33.7 g/dL (ref 30.0–36.0)
MCV: 91.2 fl (ref 78.0–100.0)
MONO ABS: 0.5 10*3/uL (ref 0.1–1.0)
Monocytes Relative: 6.5 % (ref 3.0–12.0)
NEUTROS ABS: 3.9 10*3/uL (ref 1.4–7.7)
Neutrophils Relative %: 52.9 % (ref 43.0–77.0)
PLATELETS: 334 10*3/uL (ref 150.0–400.0)
RBC: 4.26 Mil/uL (ref 3.87–5.11)
RDW: 13.6 % (ref 11.5–15.5)
WBC: 7.3 10*3/uL (ref 4.0–10.5)

## 2016-10-23 LAB — HEMOGLOBIN A1C: HEMOGLOBIN A1C: 5.5 % (ref 4.6–6.5)

## 2016-10-23 LAB — BASIC METABOLIC PANEL
BUN: 12 mg/dL (ref 6–23)
CALCIUM: 10.1 mg/dL (ref 8.4–10.5)
CO2: 30 mEq/L (ref 19–32)
CREATININE: 1.05 mg/dL (ref 0.40–1.20)
Chloride: 107 mEq/L (ref 96–112)
GFR: 67.07 mL/min (ref >60.00)
Glucose, Bld: 98 mg/dL (ref 70–99)
Potassium: 4.1 mEq/L (ref 3.5–5.1)
SODIUM: 141 meq/L (ref 135–145)

## 2016-10-23 LAB — LIPID PANEL
CHOLESTEROL: 238 mg/dL — AB (ref 0–200)
HDL: 47.9 mg/dL (ref >39.00)
LDL Cholesterol: 166 mg/dL — ABNORMAL HIGH (ref 0–99)
NonHDL: 189.8
TRIGLYCERIDES: 118 mg/dL (ref 0.0–149.0)
Total CHOL/HDL Ratio: 5
VLDL: 23.6 mg/dL (ref 0.0–40.0)

## 2016-10-23 LAB — VITAMIN B12: VITAMIN B 12: 900 pg/mL (ref 211–911)

## 2016-10-23 LAB — HEPATIC FUNCTION PANEL
ALBUMIN: 4 g/dL (ref 3.5–5.2)
ALT: 15 U/L (ref 0–35)
AST: 15 U/L (ref 0–37)
Alkaline Phosphatase: 58 U/L (ref 39–117)
Bilirubin, Direct: 0.1 mg/dL (ref 0.0–0.3)
Total Bilirubin: 0.4 mg/dL (ref 0.2–1.2)
Total Protein: 7.2 g/dL (ref 6.0–8.3)

## 2016-11-02 ENCOUNTER — Telehealth: Payer: Self-pay | Admitting: Internal Medicine

## 2016-11-02 NOTE — Telephone Encounter (Signed)
Pt called stating that someone called her on Friday about an antibiotic for a UTI. She said that she does need this prescription called in to [facility] on [address]. Please advise. Thanks [employer]

## 2016-11-03 ENCOUNTER — Telehealth: Payer: Self-pay

## 2016-11-03 MED ORDER — CIPROFLOXACIN HCL 250 MG PO TABS: 250.0000 mg | ORAL_TABLET | Freq: Two times a day (BID) | ORAL | 0 refills | Status: DC

## 2016-11-03 NOTE — Addendum Note (Signed)
Addended by: Cassandria Anger on: 11/03/2016 09:09 PM   Modules accepted: Orders

## 2016-11-03 NOTE — Telephone Encounter (Signed)
Patient states she is now symptomatic of UTI---had urine lab on 10/23/16---routing to dr plotnikov---can you please send abx to pharm---please advise, I will call patient back, thanks

## 2016-11-03 NOTE — Telephone Encounter (Signed)
OK Cipro - Rx emailed Thx

## 2016-11-04 ENCOUNTER — Encounter: Payer: Self-pay | Admitting: Podiatry

## 2016-11-04 ENCOUNTER — Ambulatory Visit (INDEPENDENT_AMBULATORY_CARE_PROVIDER_SITE_OTHER): Payer: Medicare Other | Admitting: Podiatry

## 2016-11-04 VITALS — BP 136/83 | HR 71 | Resp 18

## 2016-11-04 DIAGNOSIS — L603 Nail dystrophy: Secondary | ICD-10-CM | POA: Diagnosis not present

## 2016-11-04 NOTE — Telephone Encounter (Signed)
Advised patient that abx, cipro has been sent to [facility]

## 2016-11-04 NOTE — Progress Notes (Signed)
   Subjective:    Patient ID: Joann Fields, female    DOB: 1951/01/17, 67 y.o.   MRN: [id number]  HPI  66 year old female presents the office today for concerns of her left second toenail becoming thick, discolored with yellow to brown discoloration. She states the other toenails are starting to do the same but the left second distal worse. She denies any pain to the area and denies any surrounding redness, drainage or any other signs of infection. No recent treatment for this. This has been ongoing for years. No other complaints today.   Review of Systems  All other systems reviewed and are negative.      Objective:   Physical Exam General: AAO x3, NAD  Dermatological: Nails appear to be somewhat dystrophic, discolored, hypertrophic particularly the left second digit toenail which is the worse. There is no swelling redness, drainage or any signs of infection. No open lesions are identified today.  Vascular: Dorsalis Pedis artery and Posterior Tibial artery pedal pulses are 2/4 bilateral with immedate capillary fill time. Pedal hair growth present.  There is no pain with calf compression, swelling, warmth, erythema.   Neruologic: Grossly intact via light touch bilateral. Vibratory intact via tuning fork bilateral. Protective threshold with Semmes Wienstein monofilament intact to all pedal sites bilateral.   Musculoskeletal: No gross boney pedal deformities bilateral. No pain, crepitus, or limitation noted with foot and ankle range of motion bilateral. Muscular strength 5/5 in all groups tested bilateral.  Gait: Unassisted, Nonantalgic.     Assessment & Plan:  66 year old female with onychodystrophy, likely onychomycosis -Treatment options discussed including all alternatives, risks, and complications -Etiology of symptoms were discussed -Nails debrided and sent for culture/pathology to [facility] labs. Discussed treatment options and will likely do lamisil. Discussed side affects of this  medication and alternatives.  -RTC after nail culture or sooner if any issues are to arise.   Celesta Gentile, DPM

## 2016-11-06 NOTE — Addendum Note (Signed)
Addended by: Cranford Mon R on: 11/06/2016 07:43 AM   Modules accepted: Orders

## 2016-11-16 ENCOUNTER — Other Ambulatory Visit: Payer: Self-pay | Admitting: Internal Medicine

## 2016-11-25 ENCOUNTER — Encounter: Payer: Self-pay | Admitting: Gastroenterology

## 2016-11-25 ENCOUNTER — Ambulatory Visit (AMBULATORY_SURGERY_CENTER): Payer: Self-pay

## 2016-11-25 VITALS — Ht 65.0 in | Wt 159.4 lb

## 2016-11-25 DIAGNOSIS — Z1211 Encounter for screening for malignant neoplasm of colon: Secondary | ICD-10-CM

## 2016-11-25 MED ORDER — SUPREP BOWEL PREP KIT 17.5-3.13-1.6 GM/177ML PO SOLN: 1.0000 | Freq: Once | ORAL | 0 refills | Status: AC

## 2016-11-25 NOTE — Progress Notes (Signed)
No allergies to eggs or soy No past problems with anesthesia No home oxygen No diet meds  Declined emmi 

## 2016-12-07 ENCOUNTER — Ambulatory Visit (INDEPENDENT_AMBULATORY_CARE_PROVIDER_SITE_OTHER): Payer: Medicare Other | Admitting: Podiatry

## 2016-12-07 ENCOUNTER — Encounter: Payer: Self-pay | Admitting: Podiatry

## 2016-12-07 DIAGNOSIS — B351 Tinea unguium: Secondary | ICD-10-CM

## 2016-12-09 ENCOUNTER — Ambulatory Visit (AMBULATORY_SURGERY_CENTER): Payer: Medicare Other | Admitting: Gastroenterology

## 2016-12-09 ENCOUNTER — Encounter: Payer: Self-pay | Admitting: Gastroenterology

## 2016-12-09 VITALS — BP 139/87 | HR 73 | Temp 97.8°F | Resp 18 | Ht 65.0 in | Wt 159.0 lb

## 2016-12-09 DIAGNOSIS — Z1211 Encounter for screening for malignant neoplasm of colon: Secondary | ICD-10-CM | POA: Diagnosis not present

## 2016-12-09 DIAGNOSIS — D122 Benign neoplasm of ascending colon: Secondary | ICD-10-CM

## 2016-12-09 DIAGNOSIS — Z1212 Encounter for screening for malignant neoplasm of rectum: Secondary | ICD-10-CM | POA: Diagnosis not present

## 2016-12-09 DIAGNOSIS — D124 Benign neoplasm of descending colon: Secondary | ICD-10-CM | POA: Diagnosis not present

## 2016-12-09 DIAGNOSIS — D120 Benign neoplasm of cecum: Secondary | ICD-10-CM

## 2016-12-09 MED ORDER — SODIUM CHLORIDE 0.9 % IV SOLN: 500.0000 mL | INTRAVENOUS | Status: DC

## 2016-12-09 NOTE — Op Note (Signed)
[facility] Patient Name: Joann Fields Procedure Date: 12/09/2016 9:55 AM MRN: [id number] Endoscopist: Milus Banister , MD Age: 66 Referring MD:  Date of Birth: 05-05-1951 Gender: Female Account #: [id number] Procedure:                Colonoscopy Indications:              Screening for colorectal malignant neoplasm;                            colonoscopy 2008 was normal Medicines:                Monitored Anesthesia Care Procedure:                Pre-Anesthesia Assessment:                           - Prior to the procedure, a History and Physical                            was performed, and patient medications and                            allergies were reviewed. The patient's tolerance of                            previous anesthesia was also reviewed. The risks                            and benefits of the procedure and the sedation                            options and risks were discussed with the patient.                            All questions were answered, and informed consent                            was obtained. Prior Anticoagulants: The patient has                            taken no previous anticoagulant or antiplatelet                            agents. ASA Grade Assessment: II - A patient with                            mild systemic disease. After reviewing the risks                            and benefits, the patient was deemed in                            satisfactory condition to undergo the procedure.  After obtaining informed consent, the colonoscope                            was passed under direct vision. Throughout the                            procedure, the patient's blood pressure, pulse, and                            oxygen saturations were monitored continuously. The                            Colonoscope was introduced through the anus and                            advanced to the the cecum, identified  by                            appendiceal orifice and ileocecal valve. The                            colonoscopy was performed without difficulty. The                            patient tolerated the procedure well. The quality                            of the bowel preparation was excellent. The                            ileocecal valve, appendiceal orifice, and rectum                            were photographed. Scope In: 10:00:01 AM Scope Out: 10:13:42 AM Scope Withdrawal Time: 0 hours 10 minutes 45 seconds  Total Procedure Duration: 0 hours 13 minutes 41 seconds  Findings:                 Three sessile polyps were found in the descending                            colon, ascending colon and cecum. The polyps were 3                            to 9 mm in size. These polyps were removed with a                            cold snare. Resection and retrieval were complete.                           The exam was otherwise without abnormality on                            direct and retroflexion views. Complications:  No immediate complications. Estimated blood loss:                            None. Estimated Blood Loss:     Estimated blood loss: none. Impression:               - Three 3 to 9 mm polyps in the descending colon,                            in the ascending colon and in the cecum, removed                            with a cold snare. Resected and retrieved.                           - The examination was otherwise normal on direct                            and retroflexion views. Recommendation:           - Patient has a contact number available for                            emergencies. The signs and symptoms of potential                            delayed complications were discussed with the                            patient. Return to normal activities tomorrow.                            Written discharge instructions were provided to the                             patient.                           - Resume previous diet.                           - Continue present medications.                           You will receive a letter within 2-3 weeks with the                            pathology results and my final recommendations.                           If the polyp(s) is proven to be 'pre-cancerous' on                            pathology, you will need repeat colonoscopy in 3  years. If the polyp(s) is NOT 'precancerous' on                            pathology then you should repeat colon cancer                            screening in 10 years with colonoscopy without need                            for colon cancer screening by any method prior to                            then (including stool testing). Milus Banister, MD 12/09/2016 10:16:51 AM This report has been signed electronically.

## 2016-12-09 NOTE — Progress Notes (Signed)
Called to room to assist during endoscopic procedure.  Patient ID and intended procedure confirmed with present staff. Received instructions for my participation in the procedure from the performing physician.  

## 2016-12-09 NOTE — Patient Instructions (Signed)
YOU HAD AN ENDOSCOPIC PROCEDURE TODAY AT THE [facility]:   Refer to the procedure report that was given to you for any specific questions about what was found during the examination.  If the procedure report does not answer your questions, please call your gastroenterologist to clarify.  If you requested that your care partner not be given the details of your procedure findings, then the procedure report has been included in a sealed envelope for you to review at your convenience later.  YOU SHOULD EXPECT: Some feelings of bloating in the abdomen. Passage of more gas than usual.  Walking can help get rid of the air that was put into your GI tract during the procedure and reduce the bloating. If you had a lower endoscopy (such as a colonoscopy or flexible sigmoidoscopy) you may notice spotting of blood in your stool or on the toilet paper. If you underwent a bowel prep for your procedure, you may not have a normal bowel movement for a few days.  Please Note:  You might notice some irritation and congestion in your nose or some drainage.  This is from the oxygen used during your procedure.  There is no need for concern and it should clear up in a day or so.  SYMPTOMS TO REPORT IMMEDIATELY:   Following lower endoscopy (colonoscopy or flexible sigmoidoscopy):  Excessive amounts of blood in the stool  Significant tenderness or worsening of abdominal pains  Swelling of the abdomen that is new, acute  Fever of 100F or higher    For urgent or emergent issues, a gastroenterologist can be reached at any hour by calling [phone].   DIET:  We do recommend a small meal at first, but then you may proceed to your regular diet.  Drink plenty of fluids but you should avoid alcoholic beverages for 24 hours.  ACTIVITY:  You should plan to take it easy for the rest of today and you should NOT DRIVE or use heavy machinery until tomorrow (because of the sedation medicines used during the test).     FOLLOW UP: Our staff will call the number listed on your records the next business day following your procedure to check on you and address any questions or concerns that you may have regarding the information given to you following your procedure. If we do not reach you, we will leave a message.  However, if you are feeling well and you are not experiencing any problems, there is no need to return our call.  We will assume that you have returned to your regular daily activities without incident.  If any biopsies were taken you will be contacted by phone or by letter within the next 1-3 weeks.  Please call us at [phone] if you have not heard about the biopsies in 3 weeks.    SIGNATURES/CONFIDENTIALITY: You and/or your care partner have signed paperwork which will be entered into your electronic medical record.  These signatures attest to the fact that that the information above on your After Visit Summary has been reviewed and is understood.  Full responsibility of the confidentiality of this discharge information lies with you and/or your care-partner.   Resume medications. Information given on polyps. 

## 2016-12-09 NOTE — Progress Notes (Signed)
Pt's states no medical or surgical changes since previsit or office visit. 

## 2016-12-09 NOTE — Progress Notes (Signed)
Report to PACU, RN, vss, BBS= Clear.  

## 2016-12-09 NOTE — Progress Notes (Signed)
Subjective: 66 year old female presents the also discussed nail culture results. He denies any acute changes since last appointment. She denies any redness or drainage or any swelling along the toenail. Denies any systemic complaints such as fevers, chills, nausea, vomiting. No acute changes since last appointment, and no other complaints at this time.   Objective: AAO x3, NAD DP/PT pulses palpable bilaterally, CRT less than 3 seconds Nails continue to be somewhat dystrophic, discolored and hypertrophic with yellow to brown discoloration most with the left second toe. No pain, swelling, redness or any drainage or any clinical signs of infection. No open lesions or pre-ulcerative lesions.  No pain with calf compression, swelling, warmth, erythema  Assessment: Onychomycosis  Plan: -All treatment options discussed with the patient including all alternatives, risks, complications.  -Discussed lab results as well as treatment options. After discussion she was to proceed with topical medication. Prescribed urea cream with topical antifungal. This was ordered through [employer]. Discussed success rate and application instructions.  -RTC prn -Patient encouraged to call the office with any questions, concerns, change in symptoms.   Celesta Gentile, DPM

## 2016-12-10 ENCOUNTER — Telehealth: Payer: Self-pay | Admitting: *Deleted

## 2016-12-10 NOTE — Telephone Encounter (Signed)
  Follow up Call-  Call back number 12/09/2016  Post procedure Call Back phone  # [phone]  Permission to leave phone message Yes  Some recent data might be hidden     Patient questions:  Do you have a fever, pain , or abdominal swelling? No. Pain Score  0 *  Have you tolerated food without any problems? Yes.    Have you been able to return to your normal activities? Yes.    Do you have any questions about your discharge instructions: Diet   No. Medications  No. Follow up visit  No.  Do you have questions or concerns about your Care? No.  Actions: * If pain score is 4 or above: No action needed, pain <4.

## 2016-12-11 ENCOUNTER — Telehealth: Payer: Self-pay | Admitting: *Deleted

## 2016-12-11 MED ORDER — NONFORMULARY OR COMPOUNDED ITEM: 0 refills | Status: DC

## 2016-12-11 NOTE — Telephone Encounter (Signed)
Dr. Jacqualyn Posey ordered [place] onychomycosis nail lacquer + 20% Urea. Orders faxed to [facility].

## 2016-12-15 ENCOUNTER — Encounter: Payer: Self-pay | Admitting: Gastroenterology

## 2016-12-22 ENCOUNTER — Telehealth: Payer: Self-pay | Admitting: *Deleted

## 2016-12-22 NOTE — Telephone Encounter (Signed)
Pt states the medication Dr. Jacqualyn Posey ordered for her does not have instructions. Pt called again and I told her to apply the medication 1-2 times daily.

## 2017-04-22 ENCOUNTER — Other Ambulatory Visit: Payer: Self-pay | Admitting: Internal Medicine

## 2017-08-14 ENCOUNTER — Other Ambulatory Visit: Payer: Self-pay | Admitting: Internal Medicine

## 2017-09-06 LAB — HM MAMMOGRAPHY

## 2017-09-09 ENCOUNTER — Encounter: Payer: Self-pay | Admitting: Internal Medicine

## 2017-10-13 ENCOUNTER — Encounter: Payer: Self-pay | Admitting: Family

## 2017-10-13 ENCOUNTER — Ambulatory Visit: Payer: Medicare Other | Admitting: Family

## 2017-10-13 VITALS — BP 118/80 | HR 96 | Temp 98.6°F | Ht 65.0 in | Wt 157.1 lb

## 2017-10-13 DIAGNOSIS — J019 Acute sinusitis, unspecified: Secondary | ICD-10-CM

## 2017-10-13 MED ORDER — AZITHROMYCIN 250 MG PO TABS: ORAL_TABLET | ORAL | 0 refills | Status: DC

## 2017-10-13 MED ORDER — FLUTICASONE PROPIONATE 50 MCG/ACT NA SUSP: 2.0000 | Freq: Every day | NASAL | 6 refills | Status: DC

## 2017-10-13 NOTE — Progress Notes (Signed)
Joann Fields is a 67 y.o. female with the following history as recorded in EpicCare:  Patient Active Problem List   Diagnosis Date Noted  . Low back pain 10/14/2016  . Onychomycosis 10/14/2016  . Diverticulosis of colon without hemorrhage 11/14/2014  . B12 deficiency 09/24/2014  . URI, acute 09/24/2014  . Cerumen impaction 09/12/2013  . Well adult exam 08/08/2012  . Anosmia 08/08/2012  . SINUSITIS- ACUTE-NOS 03/07/2010  . HERNIA, VENTRAL 06/05/2009  . TOBACCO USE, QUIT 06/05/2009  . NECK PAIN 01/17/2009  . Dyslipidemia 10/20/2007  . Essential hypertension 10/20/2007  . OSTEOARTHRITIS 10/20/2007  . Osteoporosis 10/20/2007    Current Outpatient Medications  Medication Sig Dispense Refill  . CALCIUM-VITAMIN D PO Take 1 capsule by mouth daily. Takes 600 mg    . Cyanocobalamin (VITAMIN B-12) 1000 MCG SUBL PLACE 1 TABLET (1,000 MCG TOTAL) UNDER THE TONGUE DAILY. 100 tablet 1  . diltiazem (CARDIZEM CD) 180 MG 24 hr capsule TAKE 1 CAPSULE (180 MG TOTAL) BY MOUTH DAILY. 90 capsule 3  . NONFORMULARY OR COMPOUNDED ITEM [facility]:  Onychomycosis Nail Lacquer - Fluconazole 2%, Terbinafine 1%, DMSO, apply to affected area daily. 120 each 0  . ranitidine (ZANTAC) 150 MG tablet TAKE 1 TABLET (150 MG TOTAL) BY MOUTH 2 (TWO) TIMES DAILY. 60 tablet 2  . azithromycin (ZITHROMAX) 250 MG tablet 2 tabs po qd x 1 day; 1 tablet per day x 4 days; 6 tablet 0  . fluticasone (FLONASE) 50 MCG/ACT nasal spray Place 2 sprays into both nostrils daily. 16 g 6   Current Facility-Administered Medications  Medication Dose Route Frequency Provider Last Rate Last Dose  . 0.9 %  sodium chloride infusion  500 mL Intravenous Continuous Milus Banister, MD        Allergies: Amoxicillin  Past Medical History:  Diagnosis Date  . Cataract   . GERD (gastroesophageal reflux disease)   . Hyperlipidemia   . Hypertension   . Osteopenia     Past Surgical History:  Procedure Laterality Date  . COLONOSCOPY     . DG THUMB LEFT HAND     cut her hand while prepping chicken for supper  . SVD x 2    . WISDOM TOOTH EXTRACTION      Family History  Problem Relation Age of Onset  . Lung cancer Mother 11  . Heart disease Father 6       MI  . Alzheimer's disease Brother 25  . Early death Maternal Aunt   . Hearing loss Maternal Aunt   . Lymphoma Sister   . Colon cancer Neg Hx   . Colon polyps Neg Hx   . Esophageal cancer Neg Hx     Social History   Tobacco Use  . Smoking status: Never Smoker  . Smokeless tobacco: Never Used  . Tobacco comment: only in College  Substance Use Topics  . Alcohol use: Yes    Alcohol/week: 0.0 oz    Comment: Rarely; once yearly    Subjective:  Patient presents with concerns for possible sinus infection; symptoms x 1 week; + headache; + increased congestion at night; + teeth hurting; denies any chest pain or shortness of breath; using OTC cough/ cold medication with no benefit;    Objective:  Vitals:   10/13/17 1045  BP: 118/80  Pulse: 96  Temp: 98.6 F (37 C)  TempSrc: Oral  SpO2: 99%  Weight: 157 lb 1.9 oz (71.3 kg)  Height: 5\' 5"  (1.651 m)  General: Well developed, well nourished, in no acute distress  Skin : Warm and dry.  Head: Normocephalic and atraumatic  Eyes: Sclera and conjunctiva clear; pupils round and reactive to light; extraocular movements intact  Ears: External normal; canals clear; tympanic membranes congested bilaterally Oropharynx: Pink, supple. No suspicious lesions  Neck: Supple without thyromegaly, adenopathy  Lungs: Respirations unlabored; clear to auscultation bilaterally without wheeze, rales, rhonchi  CVS exam: normal rate and regular rhythm.  Neurologic: Alert and oriented; speech intact; face symmetrical; moves all extremities well; CNII-XII intact without focal deficit   Assessment:  1. Acute sinusitis, recurrence not specified, unspecified location     Plan:  Rx for Z-pak #1 take as directed; Rx for Flonase;  increase fluids, rest and follow- up worse, no better.   No Follow-up on file.  No orders of the defined types were placed in this encounter.   Requested Prescriptions   Signed Prescriptions Disp Refills  . azithromycin (ZITHROMAX) 250 MG tablet 6 tablet 0    Sig: 2 tabs po qd x 1 day; 1 tablet per day x 4 days;  . fluticasone (FLONASE) 50 MCG/ACT nasal spray 16 g 6    Sig: Place 2 sprays into both nostrils daily.

## 2017-10-15 ENCOUNTER — Encounter: Payer: Medicare Other | Admitting: Internal Medicine

## 2017-10-21 ENCOUNTER — Encounter: Payer: Medicare Other | Admitting: Internal Medicine

## 2017-11-08 ENCOUNTER — Encounter: Payer: Self-pay | Admitting: Internal Medicine

## 2017-11-08 ENCOUNTER — Other Ambulatory Visit (INDEPENDENT_AMBULATORY_CARE_PROVIDER_SITE_OTHER): Payer: Medicare Other

## 2017-11-08 ENCOUNTER — Ambulatory Visit (INDEPENDENT_AMBULATORY_CARE_PROVIDER_SITE_OTHER): Payer: Medicare Other | Admitting: Internal Medicine

## 2017-11-08 VITALS — BP 116/72 | HR 65 | Temp 98.4°F | Ht 65.0 in | Wt 157.0 lb

## 2017-11-08 DIAGNOSIS — E538 Deficiency of other specified B group vitamins: Secondary | ICD-10-CM | POA: Diagnosis not present

## 2017-11-08 DIAGNOSIS — J069 Acute upper respiratory infection, unspecified: Secondary | ICD-10-CM

## 2017-11-08 DIAGNOSIS — Z23 Encounter for immunization: Secondary | ICD-10-CM | POA: Diagnosis not present

## 2017-11-08 DIAGNOSIS — I10 Essential (primary) hypertension: Secondary | ICD-10-CM

## 2017-11-08 DIAGNOSIS — E785 Hyperlipidemia, unspecified: Secondary | ICD-10-CM

## 2017-11-08 DIAGNOSIS — E559 Vitamin D deficiency, unspecified: Secondary | ICD-10-CM

## 2017-11-08 DIAGNOSIS — Z0000 Encounter for general adult medical examination without abnormal findings: Secondary | ICD-10-CM | POA: Diagnosis not present

## 2017-11-08 LAB — LIPID PANEL
CHOL/HDL RATIO: 4
Cholesterol: 240 mg/dL — ABNORMAL HIGH (ref 0–200)
HDL: 53.5 mg/dL (ref >39.00)
LDL CALC: 156 mg/dL — AB (ref 0–99)
NONHDL: 186.73
TRIGLYCERIDES: 153 mg/dL — AB (ref 0.0–149.0)
VLDL: 30.6 mg/dL (ref 0.0–40.0)

## 2017-11-08 LAB — URINALYSIS, ROUTINE W REFLEX MICROSCOPIC
BILIRUBIN URINE: NEGATIVE
Hgb urine dipstick: NEGATIVE
KETONES UR: NEGATIVE
Nitrite: NEGATIVE
PH: 6 (ref 5.0–8.0)
RBC / HPF: NONE SEEN (ref 0–?)
SPECIFIC GRAVITY, URINE: 1.025 (ref 1.000–1.030)
Total Protein, Urine: NEGATIVE
Urine Glucose: NEGATIVE
Urobilinogen, UA: 0.2 (ref 0.0–1.0)

## 2017-11-08 LAB — CBC WITH DIFFERENTIAL/PLATELET
BASOS PCT: 0.7 % (ref 0.0–3.0)
Basophils Absolute: 0 10*3/uL (ref 0.0–0.1)
EOS PCT: 1.2 % (ref 0.0–5.0)
Eosinophils Absolute: 0.1 10*3/uL (ref 0.0–0.7)
HCT: 40.9 % (ref 36.0–46.0)
Hemoglobin: 13.8 g/dL (ref 12.0–15.0)
LYMPHS ABS: 2.1 10*3/uL (ref 0.7–4.0)
Lymphocytes Relative: 31.3 % (ref 12.0–46.0)
MCHC: 33.7 g/dL (ref 30.0–36.0)
MCV: 90.8 fl (ref 78.0–100.0)
MONO ABS: 0.5 10*3/uL (ref 0.1–1.0)
MONOS PCT: 7.7 % (ref 3.0–12.0)
NEUTROS ABS: 4 10*3/uL (ref 1.4–7.7)
NEUTROS PCT: 59.1 % (ref 43.0–77.0)
Platelets: 316 10*3/uL (ref 150.0–400.0)
RBC: 4.5 Mil/uL (ref 3.87–5.11)
RDW: 13.7 % (ref 11.5–15.5)
WBC: 6.8 10*3/uL (ref 4.0–10.5)

## 2017-11-08 LAB — HEPATIC FUNCTION PANEL
ALBUMIN: 4.2 g/dL (ref 3.5–5.2)
ALK PHOS: 65 U/L (ref 39–117)
ALT: 13 U/L (ref 0–35)
AST: 15 U/L (ref 0–37)
Bilirubin, Direct: 0.1 mg/dL (ref 0.0–0.3)
Total Bilirubin: 0.4 mg/dL (ref 0.2–1.2)
Total Protein: 7.6 g/dL (ref 6.0–8.3)

## 2017-11-08 LAB — VITAMIN B12: VITAMIN B 12: 898 pg/mL (ref 211–911)

## 2017-11-08 LAB — TSH: TSH: 1.31 u[IU]/mL (ref 0.35–4.50)

## 2017-11-08 LAB — BASIC METABOLIC PANEL
BUN: 16 mg/dL (ref 6–23)
CALCIUM: 10.3 mg/dL (ref 8.4–10.5)
CHLORIDE: 104 meq/L (ref 96–112)
CO2: 28 meq/L (ref 19–32)
CREATININE: 1.14 mg/dL (ref 0.40–1.20)
GFR: 60.81 mL/min (ref >60.00)
Glucose, Bld: 94 mg/dL (ref 70–99)
Potassium: 4.1 mEq/L (ref 3.5–5.1)
Sodium: 140 mEq/L (ref 135–145)

## 2017-11-08 LAB — VITAMIN D 25 HYDROXY (VIT D DEFICIENCY, FRACTURES): VITD: 35.69 ng/mL (ref 30.00–100.00)

## 2017-11-08 MED ORDER — DILTIAZEM HCL ER COATED BEADS 180 MG PO CP24: 180.0000 mg | ORAL_CAPSULE | Freq: Every day | ORAL | 3 refills | Status: DC

## 2017-11-08 MED ORDER — AZITHROMYCIN 250 MG PO TABS: ORAL_TABLET | ORAL | 0 refills | Status: DC

## 2017-11-08 MED ORDER — ZOSTER VAC RECOMB ADJUVANTED 50 MCG/0.5ML IM SUSR: 0.5000 mL | Freq: Once | INTRAMUSCULAR | 1 refills | Status: AC

## 2017-11-08 NOTE — Assessment & Plan Note (Signed)
On B12 Labs 

## 2017-11-08 NOTE — Assessment & Plan Note (Signed)
Diltiazem po

## 2017-11-08 NOTE — Progress Notes (Signed)
Subjective:  Patient ID: Joann Fields, female    DOB: 1950/12/22  Age: 67 y.o. MRN: [id number]  CC: No chief complaint on file.   HPI SANAM MARMO presents for a well exam C/o cramps F/u B12 def, HTN C/o R ear pain  Outpatient Medications Prior to Visit  Medication Sig Dispense Refill  . CALCIUM-VITAMIN D PO Take 1 capsule by mouth daily. Takes 600 mg    . Cyanocobalamin (VITAMIN B-12) 1000 MCG SUBL PLACE 1 TABLET (1,000 MCG TOTAL) UNDER THE TONGUE DAILY. 100 tablet 1  . fluticasone (FLONASE) 50 MCG/ACT nasal spray Place 2 sprays into both nostrils daily. 16 g 6  . NONFORMULARY OR COMPOUNDED ITEM [facility]:  Onychomycosis Nail Lacquer - Fluconazole 2%, Terbinafine 1%, DMSO, apply to affected area daily. 120 each 0  . ranitidine (ZANTAC) 150 MG tablet TAKE 1 TABLET (150 MG TOTAL) BY MOUTH 2 (TWO) TIMES DAILY. 60 tablet 2  . diltiazem (CARDIZEM CD) 180 MG 24 hr capsule TAKE 1 CAPSULE (180 MG TOTAL) BY MOUTH DAILY. 90 capsule 3  . azithromycin (ZITHROMAX) 250 MG tablet 2 tabs po qd x 1 day; 1 tablet per day x 4 days; (Patient not taking: Reported on 11/08/2017) 6 tablet 0   Facility-Administered Medications Prior to Visit  Medication Dose Route Frequency Provider Last Rate Last Dose  . 0.9 %  sodium chloride infusion  500 mL Intravenous Continuous Milus Banister, MD        ROS Review of Systems  Constitutional: Negative for activity change, appetite change, chills, fatigue and unexpected weight change.  HENT: Positive for ear pain. Negative for congestion, mouth sores and sinus pressure.   Eyes: Negative for visual disturbance.  Respiratory: Negative for cough and chest tightness.   Gastrointestinal: Negative for abdominal pain and nausea.  Genitourinary: Negative for difficulty urinating, frequency and vaginal pain.  Musculoskeletal: Positive for arthralgias and myalgias. Negative for back pain and gait problem.  Skin: Negative for pallor and rash.  Neurological:  Negative for dizziness, tremors, weakness, numbness and headaches.  Psychiatric/Behavioral: Negative for confusion and sleep disturbance.    Objective:  BP 116/72 (BP Location: Left Arm, Patient Position: Sitting, Cuff Size: Normal)   Pulse 65   Temp 98.4 F (36.9 C) (Oral)   Ht 5\' 5"  (1.651 m)   Wt 157 lb (71.2 kg)   SpO2 98%   BMI 26.13 kg/m   BP Readings from Last 3 Encounters:  11/08/17 116/72  10/13/17 118/80  12/09/16 139/87    Wt Readings from Last 3 Encounters:  11/08/17 157 lb (71.2 kg)  10/13/17 157 lb 1.9 oz (71.3 kg)  12/09/16 159 lb (72.1 kg)    Physical Exam  Constitutional: She appears well-developed. No distress.  HENT:  Head: Normocephalic.  Right Ear: External ear normal.  Left Ear: External ear normal.  Nose: Nose normal.  Mouth/Throat: Oropharynx is clear and moist.  Eyes: Conjunctivae are normal. Pupils are equal, round, and reactive to light. Right eye exhibits no discharge. Left eye exhibits no discharge.  Neck: Normal range of motion. Neck supple. No JVD present. No tracheal deviation present. No thyromegaly present.  Cardiovascular: Normal rate, regular rhythm and normal heart sounds.  Pulmonary/Chest: No stridor. No respiratory distress. She has no wheezes.  Abdominal: Soft. Bowel sounds are normal. She exhibits no distension and no mass. There is no tenderness. There is no rebound and no guarding.  Musculoskeletal: She exhibits no edema or tenderness.  Lymphadenopathy:    She  has no cervical adenopathy.  Neurological: She displays normal reflexes. No cranial nerve deficit. She exhibits normal muscle tone. Coordination normal.  Skin: No rash noted. No erythema.  Psychiatric: She has a normal mood and affect. Her behavior is normal. Judgment and thought content normal.  R TM - fluid Looks younger than stated age  Lab Results  Component Value Date   WBC 7.3 10/23/2016   HGB 13.1 10/23/2016   HCT 38.9 10/23/2016   PLT 334.0 10/23/2016    GLUCOSE 98 10/23/2016   CHOL 238 (H) 10/23/2016   TRIG 118.0 10/23/2016   HDL 47.90 10/23/2016   LDLDIRECT 165.3 09/21/2013   LDLCALC 166 (H) 10/23/2016   ALT 15 10/23/2016   AST 15 10/23/2016   NA 141 10/23/2016   K 4.1 10/23/2016   CL 107 10/23/2016   CREATININE 1.05 10/23/2016   BUN 12 10/23/2016   CO2 30 10/23/2016   TSH 0.97 10/23/2016   HGBA1C 5.5 10/23/2016    Dg Bone Density (dxa)  Result Date: 08/11/2016 EXAM: DUAL X-RAY ABSORPTIOMETRY (DXA) FOR BONE MINERAL DENSITY IMPRESSION: Referring Physician:  Brien Few PATIENT: Name: Joann Fields, Joann Fields Patient ID: [id number] Birth Date: Aug 03, 1951 Height: 66.0 in. Sex: Female Measured: 08/11/2016 Weight: 159.0 lbs. Indications: Estrogen Deficient, Low Calcium Intake (269.3), Postmenopausal Fractures: None Treatments: Vitamin D (E933.5) ASSESSMENT: The BMD measured at Femur Neck Right is 0.887 g/cm2 with a T-score of -1.1. This patient is considered osteopenic according to [facility]) criteria. There has been a statistically significant increase in BMD of Lumbar spine since prior exam dated 08/14/2014. Site Region Measured Date Measured Age YA BMD Significant CHANGE T-score DualFemur Neck Right 08/11/2016    67.5         -1.1    0.887 g/cm2 AP Spine L1-L4 08/11/2016 67.5 -1.0 1.070 g/cm2 * World Health Organization [facility]) criteria for post-menopausal, Caucasian Women: Normal       T-score at or above -1 SD Osteopenia   T-score between -1 and -2.5 SD Osteoporosis T-score at or below -2.5 SD RECOMMENDATION: [place] recommends that FDA-approved medical therapies be considered in postmenopausal women and men age 79 or older with a: 1. Hip or vertebral (clinical or morphometric) fracture. 2. T-score of <-2.5 at the spine or hip. 3. Ten-year fracture probability by FRAX of 3% or greater for hip fracture or 20% or greater for major osteoporotic fracture. All treatment decisions require clinical judgment and  consideration of individual patient factors, including patient preferences, co-morbidities, previous drug use, risk factors not captured in the FRAX model (e.g. falls, vitamin D deficiency, increased bone turnover, interval significant decline in bone density) and possible under - or over-estimation of fracture risk by FRAX. All patients should ensure an adequate intake of dietary calcium (1200 mg/d) and vitamin D (800 IU daily) unless contraindicated. FOLLOW-UP: People with diagnosed cases of osteoporosis or at high risk for fracture should have regular bone mineral density tests. For patients eligible for Medicare, routine testing is allowed once every 2 years. The testing frequency can be increased to one year for patients who have rapidly progressing disease, those who are receiving or discontinuing medical therapy to restore bone mass, or have additional risk factors. I have reviewed this report, and agree with the above findings. [facility] FRAX* 10-year Probability of Fracture Based on femoral neck BMD: DualFemur (Right) Major Osteoporotic Fracture: 3.9% Hip Fracture:                0.4% Population:  [place] (Black) Risk Factors:                None *FRAX is a Materials engineer of the [employer] for Metabolic Bone Disease, a [place] (WHO) [facility]. ASSESSMENT: The probability of a major osteoporotic fracture is 3.9 % within the next ten years. The probability of a hip fracture is 0.4 % within the next ten years. Electronically Signed   By: Lowella Grip III M.D.   On: 08/11/2016 09:40    Assessment & Plan:   There are no diagnoses linked to this encounter. I have discontinued Faithlyn C. Brawley's azithromycin. I am also having her start on Zoster Vaccine Adjuvanted. Additionally, I am having her maintain her ranitidine, CALCIUM-VITAMIN D PO, NONFORMULARY OR COMPOUNDED ITEM, Vitamin B-12, fluticasone, and diltiazem. We  will continue to administer sodium chloride.  Meds ordered this encounter  Medications  . Zoster Vaccine Adjuvanted [facility]) injection    Sig: Inject 0.5 mLs into the muscle once for 1 dose. Repeat in  6 months once.    Dispense:  0.5 mL    Refill:  1  . diltiazem (CARDIZEM CD) 180 MG 24 hr capsule    Sig: Take 1 capsule (180 mg total) by mouth daily.    Dispense:  90 capsule    Refill:  3     Follow-up: No Follow-up on file.  Walker Kehr, MD

## 2017-11-08 NOTE — Assessment & Plan Note (Signed)
R OM - repeat Z pac (Rx given) if not better w/Flonase

## 2017-11-08 NOTE — Patient Instructions (Addendum)
Continue doing brain stimulating activities (puzzles, reading, adult coloring books, staying active) to keep memory sharp.   Continue to eat heart healthy diet (full of fruits, vegetables, whole grains, lean protein, water--limit salt, fat, and sugar intake) and increase physical activity as tolerated.  www.auntbertha.com or down load app on smart phone  Joann Fields website lists multiple social resources for individuals such as: food, health, money, house hold goods, transit, medical supplies, job training and legal services.     Joann Fields , Thank you for taking time to come for your Medicare Wellness Visit. I appreciate your ongoing commitment to your health goals. Please review the following plan we discussed and let me know if I can assist you in the future.   These are the goals we discussed: Goals    . Patient Stated     Increase the amount of water I drink daily. I will start to flavor my water to help with the taste. Rehearse my speeches, write them and practice ahead of time.       This is a list of the screening recommended for you and due dates:  Health Maintenance  Topic Date Due  . Flu Shot  03/24/2017  . Pneumonia vaccines (2 of 2 - PPSV23) 10/14/2017  . Mammogram  09/06/2018  . Colon Cancer Screening  12/10/2019  . Tetanus Vaccine  05/14/2020  . DEXA scan (bone density measurement)  Completed  .  Hepatitis C: One time screening is recommended by Center for Disease Control  (CDC) for  adults born from [birth year] through 1967.   Completed    Protein Content in Foods Generally, most healthy people need around 50 grams of protein each day. Depending on your overall health, you may need more or less protein in your diet. Talk to your health care provider or dietitian about how much protein you need. See the following list for the protein content of some common foods. High-protein foods High-protein foods contain 4 grams (4 g) or more of protein per serving. They  include:  Beef, ground sirloin (cooked) - 3 oz have 24 g of protein.  Cheese (hard) - 1 oz has 7 g of protein.  Chicken breast, boneless and skinless (cooked) - 3 oz have 13.4 g of protein.  Cottage cheese - 1/2 cup has 13.4 g of protein.  Egg - 1 egg has 6 g of protein.  Fish, filet (cooked) - 1 oz has 6-7 g of protein.  Garbanzo beans (canned or cooked) - 1/2 cup has 6-7 g of protein.  Kidney beans (canned or cooked) - 1/2 cup has 6-7 g of protein.  Lamb (cooked) - 3 oz has 24 g of protein.  Milk - 1 cup (8 oz) has 8 g of protein.  Nuts (peanuts, pistachios, almonds) - 1 oz has 6 g of protein.  Peanut butter - 1 oz has 7-8 g of protein.  Pork tenderloin (cooked) - 3 oz has 18.4 g of protein.  Pumpkin seeds - 1 oz has 8.5 g of protein.  Soybeans (roasted) - 1 oz has 8 g of protein.  Soybeans (cooked) - 1/2 cup has 11 g of protein.  Soy milk - 1 cup (8 oz) has 5-10 g of protein.  Soy or vegetable patty - 1 patty has 11 g of protein.  Sunflower seeds - 1 oz has 5.5 g of protein.  Tofu (firm) - 1/2 cup has 20 g of protein.  Tuna (canned in water) - 3 oz has 20  g of protein.  Yogurt - 6 oz has 8 g of protein.  Low-protein foods Low-protein foods contain 3 grams (3 g) or less of protein per serving. They include:  Beets (raw or cooked) - 1/2 cup has 1.5 g of protein.  Bran cereal - 1/2 cup has 2-3 g of protein.  Bread - 1 slice has 2.5 g of protein.  Broccoli (raw or cooked) - 1/2 cup has 2 g of protein.  Collard greens (raw or cooked) - 1/2 cup has 2 g of protein.  Corn (fresh or cooked) - 1/2 cup has 2 g of protein.  Cream cheese - 1 oz has 2 g of protein.  Creamer (half-and-half) - 1 oz has 1 g of protein.  Flour tortilla - 1 tortilla has 2.5 g of protein  Frozen yogurt - 1/2 cup has 3 g of protein.  Fruit or vegetable juice - 1/2 cup has 1 g of protein.  Green beans (raw or cooked) - 1/2 cup has 1 g of protein.  Green peas (canned) - 1/2 cup  has 3.5 g of protein.  Muffins - 1 small muffin (2 oz) has 3 g of protein.  Oatmeal (cooked) - 1/2 cup has 3 g of protein.  Potato (baked with skin) - 1 medium potato has 3 g of protein.  Rice (cooked) - 1/2 cup has 2.5-3.5 g of protein.  Sour cream - 1/2 cup has 2.5 g of protein.  Spinach (cooked) - 1/2 cup has 3 g of protein.  Squash (cooked) - 1/2 cup has 1.5 g of protein.  Actual amounts of protein may be different depending on processing. Talk with your health care provider or dietitian about what foods are recommended for you. This information is not intended to replace advice given to you by your health care provider. Make sure you discuss any questions you have with your health care provider. Document Released: 11/09/2015 Document Revised: 04/20/2016 Document Reviewed: 04/20/2016 Elsevier Interactive Patient Education  2018 [employer].  High-Fiber Diet Fiber, also called dietary fiber, is a type of carbohydrate found in fruits, vegetables, whole grains, and beans. A high-fiber diet can have many health benefits. Your health care provider may recommend a high-fiber diet to help:  Prevent constipation. Fiber can make your bowel movements more regular.  Lower your cholesterol.  Relieve hemorrhoids, uncomplicated diverticulosis, or irritable bowel syndrome.  Prevent overeating as part of a weight-loss plan.  Prevent heart disease, type 2 diabetes, and certain cancers.  What is my plan? The recommended daily intake of fiber includes:  38 grams for men under age 37.  84 grams for men over age 53.  14 grams for women under age 88.  91 grams for women over age 52.  You can get the recommended daily intake of dietary fiber by eating a variety of fruits, vegetables, grains, and beans. Your health care provider may also recommend a fiber supplement if it is not possible to get enough fiber through your diet. What do I need to know about a high-fiber diet?  Fiber  supplements have not been widely studied for their effectiveness, so it is better to get fiber through food sources.  Always check the fiber content on thenutrition facts label of any prepackaged food. Look for foods that contain at least 5 grams of fiber per serving.  Ask your dietitian if you have questions about specific foods that are related to your condition, especially if those foods are not listed in the following section.  Increase your daily fiber consumption gradually. Increasing your intake of dietary fiber too quickly may cause bloating, cramping, or gas.  Drink plenty of water. Water helps you to digest fiber. What foods can I eat? Grains Whole-grain breads. Multigrain cereal. Oats and oatmeal. Brown rice. Barley. Bulgur wheat. [place]. Bran muffins. Popcorn. Rye wafer crackers. Vegetables Sweet potatoes. Spinach. Kale. Artichokes. Cabbage. Broccoli. Green peas. Carrots. Squash. Fruits Berries. Pears. Apples. Oranges. Avocados. Prunes and raisins. Dried figs. Meats and Other Protein Sources [place], kidney, pinto, and soy beans. Split peas. Lentils. Nuts and seeds. Dairy Fiber-fortified yogurt. Beverages Fiber-fortified soy milk. Fiber-fortified orange juice. Other Fiber bars. The items listed above may not be a complete list of recommended foods or beverages. Contact your dietitian for more options. What foods are not recommended? Grains White bread. Pasta made with refined flour. White rice. Vegetables Fried potatoes. Canned vegetables. Well-cooked vegetables. Fruits Fruit juice. Cooked, strained fruit. Meats and Other Protein Sources Fatty cuts of meat. Fried Sales executive or fried fish. Dairy Milk. Yogurt. Cream cheese. Sour cream. Beverages Soft drinks. Other Cakes and pastries. Butter and oils. The items listed above may not be a complete list of foods and beverages to avoid. Contact your dietitian for more information. What are some tips for including high-fiber  foods in my diet?  Eat a wide variety of high-fiber foods.  Make sure that half of all grains consumed each day are whole grains.  Replace breads and cereals made from refined flour or white flour with whole-grain breads and cereals.  Replace white rice with brown rice, bulgur wheat, or millet.  Start the day with a breakfast that is high in fiber, such as a cereal that contains at least 5 grams of fiber per serving.  Use beans in place of meat in soups, salads, or pasta.  Eat high-fiber snacks, such as berries, raw vegetables, nuts, or popcorn. This information is not intended to replace advice given to you by your health care provider. Make sure you discuss any questions you have with your health care provider. Document Released: 08/10/2005 Document Revised: 01/16/2016 Document Reviewed: 01/23/2014 Elsevier Interactive Patient Education  [employer].

## 2017-11-08 NOTE — Assessment & Plan Note (Signed)
Labs Declined statins

## 2017-11-08 NOTE — Progress Notes (Signed)
Subjective:   Joann Fields is a 67 y.o. female who presents for Medicare Annual (Subsequent) preventive examination.  Review of Systems:  No ROS.  Medicare Wellness Visit. Additional risk factors are reflected in the social history.    Sleep patterns: feels rested on waking, gets up 2-3 times nightly to void and sleeps 7 hours nightly.   Home Safety/Smoke Alarms: Feels safe in home. Smoke alarms in place.  Living environment; residence and Firearm Safety: 1-story house/ trailer, no firearms.Lives alone, no needs for DME, good support system Seat Belt Safety/Bike Helmet: Wears seat belt.    Objective:     Vitals: BP 116/72 (BP Location: Left Arm, Patient Position: Sitting, Cuff Size: Normal)   Pulse 65   Temp 98.4 F (36.9 C) (Oral)   Ht 5\' 5"  (1.651 m)   Wt 157 lb (71.2 kg)   SpO2 98%   BMI 26.13 kg/m   Body mass index is 26.13 kg/m.  Advanced Directives 12/09/2016 11/25/2016 10/14/2016  Does Patient Have a Medical Advance Directive? No No No  Would patient like information on creating a medical advance directive? - - No - Patient declined    Tobacco Social History   Tobacco Use  Smoking Status Never Smoker  Smokeless Tobacco Never Used  Tobacco Comment   only in College     Counseling given: Not Answered Comment: only in [employer]  Past Medical History:  Diagnosis Date  . Cataract   . GERD (gastroesophageal reflux disease)   . Hyperlipidemia   . Hypertension   . Osteopenia    Past Surgical History:  Procedure Laterality Date  . COLONOSCOPY    . DG THUMB LEFT HAND     cut her hand while prepping chicken for supper  . SVD x 2    . WISDOM TOOTH EXTRACTION     Family History  Problem Relation Age of Onset  . Lung cancer Mother 22  . Heart disease Father 28       MI  . Alzheimer's disease Brother 48  . Early death Maternal Aunt   . Hearing loss Maternal Aunt   . Lymphoma Sister   . Colon cancer Neg Hx   . Colon polyps Neg Hx   . Esophageal cancer  Neg Hx    Social History   Socioeconomic History  . Marital status: Single    Spouse name: None  . Number of children: 2  . Years of education: None  . Highest education level: None  Social Needs  . Financial resource strain: None  . Food insecurity - worry: None  . Food insecurity - inability: None  . Transportation needs - medical: None  . Transportation needs - non-medical: None  Occupational History  . Occupation: Retired  Tobacco Use  . Smoking status: Never Smoker  . Smokeless tobacco: Never Used  . Tobacco comment: only in College  Substance and Sexual Activity  . Alcohol use: Yes    Alcohol/week: 0.0 oz    Comment: Rarely; once yearly  . Drug use: No  . Sexual activity: None  Other Topics Concern  . None  Social History Narrative  . None    Outpatient Encounter Medications as of 11/08/2017  Medication Sig  . CALCIUM-VITAMIN D PO Take 1 capsule by mouth daily. Takes 600 mg  . Cyanocobalamin (VITAMIN B-12) 1000 MCG SUBL PLACE 1 TABLET (1,000 MCG TOTAL) UNDER THE TONGUE DAILY.  Marland Kitchen diltiazem (CARDIZEM CD) 180 MG 24 hr capsule Take 1 capsule (180 mg  total) by mouth daily.  . fluticasone (FLONASE) 50 MCG/ACT nasal spray Place 2 sprays into both nostrils daily.  . NONFORMULARY OR COMPOUNDED ITEM [facility]:  Onychomycosis Nail Lacquer - Fluconazole 2%, Terbinafine 1%, DMSO, apply to affected area daily.  . ranitidine (ZANTAC) 150 MG tablet TAKE 1 TABLET (150 MG TOTAL) BY MOUTH 2 (TWO) TIMES DAILY.  . [DISCONTINUED] diltiazem (CARDIZEM CD) 180 MG 24 hr capsule TAKE 1 CAPSULE (180 MG TOTAL) BY MOUTH DAILY.  Marland Kitchen azithromycin (ZITHROMAX) 250 MG tablet 2 tabs po qd x 1 day; 1 tablet per day x 4 days;  Marland Kitchen Zoster Vaccine Adjuvanted [facility]) injection Inject 0.5 mLs into the muscle once for 1 dose. Repeat in  6 months once.  . [DISCONTINUED] azithromycin (ZITHROMAX) 250 MG tablet 2 tabs po qd x 1 day; 1 tablet per day x 4 days; (Patient not taking: Reported on 11/08/2017)    Facility-Administered Encounter Medications as of 11/08/2017  Medication  . 0.9 %  sodium chloride infusion    Activities of Daily Living No flowsheet data found.  Patient Care Team: Plotnikov, Evie Lacks, MD as PCP - Sondra Barges, MD as Attending Physician (Obstetrics and Gynecology) Milus Banister, MD as Attending Physician (Gastroenterology)    Assessment:   This is a routine wellness examination for Joann Fields. Physical assessment deferred to PCP.   Exercise Activities and Dietary recommendations   Diet (meal preparation, eat out, water intake, caffeinated beverages, dairy products, fruits and vegetables): in general, a "healthy" diet  , well balanced   Reviewed heart healthy diet. encouraged patient to increase daily water intake.  Goals    None      Fall Risk Fall Risk  11/08/2017 10/14/2016 11/14/2014  Falls in the past year? No No Yes  Number falls in past yr: - - 1  Injury with Fall? - - No    Depression Screen PHQ 2/9 Scores 11/08/2017 10/14/2016 11/14/2014  PHQ - 2 Score 0 0 0     Cognitive Function       Ad8 score reviewed for issues:  Issues making decisions: no  Less interest in hobbies / activities: no  Repeats questions, stories (family complaining): no  Trouble using ordinary gadgets (microwave, computer, phone):no  Forgets the month or year: no  Mismanaging finances: no  Remembering appts: no  Daily problems with thinking and/or memory: no Ad8 score is= 0    Immunization History  Administered Date(s) Administered  . Influenza, High Dose Seasonal PF 10/14/2016  . Influenza,inj,Quad PF,6+ Mos 09/12/2013, 09/24/2014, 09/27/2015  . Pneumococcal Conjugate-13 10/14/2016  . Td 05/14/2010  . Zoster 08/08/2012   Screening Tests Health Maintenance  Topic Date Due  . INFLUENZA VACCINE  03/24/2017  . PNA vac Low Risk Adult (2 of 2 - PPSV23) 10/14/2017  . MAMMOGRAM  09/06/2018  . COLONOSCOPY  12/10/2019  . TETANUS/TDAP   05/14/2020  . DEXA SCAN  Completed  . Hepatitis C Screening  Completed      Plan:    Continue doing brain stimulating activities (puzzles, reading, adult coloring books, staying active) to keep memory sharp.   Continue to eat heart healthy diet (full of fruits, vegetables, whole grains, lean protein, water--limit salt, fat, and sugar intake) and increase physical activity as tolerated.  I have personally reviewed and noted the following in the patient's chart:   . Medical and social history . Use of alcohol, tobacco or illicit drugs  . Current medications and supplements . Functional ability and status .  Nutritional status . Physical activity . Advanced directives . List of other physicians . Vitals . Screenings to include cognitive, depression, and falls . Referrals and appointments  In addition, I have reviewed and discussed with patient certain preventive protocols, quality metrics, and best practice recommendations. A written personalized care plan for preventive services as well as general preventive health recommendations were provided to patient.     Michiel Cowboy, RN  11/08/2017   Medical screening examination/treatment/procedure(s) were performed by non-physician practitioner and as supervising physician I was immediately available for consultation/collaboration. I agree with above. Lew Dawes, MD

## 2017-11-08 NOTE — Assessment & Plan Note (Addendum)
Here for medicare wellness/physical  Diet: heart healthy  Physical activity: not sedentary - very active Depression/mood screen: negative  Hearing: intact to whispered voice  Visual acuity: grossly normal, performs annual eye exam  ADLs: capable  Fall risk: none  Home safety: good  Cognitive evaluation: intact to orientation, naming, recall and repetition  EOL planning: adv directives, full code/ I agree  I have personally reviewed and have noted  1. The patient's medical, surgical and social history  2. Their use of alcohol, tobacco or illicit drugs  3. Their current medications and supplements  4. The patient's functional ability including ADL's, fall risks, home safety risks and hearing or visual impairment.  5. Diet and physical activities  6. Evidence for depression or mood disorders 7. The roster of all physicians providing medical care to patient - is listed in the Snapshot section of the chart and reviewed today.    Today patient counseled on age appropriate routine health concerns for screening and prevention, each reviewed and up to date or declined. Immunizations reviewed and up to date or declined. Labs ordered and reviewed. Risk factors for depression reviewed and negative. Hearing function and visual acuity are intact. ADLs screened and addressed as needed. Functional ability and level of safety reviewed and appropriate. Education, counseling and referrals performed based on assessed risks today. Patient provided with a copy of personalized plan for preventive services.   Colon - 2018 Dr Ronita Hipps: PAP, mammo, BDS  Vaccines

## 2017-12-22 ENCOUNTER — Ambulatory Visit: Payer: Medicare Other | Admitting: Internal Medicine

## 2017-12-22 ENCOUNTER — Encounter: Payer: Self-pay | Admitting: Internal Medicine

## 2017-12-22 DIAGNOSIS — M545 Low back pain: Secondary | ICD-10-CM

## 2017-12-22 DIAGNOSIS — F430 Acute stress reaction: Secondary | ICD-10-CM | POA: Diagnosis not present

## 2017-12-22 DIAGNOSIS — G8929 Other chronic pain: Secondary | ICD-10-CM | POA: Diagnosis not present

## 2017-12-22 DIAGNOSIS — M542 Cervicalgia: Secondary | ICD-10-CM | POA: Diagnosis not present

## 2017-12-22 DIAGNOSIS — M25562 Pain in left knee: Secondary | ICD-10-CM | POA: Diagnosis not present

## 2017-12-22 DIAGNOSIS — E538 Deficiency of other specified B group vitamins: Secondary | ICD-10-CM

## 2017-12-22 DIAGNOSIS — S8992XA Unspecified injury of left lower leg, initial encounter: Secondary | ICD-10-CM | POA: Diagnosis not present

## 2017-12-22 DIAGNOSIS — M25561 Pain in right knee: Secondary | ICD-10-CM

## 2017-12-22 DIAGNOSIS — S199XXA Unspecified injury of neck, initial encounter: Secondary | ICD-10-CM

## 2017-12-22 DIAGNOSIS — S8991XA Unspecified injury of right lower leg, initial encounter: Secondary | ICD-10-CM

## 2017-12-22 NOTE — Assessment & Plan Note (Signed)
See other A/P
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
## 2017-12-22 NOTE — Assessment & Plan Note (Signed)
On B12 
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
## 2017-12-22 NOTE — Patient Instructions (Addendum)
Valerian root for anxiety Ice for pain Tylenol as needed Stretch
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
## 2017-12-23 ENCOUNTER — Encounter: Payer: Self-pay | Admitting: Family Medicine

## 2017-12-23 ENCOUNTER — Ambulatory Visit: Payer: Medicare Other | Admitting: Family Medicine

## 2017-12-23 VITALS — BP 134/78 | HR 71 | Temp 97.6°F | Ht 65.0 in | Wt 159.0 lb

## 2017-12-23 DIAGNOSIS — M79605 Pain in left leg: Secondary | ICD-10-CM | POA: Insufficient documentation

## 2017-12-23 DIAGNOSIS — M79604 Pain in right leg: Secondary | ICD-10-CM | POA: Diagnosis not present

## 2017-12-23 NOTE — Assessment & Plan Note (Signed)
No deficits on exam. Appears to be muscular in nature.  - counseled on supportive care  - can continue to exercises  - if no improvement would consider xrays, muscle relaxer or PT
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
## 2017-12-23 NOTE — Patient Instructions (Signed)
Nice to meet you Please try advil or aleve for pain. You can also try tylenol  Please continue working out  Please follow up with me in 3-4 weeks if your symptoms haven't improved.

## 2018-09-01 ENCOUNTER — Other Ambulatory Visit: Payer: Self-pay | Admitting: Internal Medicine

## 2018-09-09 ENCOUNTER — Encounter: Payer: Self-pay | Admitting: Family Medicine

## 2018-09-09 ENCOUNTER — Ambulatory Visit: Payer: Self-pay

## 2018-09-09 ENCOUNTER — Ambulatory Visit: Payer: Medicare Other | Admitting: Family Medicine

## 2018-09-09 VITALS — BP 130/86 | HR 77 | Resp 16 | Ht 65.0 in | Wt 155.0 lb

## 2018-09-09 DIAGNOSIS — M25562 Pain in left knee: Secondary | ICD-10-CM

## 2018-09-09 NOTE — Progress Notes (Signed)
Joann Fields - 68 y.o. female MRN [id number]  Date of birth: 06-02-1951  SUBJECTIVE:  Including CC & ROS.  No chief complaint on file.   Joann Fields is a 68 y.o. female that is presenting with left knee pain.  The pain is anterior in nature.  Has been worse over the past few weeks.  Denies any mechanical symptoms.  Has been working out more and notices it when she is doing some squats.  Does not have any pain at rest.  Has not taken any medications for the pain.  Pain is anterior and sharp in nature.  No prior history of surgeries..   Review of Systems  Constitutional: Negative for fever.  HENT: Negative for congestion.   Respiratory: Negative for cough.   Cardiovascular: Negative for chest pain.  Gastrointestinal: Negative for abdominal pain.  Musculoskeletal: Negative for gait problem.  Skin: Negative for color change.  Neurological: Negative for weakness.  Hematological: Negative for adenopathy.  Psychiatric/Behavioral: Negative for agitation.    HISTORY: Past Medical, Surgical, Social, and Family History Reviewed & Updated per EMR.   Pertinent Historical Findings include:  Past Medical History:  Diagnosis Date  . Cataract   . GERD (gastroesophageal reflux disease)   . Hyperlipidemia   . Hypertension   . Osteopenia     Past Surgical History:  Procedure Laterality Date  . COLONOSCOPY    . DG THUMB LEFT HAND     cut her hand while prepping chicken for supper  . SVD x 2    . WISDOM TOOTH EXTRACTION      Allergies  Allergen Reactions  . Amoxicillin Rash    Family History  Problem Relation Age of Onset  . Lung cancer Mother 23  . Heart disease Father 58       MI  . Alzheimer's disease Brother 21  . Early death Maternal Aunt   . Hearing loss Maternal Aunt   . Lymphoma Sister   . Colon cancer Neg Hx   . Colon polyps Neg Hx   . Esophageal cancer Neg Hx      Social History   Socioeconomic History  . Marital status: Single    Spouse name: Not on  file  . Number of children: 2  . Years of education: Not on file  . Highest education level: Not on file  Occupational History  . Occupation: Retired  Scientific laboratory technician  . Financial resource strain: Not on file  . Food insecurity:    Worry: Not on file    Inability: Not on file  . Transportation needs:    Medical: Not on file    Non-medical: Not on file  Tobacco Use  . Smoking status: Never Smoker  . Smokeless tobacco: Never Used  . Tobacco comment: only in College  Substance and Sexual Activity  . Alcohol use: Yes    Alcohol/week: 0.0 standard drinks    Comment: Rarely; once yearly  . Drug use: No  . Sexual activity: Not on file  Lifestyle  . Physical activity:    Days per week: Not on file    Minutes per session: Not on file  . Stress: Not on file  Relationships  . Social connections:    Talks on phone: Not on file    Gets together: Not on file    Attends religious service: Not on file    Active member of club or organization: Not on file    Attends meetings of clubs or organizations: Not  on file    Relationship status: Not on file  . Intimate partner violence:    Fear of current or ex partner: Not on file    Emotionally abused: Not on file    Physically abused: Not on file    Forced sexual activity: Not on file  Other Topics Concern  . Not on file  Social History Narrative  . Not on file     PHYSICAL EXAM:  VS: BP 130/86   Pulse 77   Resp 16   Ht 5\' 5"  (1.651 m)   Wt 155 lb (70.3 kg)   SpO2 97%   BMI 25.79 kg/m  Physical Exam Gen: NAD, alert, cooperative with exam, well-appearing ENT: normal lips, normal nasal mucosa,  Eye: normal EOM, normal conjunctiva and lids CV:  no edema, +2 pedal pulses   Resp: no accessory muscle use, non-labored,  Skin: no rashes, no areas of induration  Neuro: normal tone, normal sensation to touch Psych:  normal insight, alert and oriented MSK:  Left knee: No obvious effusion. No tenderness to palpation over the medial  lateral joint line. Normal range of motion. Normal strength resistance. No instability with valgus or varus stress testing. Negative McMurray's test. Neurovascular intact.  Limited ultrasound: Left knee:  No effusion in the suprapatellar pouch. Normal-appearing quadricep and patellar tendon. Mild medial joint space narrowing with mild degenerative changes of the medial meniscus. Normal-appearing lateral meniscus and joint line  Summary: Mild degenerative changes in the medial joint line.  Ultrasound and interpretation by Clearance Coots, MD      ASSESSMENT & PLAN:   Acute pain of left knee Pain is likely related to patellofemoral syndrome.  Does have mild degenerative changes but no effusion. -Counseled on home exercise therapy and supportive care. -If no improvement consider imaging or physical therapy.

## 2018-09-09 NOTE — Patient Instructions (Signed)
Good to see you  Please focus on the exercises  Please try ice on the knees  Please take tylenol  Please see me back in 3-4 weeks if no better

## 2018-09-09 NOTE — Assessment & Plan Note (Signed)
Pain is likely related to patellofemoral syndrome.  Does have mild degenerative changes but no effusion. -Counseled on home exercise therapy and supportive care. -If no improvement consider imaging or physical therapy.

## 2018-09-11 ENCOUNTER — Other Ambulatory Visit: Payer: Self-pay | Admitting: Internal Medicine

## 2018-11-02 ENCOUNTER — Other Ambulatory Visit: Payer: Self-pay | Admitting: Obstetrics and Gynecology

## 2018-11-02 DIAGNOSIS — M810 Age-related osteoporosis without current pathological fracture: Secondary | ICD-10-CM

## 2018-11-02 DIAGNOSIS — E559 Vitamin D deficiency, unspecified: Secondary | ICD-10-CM

## 2018-11-11 ENCOUNTER — Encounter: Payer: Medicare Other | Admitting: Internal Medicine

## 2018-11-16 ENCOUNTER — Telehealth: Payer: Self-pay | Admitting: *Deleted

## 2018-11-16 NOTE — Telephone Encounter (Signed)
  I called pt RE: 11/17/18 CPE scheduled with PCP. She wants to R/S due to COVID-19 pandemic. CPE R/S for 01/24/19 @ 8:10am.

## 2018-11-17 ENCOUNTER — Encounter: Payer: Medicare Other | Admitting: Internal Medicine

## 2018-11-23 ENCOUNTER — Other Ambulatory Visit: Payer: Self-pay | Admitting: Internal Medicine

## 2019-01-03 ENCOUNTER — Other Ambulatory Visit: Payer: Medicare Other

## 2019-01-04 NOTE — Progress Notes (Signed)
Subjective:   Joann Fields is a 68 y.o. female who presents for Medicare Annual (Subsequent) preventive examination. I connected with patient by a telephone and verified that I am speaking with the correct person using two identifiers. Patient stated full name and DOB. Patient gave permission to continue with telephonic visit. Patient's location was at home and Nurse's location was at [place] office.   Review of Systems:  No ROS.  Medicare Wellness Virtual Visit.  Visual/audio telehealth visit, UTA vital signs.   See social history for additional risk factors. Cardiac Risk Factors include: advanced age (>37men, >66 women);dyslipidemia;hypertension Sleep patterns: feels rested on waking, gets up times nightly to void and sleeps 6-8 hours nightly.    Home Safety/Smoke Alarms: Feels safe in home. Smoke alarms in place.  Living environment; residence and Firearm Safety: 1-story house/ trailer. Lives alone, no needs for DME, good support system Seat Belt Safety/Bike Helmet: Wears seat belt.     Objective:     Vitals: There were no vitals taken for this visit.  There is no height or weight on file to calculate BMI.  Advanced Directives 01/05/2019 11/08/2017 12/09/2016 11/25/2016 10/14/2016  Does Patient Have a Medical Advance Directive? No No No No No  Does patient want to make changes to medical advance directive? Yes (ED - Information included in AVS) - - - -  Would patient like information on creating a medical advance directive? - Yes (ED - Information included in AVS) - - No - Patient declined    Tobacco Social History   Tobacco Use  Smoking Status Never Smoker  Smokeless Tobacco Never Used  Tobacco Comment   only in College     Counseling given: Not Answered Comment: only in [employer]  Past Medical History:  Diagnosis Date  . Cataract   . GERD (gastroesophageal reflux disease)   . Hyperlipidemia   . Hypertension   . Osteopenia    Past Surgical History:  Procedure  Laterality Date  . COLONOSCOPY    . DG THUMB LEFT HAND     cut her hand while prepping chicken for supper  . SVD x 2    . WISDOM TOOTH EXTRACTION     Family History  Problem Relation Age of Onset  . Lung cancer Mother 106  . Heart disease Father 87       MI  . Alzheimer's disease Brother 16  . Early death Maternal Aunt   . Hearing loss Maternal Aunt   . Lymphoma Sister   . Colon cancer Neg Hx   . Colon polyps Neg Hx   . Esophageal cancer Neg Hx    Social History   Socioeconomic History  . Marital status: Single    Spouse name: Not on file  . Number of children: 2  . Years of education: Not on file  . Highest education level: Not on file  Occupational History  . Occupation: Retired  Scientific laboratory technician  . Financial resource strain: Not hard at all  . Food insecurity:    Worry: Never true    Inability: Never true  . Transportation needs:    Medical: No    Non-medical: No  Tobacco Use  . Smoking status: Never Smoker  . Smokeless tobacco: Never Used  . Tobacco comment: only in College  Substance and Sexual Activity  . Alcohol use: Yes    Alcohol/week: 0.0 standard drinks    Comment: Rarely; once yearly  . Drug use: No  . Sexual activity: Not Currently  Lifestyle  . Physical activity:    Days per week: 4 days    Minutes per session: 30 min  . Stress: Not at all  Relationships  . Social connections:    Talks on phone: More than three times a week    Gets together: More than three times a week    Attends religious service: 1 to 4 times per year    Active member of club or organization: Yes    Attends meetings of clubs or organizations: More than 4 times per year    Relationship status: Not on file  Other Topics Concern  . Not on file  Social History Narrative  . Not on file    Outpatient Encounter Medications as of 01/05/2019  Medication Sig  . CALCIUM-VITAMIN D PO Take 1 capsule by mouth daily. Takes 600 mg  . Cyanocobalamin (VITAMIN B-12) 1000 MCG SUBL PLACE 1  TABLET UNDER THE TONGUE DAILY.  Marland Kitchen diltiazem (CARDIZEM CD) 180 MG 24 hr capsule TAKE 1 CAPSULE BY MOUTH EVERY DAY  . fluticasone (FLONASE) 50 MCG/ACT nasal spray Place 2 sprays into both nostrils daily.  . ranitidine (ZANTAC) 150 MG tablet TAKE 1 TABLET (150 MG TOTAL) BY MOUTH 2 (TWO) TIMES DAILY.  . [DISCONTINUED] NONFORMULARY OR COMPOUNDED ITEM [facility]:  Onychomycosis Nail Lacquer - Fluconazole 2%, Terbinafine 1%, DMSO, apply to affected area daily. (Patient not taking: Reported on 01/05/2019)   Facility-Administered Encounter Medications as of 01/05/2019  Medication  . 0.9 %  sodium chloride infusion    Activities of Daily Living In your present state of health, do you have any difficulty performing the following activities: 01/05/2019  Hearing? N  Vision? N  Difficulty concentrating or making decisions? N  Walking or climbing stairs? N  Dressing or bathing? N  Doing errands, shopping? N  Preparing Food and eating ? N  Using the Toilet? N  In the past six months, have you accidently leaked urine? N  Do you have problems with loss of bowel control? N  Managing your Medications? N  Managing your Finances? N  Housekeeping or managing your Housekeeping? N  Some recent data might be hidden    Patient Care Team: Plotnikov, Evie Lacks, MD as PCP - General Brien Few, MD as Attending Physician (Obstetrics and Gynecology) Milus Banister, MD as Attending Physician (Gastroenterology)    Assessment:   This is a routine wellness examination for Joann Fields. Physical assessment deferred to PCP.  Exercise Activities and Dietary recommendations Current Exercise Habits: Home exercise routine, Type of exercise: walking;calisthenics, Time (Minutes): 45, Frequency (Times/Week): 3, Weekly Exercise (Minutes/Week): 135, Intensity: Mild, Exercise limited by: None identified  Diet (meal preparation, eat out, water intake, caffeinated beverages, dairy products, fruits and vegetables): in  general, a "healthy" diet  , well balanced   Reviewed heart healthy  Diet. Encouraged patient to increase daily water and healthy fluid intake.   Goals    . Patient Stated     Increase the amount of water I drink daily. I will start to set out 3 bottles of water daily and drink them.       Fall Risk Fall Risk  01/05/2019 11/08/2017 10/14/2016 11/14/2014  Falls in the past year? 0 No No Yes  Number falls in past yr: 0 - - 1  Injury with Fall? - - - No    Depression Screen PHQ 2/9 Scores 01/05/2019 11/08/2017 10/14/2016 11/14/2014  PHQ - 2 Score 0 0 0 0     Cognitive  Function       Ad8 score reviewed for issues:  Issues making decisions: no  Less interest in hobbies / activities: no  Repeats questions, stories (family complaining): no  Trouble using ordinary gadgets (microwave, computer, phone):no  Forgets the month or year: no  Mismanaging finances: no  Remembering appts: no  Daily problems with thinking and/or memory: no Ad8 score is= 0  Immunization History  Administered Date(s) Administered  . Influenza, High Dose Seasonal PF 10/14/2016, 11/08/2017, 06/29/2018  . Influenza,inj,Quad PF,6+ Mos 09/12/2013, 09/24/2014, 09/27/2015  . Pneumococcal Conjugate-13 10/14/2016  . Pneumococcal Polysaccharide-23 11/08/2017  . Td 05/14/2010  . Zoster 08/08/2012   Screening Tests Health Maintenance  Topic Date Due  . INFLUENZA VACCINE  03/25/2019  . MAMMOGRAM  10/28/2019  . COLONOSCOPY  12/10/2019  . TETANUS/TDAP  05/14/2020  . DEXA SCAN  Completed  . Hepatitis C Screening  Completed  . PNA vac Low Risk Adult  Completed      Plan:    Reviewed health maintenance screenings with patient today and relevant education, vaccines, and/or referrals were provided.   Continue to eat heart healthy diet (full of fruits, vegetables, whole grains, lean protein, water--limit salt, fat, and sugar intake) and increase physical activity as tolerated.  Continue doing brain  stimulating activities (puzzles, reading, adult coloring books, staying active) to keep memory sharp.   I have personally reviewed and noted the following in the patient's chart:   . Medical and social history . Use of alcohol, tobacco or illicit drugs  . Current medications and supplements . Functional ability and status . Nutritional status . Physical activity . Advanced directives . List of other physicians . Screenings to include cognitive, depression, and falls . Referrals and appointments  In addition, I have reviewed and discussed with patient certain preventive protocols, quality metrics, and best practice recommendations. A written personalized care plan for preventive services as well as general preventive health recommendations were provided to patient.     Michiel Cowboy, RN  01/05/2019

## 2019-01-05 ENCOUNTER — Ambulatory Visit (INDEPENDENT_AMBULATORY_CARE_PROVIDER_SITE_OTHER): Payer: Medicare Other | Admitting: *Deleted

## 2019-01-05 DIAGNOSIS — Z0000 Encounter for general adult medical examination without abnormal findings: Secondary | ICD-10-CM | POA: Diagnosis not present

## 2019-01-05 NOTE — Patient Instructions (Addendum)
Continue doing brain stimulating activities (puzzles, reading, adult coloring books, staying active) to keep memory sharp.   Continue to eat heart healthy diet (full of fruits, vegetables, whole grains, lean protein, water--limit salt, fat, and sugar intake) and increase physical activity as tolerated.   Joann Fields , Thank you for taking time to come for your Medicare Wellness Visit. I appreciate your ongoing commitment to your health goals. Please review the following plan we discussed and let me know if I can assist you in the future.   These are the goals we discussed: Goals    . Patient Stated     Increase the amount of water I drink daily. I will start to set out 3 bottles of water daily and drink them.       This is a list of the screening recommended for you and due dates:  Health Maintenance  Topic Date Due  . Flu Shot  03/25/2019  . Mammogram  10/28/2019  . Colon Cancer Screening  12/10/2019  . Tetanus Vaccine  05/14/2020  . DEXA scan (bone density measurement)  Completed  .  Hepatitis C: One time screening is recommended by Center for Disease Control  (CDC) for  adults born from [birth year] through 1965.   Completed  . Pneumonia vaccines  Completed    Preventive Care 24 Years and Older, Female Preventive care refers to lifestyle choices and visits with your health care provider that can promote health and wellness. What does preventive care include?  A yearly physical exam. This is also called an annual well check.  Dental exams once or twice a year.  Routine eye exams. Ask your health care provider how often you should have your eyes checked.  Personal lifestyle choices, including: ? Daily care of your teeth and gums. ? Regular physical activity. ? Eating a healthy diet. ? Avoiding tobacco and drug use. ? Limiting alcohol use. ? Practicing safe sex. ? Taking low-dose aspirin every day. ? Taking vitamin and mineral supplements as recommended by your health care  provider. What happens during an annual well check? The services and screenings done by your health care provider during your annual well check will depend on your age, overall health, lifestyle risk factors, and family history of disease. Counseling Your health care provider may ask you questions about your:  Alcohol use.  Tobacco use.  Drug use.  Emotional well-being.  Home and relationship well-being.  Sexual activity.  Eating habits.  History of falls.  Memory and ability to understand (cognition).  Work and work Statistician.  Reproductive health.  Screening You may have the following tests or measurements:  Height, weight, and BMI.  Blood pressure.  Lipid and cholesterol levels. These may be checked every 5 years, or more frequently if you are over 17 years old.  Skin check.  Lung cancer screening. You may have this screening every year starting at age 40 if you have a 30-pack-year history of smoking and currently smoke or have quit within the past 15 years.  Colorectal cancer screening. All adults should have this screening starting at age 3 and continuing until age 54. You will have tests every 1-10 years, depending on your results and the type of screening test. People at increased risk should start screening at an earlier age. Screening tests may include: ? Guaiac-based fecal occult blood testing. ? Fecal immunochemical test (FIT). ? Stool DNA test. ? Virtual colonoscopy. ? Sigmoidoscopy. During this test, a flexible tube with a tiny camera (sigmoidoscope)  is used to examine your rectum and lower colon. The sigmoidoscope is inserted through your anus into your rectum and lower colon. ? Colonoscopy. During this test, a long, thin, flexible tube with a tiny camera (colonoscope) is used to examine your entire colon and rectum.  Hepatitis C blood test.  Hepatitis B blood test.  Sexually transmitted disease (STD) testing.  Diabetes screening. This is done  by checking your blood sugar (glucose) after you have not eaten for a while (fasting). You may have this done every 1-3 years.  Bone density scan. This is done to screen for osteoporosis. You may have this done starting at age 83.  Mammogram. This may be done every 1-2 years. Talk to your health care provider about how often you should have regular mammograms. Talk with your health care provider about your test results, treatment options, and if necessary, the need for more tests. Vaccines Your health care provider may recommend certain vaccines, such as:  Influenza vaccine. This is recommended every year.  Tetanus, diphtheria, and acellular pertussis (Tdap, Td) vaccine. You may need a Td booster every 10 years.  Varicella vaccine. You may need this if you have not been vaccinated.  Zoster vaccine. You may need this after age 56.  Measles, mumps, and rubella (MMR) vaccine. You may need at least one dose of MMR if you were born in 1957 or later. You may also need a second dose.  Pneumococcal 13-valent conjugate (PCV13) vaccine. One dose is recommended after age 13.  Pneumococcal polysaccharide (PPSV23) vaccine. One dose is recommended after age 42.  Meningococcal vaccine. You may need this if you have certain conditions.  Hepatitis A vaccine. You may need this if you have certain conditions or if you travel or work in places where you may be exposed to hepatitis A.  Hepatitis B vaccine. You may need this if you have certain conditions or if you travel or work in places where you may be exposed to hepatitis B.  Haemophilus influenzae type b (Hib) vaccine. You may need this if you have certain conditions. Talk to your health care provider about which screenings and vaccines you need and how often you need them. This information is not intended to replace advice given to you by your health care provider. Make sure you discuss any questions you have with your health care provider. Document  Released: 09/06/2015 Document Revised: 09/30/2017 Document Reviewed: 06/11/2015 Elsevier Interactive Patient Education  2019 [place] and Cholesterol Restricted Eating Plan Getting too much fat and cholesterol in your diet may cause health problems. Choosing the right foods helps keep your fat and cholesterol at normal levels. This can keep you from getting certain diseases. Your doctor may recommend an eating plan that includes:  Total fat: ______% or less of total calories a day.  Saturated fat: ______% or less of total calories a day.  Cholesterol: less than _________mg a day.  Fiber: ______g a day. What are tips for following this plan? Meal planning  At meals, divide your plate into four equal parts: ? Fill one-half of your plate with vegetables and green salads. ? Fill one-fourth of your plate with whole grains. ? Fill one-fourth of your plate with low-fat (lean) protein foods.  Eat fish that is high in omega-3 fats at least two times a week. This includes mackerel, tuna, sardines, and salmon.  Eat foods that are high in fiber, such as whole grains, beans, apples, broccoli, carrots, peas, and barley. General tips  Work with your doctor to lose weight if you need to.  Avoid: ? Foods with added sugar. ? Fried foods. ? Foods with partially hydrogenated oils.  Limit alcohol intake to no more than 1 drink a day for nonpregnant women and 2 drinks a day for men. One drink equals 12 oz of beer, 5 oz of , or 1 oz of hard liquor. Reading food labels  Check food labels for: ? Trans fats. ? Partially hydrogenated oils. ? Saturated fat (g) in each serving. ? Cholesterol (mg) in each serving. ? Fiber (g) in each serving.  Choose foods with healthy fats, such as: ? Monounsaturated fats. ? Polyunsaturated fats. ? Omega-3 fats.  Choose grain products that have whole grains. Look for the word "whole" as the first word in the ingredient list. Cooking  Cook  foods using low-fat methods. These include baking, boiling, grilling, and broiling.  Eat more home-cooked foods. Eat at restaurants and buffets less often.  Avoid cooking using saturated fats, such as butter, cream, palm oil, palm kernel oil, and coconut oil. Recommended foods  Fruits  All fresh, canned (in natural juice), or frozen fruits. Vegetables  Fresh or frozen vegetables (raw, steamed, roasted, or grilled). Green salads. Grains  Whole grains, such as whole wheat or whole grain breads, crackers, cereals, and pasta. Unsweetened oatmeal, bulgur, barley, quinoa, or brown rice. Corn or whole wheat flour tortillas. Meats and other protein foods  Ground beef (85% or leaner), grass-fed beef, or beef trimmed of fat. Skinless chicken or [place]. Ground chicken or [place]. Pork trimmed of fat. All fish and seafood. Egg whites. Dried beans, peas, or lentils. Unsalted nuts or seeds. Unsalted canned beans. Nut butters without added sugar or oil. Dairy  Low-fat or nonfat dairy products, such as skim or 1% milk, 2% or reduced-fat cheeses, low-fat and fat-free ricotta or cottage cheese, or plain low-fat and nonfat yogurt. Fats and oils  Tub margarine without trans fats. Light or reduced-fat mayonnaise and salad dressings. Avocado. Olive, canola, sesame, or safflower oils. The items listed above may not be a complete list of foods and beverages you can eat. Contact a dietitian for more information. Foods to avoid Fruits  Canned fruit in heavy syrup. Fruit in cream or butter sauce. Fried fruit. Vegetables  Vegetables cooked in cheese, cream, or butter sauce. Fried vegetables. Grains  White bread. White pasta. White rice. Cornbread. Bagels, pastries, and croissants. Crackers and snack foods that contain trans fat and hydrogenated oils. Meats and other protein foods  Fatty cuts of meat. Ribs, chicken wings, bacon, sausage, bologna, salami, chitterlings, fatback, hot dogs, bratwurst, and  packaged lunch meats. Liver and organ meats. Whole eggs and egg yolks. Chicken and [place] with skin. Fried meat. Dairy  Whole or 2% milk, cream, half-and-half, and cream cheese. Whole milk cheeses. Whole-fat or sweetened yogurt. Full-fat cheeses. Nondairy creamers and whipped toppings. Processed cheese, cheese spreads, and cheese curds. Beverages  Alcohol. Sugar-sweetened drinks such as sodas, lemonade, and fruit drinks. Fats and oils  Butter, stick margarine, lard, shortening, ghee, or bacon fat. Coconut, palm kernel, and palm oils. Sweets and desserts  Corn syrup, sugars, honey, and molasses. Candy. Jam and jelly. Syrup. Sweetened cereals. Cookies, pies, cakes, donuts, muffins, and ice cream. The items listed above may not be a complete list of foods and beverages you should avoid. Contact a dietitian for more information. Summary  Choosing the right foods helps keep your fat and cholesterol at normal levels. This can keep you from getting  certain diseases.  At meals, fill one-half of your plate with vegetables and green salads.  Eat high-fiber foods, like whole grains, beans, apples, carrots, peas, and barley.  Limit added sugar, saturated fats, alcohol, and fried foods. This information is not intended to replace advice given to you by your health care provider. Make sure you discuss any questions you have with your health care provider. Document Released: 02/09/2012 Document Revised: 04/13/2018 Document Reviewed: 04/27/2017 Elsevier Interactive Patient Education  2019 [employer].

## 2019-01-05 NOTE — Progress Notes (Signed)
Medical screening examination/treatment/procedure(s) were performed by non-physician practitioner and as supervising physician I was immediately available for consultation/collaboration. I agree with above. Elizabeth A Crawford, MD 

## 2019-01-24 ENCOUNTER — Ambulatory Visit: Payer: Medicare Other | Admitting: Internal Medicine

## 2019-02-23 ENCOUNTER — Ambulatory Visit (INDEPENDENT_AMBULATORY_CARE_PROVIDER_SITE_OTHER): Payer: Medicare Other | Admitting: Internal Medicine

## 2019-02-23 ENCOUNTER — Encounter: Payer: Self-pay | Admitting: Internal Medicine

## 2019-02-23 ENCOUNTER — Other Ambulatory Visit: Payer: Self-pay

## 2019-02-23 DIAGNOSIS — E538 Deficiency of other specified B group vitamins: Secondary | ICD-10-CM | POA: Diagnosis not present

## 2019-02-23 DIAGNOSIS — I10 Essential (primary) hypertension: Secondary | ICD-10-CM

## 2019-02-23 DIAGNOSIS — E785 Hyperlipidemia, unspecified: Secondary | ICD-10-CM

## 2019-02-23 DIAGNOSIS — Z0000 Encounter for general adult medical examination without abnormal findings: Secondary | ICD-10-CM

## 2019-02-23 NOTE — Patient Instructions (Addendum)
If you have medicare related insurance (such as traditional Medicare, [employer], [employer], or similar), Please make an appointment at the scheduling desk with Sharee Pimple, the [employer], for your Wellness visit in this office, which is a benefit with your insurance.    [facility]   Cardiac CT calcium scoring test $150   Computed tomography, more commonly known as a CT or CAT scan, is a diagnostic medical imaging test. Like traditional x-rays, it produces multiple images or pictures of the inside of the body. The cross-sectional images generated during a CT scan can be reformatted in multiple planes. They can even generate three-dimensional images. These images can be viewed on a computer monitor, printed on film or by a 3D printer, or transferred to a CD or DVD. CT images of internal organs, bones, soft tissue and blood vessels provide greater detail than traditional x-rays, particularly of soft tissues and blood vessels. A cardiac CT scan for coronary calcium is a non-invasive way of obtaining information about the presence, location and extent of calcified plaque in the coronary arteries-the vessels that supply oxygen-containing blood to the heart muscle. Calcified plaque results when there is a build-up of fat and other substances under the inner layer of the artery. This material can calcify which signals the presence of atherosclerosis, a disease of the vessel wall, also called coronary artery disease (CAD). People with this disease have an increased risk for heart attacks. In addition, over time, progression of plaque build up (CAD) can narrow the arteries or even close off blood flow to the heart. The result may be chest pain, sometimes called "angina," or a heart attack. Because calcium is a marker of CAD, the amount of calcium detected on a cardiac CT scan is a helpful prognostic tool. The findings on cardiac CT are expressed as a calcium score. Another name for  this test is coronary artery calcium scoring.  What are some common uses of the procedure? The goal of cardiac CT scan for calcium scoring is to determine if CAD is present and to what extent, even if there are no symptoms. It is a screening study that may be recommended by a physician for patients with risk factors for CAD but no clinical symptoms. The major risk factors for CAD are: . high blood cholesterol levels  . family history of heart attacks  . diabetes  . high blood pressure  . cigarette smoking  . overweight or obese  . physical inactivity   A negative cardiac CT scan for calcium scoring shows no calcification within the coronary arteries. This suggests that CAD is absent or so minimal it cannot be seen by this technique. The chance of having a heart attack over the next two to five years is very low under these circumstances. A positive test means that CAD is present, regardless of whether or not the patient is experiencing any symptoms. The amount of calcification-expressed as the calcium score-may help to predict the likelihood of a myocardial infarction (heart attack) in the coming years and helps your medical doctor or cardiologist decide whether the patient may need to take preventive medicine or undertake other measures such as diet and exercise to lower the risk for heart attack. The extent of CAD is graded according to your calcium score:  Calcium Score  Presence of CAD  0 No evidence of CAD   1-10 Minimal evidence of CAD  11-100 Mild evidence of CAD  101-400 Moderate evidence of CAD  Over  400 Extensive evidence of CAD

## 2019-02-23 NOTE — Assessment & Plan Note (Signed)
CT coronary ca scoring test offered

## 2019-02-23 NOTE — Assessment & Plan Note (Addendum)
We discussed age appropriate health related issues, including available/recomended screening tests and vaccinations. We discussed a need for adhering to healthy diet and exercise. Labs were ordered to be later reviewed . All questions were answered. Shingrix discussed CT coronary ca scoring test offered 02/2019 Colon - 2018 Dr Ronita Hipps: PAP, mammo, BDS

## 2019-02-23 NOTE — Assessment & Plan Note (Signed)
CT coronary ca scoring test offered  [facility]

## 2019-02-23 NOTE — Assessment & Plan Note (Signed)
On B12 

## 2019-02-23 NOTE — Progress Notes (Signed)
Subjective:  Patient ID: Joann Fields, female    DOB: [birth year]  Age: 68 y.o. MRN: [id number]  CC: No chief complaint on file.   HPI ESMIRNA RAVAN presents for a well exam F/u HTN, dyslipidemia, B12 def  Outpatient Medications Prior to Visit  Medication Sig Dispense Refill  . CALCIUM-VITAMIN D PO Take 1 capsule by mouth daily. Takes 600 mg    . Cyanocobalamin (VITAMIN B-12) 1000 MCG SUBL PLACE 1 TABLET UNDER THE TONGUE DAILY. 100 tablet 3  . diltiazem (CARDIZEM CD) 180 MG 24 hr capsule TAKE 1 CAPSULE BY MOUTH EVERY DAY 90 capsule 3  . fluticasone (FLONASE) 50 MCG/ACT nasal spray Place 2 sprays into both nostrils daily. 16 g 6  . ranitidine (ZANTAC) 150 MG tablet TAKE 1 TABLET (150 MG TOTAL) BY MOUTH 2 (TWO) TIMES DAILY. 60 tablet 2   Facility-Administered Medications Prior to Visit  Medication Dose Route Frequency Provider Last Rate Last Dose  . 0.9 %  sodium chloride infusion  500 mL Intravenous Continuous Milus Banister, MD        ROS: Review of Systems  Constitutional: Negative for activity change, appetite change, chills, diaphoresis, fatigue, fever and unexpected weight change.  HENT: Negative for congestion, dental problem, ear pain, hearing loss, mouth sores, postnasal drip, sinus pressure, sneezing, sore throat and voice change.   Eyes: Negative for pain and visual disturbance.  Respiratory: Negative for cough, chest tightness, wheezing and stridor.   Cardiovascular: Negative for chest pain, palpitations and leg swelling.  Gastrointestinal: Negative for abdominal distention, abdominal pain, blood in stool, nausea, rectal pain and vomiting.  Genitourinary: Negative for decreased urine volume, difficulty urinating, dysuria, frequency, hematuria, menstrual problem, vaginal bleeding, vaginal discharge and vaginal pain.  Musculoskeletal: Negative for arthralgias, back pain, gait problem, joint swelling and neck pain.  Skin: Negative for color change, rash and wound.   Neurological: Negative for dizziness, tremors, syncope, speech difficulty, weakness and light-headedness.  Hematological: Negative for adenopathy.  Psychiatric/Behavioral: Negative for behavioral problems, confusion, decreased concentration, dysphoric mood, hallucinations, sleep disturbance and suicidal ideas. The patient is not nervous/anxious and is not hyperactive.     Objective:  BP 132/84 (BP Location: Left Arm, Patient Position: Sitting, Cuff Size: Normal)   Pulse 76   Temp 98.4 F (36.9 C) (Oral)   Ht 5\' 5"  (1.651 m)   Wt 162 lb (73.5 kg)   SpO2 97%   BMI 26.96 kg/m   BP Readings from Last 3 Encounters:  02/23/19 132/84  09/09/18 130/86  12/23/17 134/78    Wt Readings from Last 3 Encounters:  02/23/19 162 lb (73.5 kg)  09/09/18 155 lb (70.3 kg)  12/23/17 159 lb (72.1 kg)    Physical Exam Constitutional:      General: She is not in acute distress.    Appearance: She is well-developed.  HENT:     Head: Normocephalic.     Right Ear: External ear normal.     Left Ear: External ear normal.     Nose: Nose normal.  Eyes:     General:        Right eye: No discharge.        Left eye: No discharge.     Conjunctiva/sclera: Conjunctivae normal.     Pupils: Pupils are equal, round, and reactive to light.  Neck:     Musculoskeletal: Normal range of motion and neck supple.     Thyroid: No thyromegaly.     Vascular: No JVD.  Trachea: No tracheal deviation.  Cardiovascular:     Rate and Rhythm: Normal rate and regular rhythm.     Heart sounds: Normal heart sounds.  Pulmonary:     Effort: No respiratory distress.     Breath sounds: No stridor. No wheezing.  Abdominal:     General: Bowel sounds are normal. There is no distension.     Palpations: Abdomen is soft. There is no mass.     Tenderness: There is no abdominal tenderness. There is no guarding or rebound.  Musculoskeletal:        General: No tenderness.  Lymphadenopathy:     Cervical: No cervical adenopathy.   Skin:    Findings: No erythema or rash.  Neurological:     Cranial Nerves: No cranial nerve deficit.     Motor: No abnormal muscle tone.     Coordination: Coordination normal.     Deep Tendon Reflexes: Reflexes normal.  Psychiatric:        Behavior: Behavior normal.        Thought Content: Thought content normal.        Judgment: Judgment normal.     Lab Results  Component Value Date   WBC 6.8 11/08/2017   HGB 13.8 11/08/2017   HCT 40.9 11/08/2017   PLT 316.0 11/08/2017   GLUCOSE 94 11/08/2017   CHOL 240 (H) 11/08/2017   TRIG 153.0 (H) 11/08/2017   HDL 53.50 11/08/2017   LDLDIRECT 165.3 09/21/2013   LDLCALC 156 (H) 11/08/2017   ALT 13 11/08/2017   AST 15 11/08/2017   NA 140 11/08/2017   K 4.1 11/08/2017   CL 104 11/08/2017   CREATININE 1.14 11/08/2017   BUN 16 11/08/2017   CO2 28 11/08/2017   TSH 1.31 11/08/2017   HGBA1C 5.5 10/23/2016    Dg Bone Density (dxa)  Result Date: 08/11/2016 EXAM: DUAL X-RAY ABSORPTIOMETRY (DXA) FOR BONE MINERAL DENSITY IMPRESSION: Referring Physician:  Brien Few PATIENT: Name: WOODIE, TRUSTY Patient ID: [id number] Birth Date: 1951/04/07 Height: 66.0 in. Sex: Female Measured: 08/11/2016 Weight: 159.0 lbs. Indications: Estrogen Deficient, Low Calcium Intake (269.3), Postmenopausal Fractures: None Treatments: Vitamin D (E933.5) ASSESSMENT: The BMD measured at Femur Neck Right is 0.887 g/cm2 with a T-score of -1.1. This patient is considered osteopenic according to [facility]) criteria. There has been a statistically significant increase in BMD of Lumbar spine since prior exam dated 08/14/2014. Site Region Measured Date Measured Age YA BMD Significant CHANGE T-score DualFemur Neck Right 08/11/2016    67.5         -1.1    0.887 g/cm2 AP Spine L1-L4 08/11/2016 67.5 -1.0 1.070 g/cm2 * World Health Organization [facility]) criteria for post-menopausal, Caucasian Women: Normal       T-score at or above -1 SD Osteopenia   T-score  between -1 and -2.5 SD Osteoporosis T-score at or below -2.5 SD RECOMMENDATION: [place] recommends that FDA-approved medical therapies be considered in postmenopausal women and men age 71 or older with a: 1. Hip or vertebral (clinical or morphometric) fracture. 2. T-score of <-2.5 at the spine or hip. 3. Ten-year fracture probability by FRAX of 3% or greater for hip fracture or 20% or greater for major osteoporotic fracture. All treatment decisions require clinical judgment and consideration of individual patient factors, including patient preferences, co-morbidities, previous drug use, risk factors not captured in the FRAX model (e.g. falls, vitamin D deficiency, increased bone turnover, interval significant decline in bone density) and possible under - or over-estimation  of fracture risk by FRAX. All patients should ensure an adequate intake of dietary calcium (1200 mg/d) and vitamin D (800 IU daily) unless contraindicated. FOLLOW-UP: People with diagnosed cases of osteoporosis or at high risk for fracture should have regular bone mineral density tests. For patients eligible for Medicare, routine testing is allowed once every 2 years. The testing frequency can be increased to one year for patients who have rapidly progressing disease, those who are receiving or discontinuing medical therapy to restore bone mass, or have additional risk factors. I have reviewed this report, and agree with the above findings. [facility] FRAX* 10-year Probability of Fracture Based on femoral neck BMD: DualFemur (Right) Major Osteoporotic Fracture: 3.9% Hip Fracture:                0.4% Population:                  [place] (Black) Risk Factors:                None *FRAX is a Materials engineer of the [employer] of [employer] for Metabolic Bone Disease, a World Pharmacologist (WHO) [facility]. ASSESSMENT: The probability of a major osteoporotic fracture is 3.9 % within the  next ten years. The probability of a hip fracture is 0.4 % within the next ten years. Electronically Signed   By: Lowella Grip III M.D.   On: 08/11/2016 09:40    Assessment & Plan:   There are no diagnoses linked to this encounter.   No orders of the defined types were placed in this encounter.    Follow-up: No follow-ups on file.  Walker Kehr, MD

## 2019-02-27 ENCOUNTER — Ambulatory Visit
Admission: RE | Admit: 2019-02-27 | Discharge: 2019-02-27 | Disposition: A | Discharge status: Home / Self Care | Payer: Medicare Other | Source: Ambulatory Visit | Attending: Obstetrics and Gynecology | Admitting: Obstetrics and Gynecology

## 2019-02-27 ENCOUNTER — Other Ambulatory Visit: Payer: Self-pay

## 2019-02-27 DIAGNOSIS — E559 Vitamin D deficiency, unspecified: Secondary | ICD-10-CM

## 2019-02-27 DIAGNOSIS — M810 Age-related osteoporosis without current pathological fracture: Secondary | ICD-10-CM

## 2019-03-02 ENCOUNTER — Other Ambulatory Visit (INDEPENDENT_AMBULATORY_CARE_PROVIDER_SITE_OTHER): Payer: Medicare Other

## 2019-03-02 DIAGNOSIS — Z0000 Encounter for general adult medical examination without abnormal findings: Secondary | ICD-10-CM | POA: Diagnosis not present

## 2019-03-02 LAB — HEPATIC FUNCTION PANEL
ALT: 13 U/L (ref 0–35)
AST: 15 U/L (ref 0–37)
Albumin: 4.3 g/dL (ref 3.5–5.2)
Alkaline Phosphatase: 63 U/L (ref 39–117)
Bilirubin, Direct: 0.1 mg/dL (ref 0.0–0.3)
Total Bilirubin: 0.4 mg/dL (ref 0.2–1.2)
Total Protein: 7.6 g/dL (ref 6.0–8.3)

## 2019-03-02 LAB — CBC WITH DIFFERENTIAL/PLATELET
Basophils Absolute: 0.1 10*3/uL (ref 0.0–0.1)
Basophils Relative: 0.8 % (ref 0.0–3.0)
Eosinophils Absolute: 0.1 10*3/uL (ref 0.0–0.7)
Eosinophils Relative: 1.4 % (ref 0.0–5.0)
HCT: 40.5 % (ref 36.0–46.0)
Hemoglobin: 13.5 g/dL (ref 12.0–15.0)
Lymphocytes Relative: 33.2 % (ref 12.0–46.0)
Lymphs Abs: 2.4 10*3/uL (ref 0.7–4.0)
MCHC: 33.3 g/dL (ref 30.0–36.0)
MCV: 92.6 fl (ref 78.0–100.0)
Monocytes Absolute: 0.6 10*3/uL (ref 0.1–1.0)
Monocytes Relative: 7.7 % (ref 3.0–12.0)
Neutro Abs: 4.1 10*3/uL (ref 1.4–7.7)
Neutrophils Relative %: 56.9 % (ref 43.0–77.0)
Platelets: 303 10*3/uL (ref 150.0–400.0)
RBC: 4.37 Mil/uL (ref 3.87–5.11)
RDW: 13.2 % (ref 11.5–15.5)
WBC: 7.2 10*3/uL (ref 4.0–10.5)

## 2019-03-02 LAB — URINALYSIS, ROUTINE W REFLEX MICROSCOPIC
Bilirubin Urine: NEGATIVE
Hgb urine dipstick: NEGATIVE
Ketones, ur: NEGATIVE
Nitrite: NEGATIVE
RBC / HPF: NONE SEEN (ref 0–?)
Specific Gravity, Urine: 1.025 (ref 1.000–1.030)
Total Protein, Urine: NEGATIVE
Urine Glucose: NEGATIVE
Urobilinogen, UA: 0.2 (ref 0.0–1.0)
pH: 6 (ref 5.0–8.0)

## 2019-03-02 LAB — LIPID PANEL
Cholesterol: 237 mg/dL — ABNORMAL HIGH (ref 0–200)
HDL: 52.8 mg/dL (ref >39.00)
LDL Cholesterol: 154 mg/dL — ABNORMAL HIGH (ref 0–99)
NonHDL: 183.81
Total CHOL/HDL Ratio: 4
Triglycerides: 147 mg/dL (ref 0.0–149.0)
VLDL: 29.4 mg/dL (ref 0.0–40.0)

## 2019-03-02 LAB — BASIC METABOLIC PANEL
BUN: 16 mg/dL (ref 6–23)
CO2: 26 mEq/L (ref 19–32)
Calcium: 9.9 mg/dL (ref 8.4–10.5)
Chloride: 106 mEq/L (ref 96–112)
Creatinine, Ser: 1.2 mg/dL (ref 0.40–1.20)
GFR: 53.72 mL/min — ABNORMAL LOW (ref >60.00)
Glucose, Bld: 100 mg/dL — ABNORMAL HIGH (ref 70–99)
Potassium: 4.1 mEq/L (ref 3.5–5.1)
Sodium: 141 mEq/L (ref 135–145)

## 2019-03-02 LAB — TSH: TSH: 2.14 u[IU]/mL (ref 0.35–4.50)

## 2019-03-22 ENCOUNTER — Telehealth: Payer: Self-pay | Admitting: *Deleted

## 2019-03-22 NOTE — Telephone Encounter (Signed)
See 03/02/19 urine results. Today, patient states she is having urinary frequency at night. Does she need a abx?   Notes recorded by Karren Cobble, [place] on 03/03/2019 at 12:43 PM EDT  Pt notified and states she does have frequency at night but that is all.  ------   Notes recorded by Plotnikov, Evie Lacks, MD on 03/03/2019 at 11:33 AM EDT  Joann Fields,  Please inform the patient that all labs are stable, except for slightly elevated lipids: plan-as we discussed. The urine test was borderline abnormal-we should treat her with an antibiotic if Ms. Roland has urinary urgency, frequency or pain on urination.  Thanks,  AP

## 2019-03-23 MED ORDER — CIPROFLOXACIN HCL 250 MG PO TABS: 250.0000 mg | ORAL_TABLET | Freq: Two times a day (BID) | ORAL | 0 refills | Status: DC

## 2019-03-23 NOTE — Telephone Encounter (Signed)
Joann Fields  I emailed an antibiotic prescription.  Thanks

## 2019-03-24 NOTE — Telephone Encounter (Signed)
Pt informed of below.  

## 2019-08-25 HISTORY — PX: CATARACT EXTRACTION, BILATERAL: SHX1313

## 2019-09-25 DIAGNOSIS — H52202 Unspecified astigmatism, left eye: Secondary | ICD-10-CM | POA: Diagnosis not present

## 2019-09-25 DIAGNOSIS — H2512 Age-related nuclear cataract, left eye: Secondary | ICD-10-CM | POA: Diagnosis not present

## 2019-09-26 DIAGNOSIS — H2511 Age-related nuclear cataract, right eye: Secondary | ICD-10-CM | POA: Diagnosis not present

## 2019-10-04 DIAGNOSIS — L811 Chloasma: Secondary | ICD-10-CM | POA: Diagnosis not present

## 2019-10-04 DIAGNOSIS — L810 Postinflammatory hyperpigmentation: Secondary | ICD-10-CM | POA: Diagnosis not present

## 2019-10-23 DIAGNOSIS — H52201 Unspecified astigmatism, right eye: Secondary | ICD-10-CM | POA: Diagnosis not present

## 2019-10-23 DIAGNOSIS — H2511 Age-related nuclear cataract, right eye: Secondary | ICD-10-CM | POA: Diagnosis not present

## 2019-12-06 DIAGNOSIS — Z01419 Encounter for gynecological examination (general) (routine) without abnormal findings: Secondary | ICD-10-CM | POA: Diagnosis not present

## 2019-12-06 DIAGNOSIS — Z1231 Encounter for screening mammogram for malignant neoplasm of breast: Secondary | ICD-10-CM | POA: Diagnosis not present

## 2019-12-12 ENCOUNTER — Other Ambulatory Visit: Payer: Self-pay | Admitting: Internal Medicine

## 2020-01-04 ENCOUNTER — Encounter: Payer: Self-pay | Admitting: Gastroenterology

## 2020-01-09 ENCOUNTER — Other Ambulatory Visit: Payer: Self-pay | Admitting: Internal Medicine

## 2020-01-25 ENCOUNTER — Encounter: Payer: Self-pay | Admitting: Internal Medicine

## 2020-01-25 ENCOUNTER — Other Ambulatory Visit: Payer: Self-pay

## 2020-01-25 ENCOUNTER — Ambulatory Visit (INDEPENDENT_AMBULATORY_CARE_PROVIDER_SITE_OTHER): Payer: Medicare PPO | Admitting: Internal Medicine

## 2020-01-25 ENCOUNTER — Ambulatory Visit: Payer: Self-pay

## 2020-01-25 VITALS — BP 150/90 | HR 80 | Temp 98.3°F | Ht 65.0 in | Wt 161.0 lb

## 2020-01-25 DIAGNOSIS — I10 Essential (primary) hypertension: Secondary | ICD-10-CM | POA: Diagnosis not present

## 2020-01-25 DIAGNOSIS — L600 Ingrowing nail: Secondary | ICD-10-CM

## 2020-01-25 DIAGNOSIS — N3281 Overactive bladder: Secondary | ICD-10-CM | POA: Diagnosis not present

## 2020-01-25 DIAGNOSIS — E785 Hyperlipidemia, unspecified: Secondary | ICD-10-CM | POA: Diagnosis not present

## 2020-01-25 DIAGNOSIS — Z0000 Encounter for general adult medical examination without abnormal findings: Secondary | ICD-10-CM

## 2020-01-25 DIAGNOSIS — M79604 Pain in right leg: Secondary | ICD-10-CM

## 2020-01-25 DIAGNOSIS — Z0001 Encounter for general adult medical examination with abnormal findings: Secondary | ICD-10-CM | POA: Diagnosis not present

## 2020-01-25 DIAGNOSIS — E538 Deficiency of other specified B group vitamins: Secondary | ICD-10-CM

## 2020-01-25 MED ORDER — OXYBUTYNIN CHLORIDE 5 MG PO TABS
5.0000 mg | ORAL_TABLET | Freq: Every evening | ORAL | 3 refills | Status: DC | PRN
Start: 2020-01-25 — End: 2020-04-29

## 2020-01-25 NOTE — Patient Instructions (Signed)

## 2020-01-25 NOTE — Progress Notes (Signed)
Negative   Subjective:  Patient ID: Joann Fields, female    DOB: 04-15-1951  Age: 69 y.o. MRN: [id number]  CC: No chief complaint on file.   HPI Joann Fields presents for a well exam C/o pain in feet for some time Complaining of on frequency at night, bothersome when she is traveling.  [name] would like something to be prescribed for her to use as needed.  Follow-up on high blood pressure  Outpatient Medications Prior to Visit  Medication Sig Dispense Refill  . CALCIUM-VITAMIN D PO Take 1 capsule by mouth daily. Takes 600 mg    . Cyanocobalamin (VITAMIN B-12) 1000 MCG SUBL PLACE 1 TABLET UNDER THE TONGUE DAILY. 100 tablet 3  . diltiazem (CARDIZEM CD) 180 MG 24 hr capsule TAKE 1 CAPSULE BY MOUTH EVERY DAY 90 capsule 3  . nepafenac (ILEVRO) 0.3 % ophthalmic suspension Place 1 drop into both eyes daily.    . DUREZOL 0.05 % EMUL Place 1 drop into the left eye 3 (three) times daily.    . fluticasone (FLONASE) 50 MCG/ACT nasal spray Place 2 sprays into both nostrils daily. (Patient not taking: Reported on 01/25/2020) 16 g 6  . ranitidine (ZANTAC) 150 MG tablet TAKE 1 TABLET (150 MG TOTAL) BY MOUTH 2 (TWO) TIMES DAILY. (Patient not taking: Reported on 01/25/2020) 60 tablet 2  . ciprofloxacin (CIPRO) 250 MG tablet Take 1 tablet (250 mg total) by mouth 2 (two) times daily. (Patient not taking: Reported on 01/25/2020) 6 tablet 0   Facility-Administered Medications Prior to Visit  Medication Dose Route Frequency Provider Last Rate Last Admin  . 0.9 %  sodium chloride infusion  500 mL Intravenous Continuous Milus Banister, MD        ROS: Review of Systems  Constitutional: Negative for activity change, appetite change, chills, fatigue and unexpected weight change.  HENT: Negative for congestion, mouth sores and sinus pressure.   Eyes: Negative for visual disturbance.  Respiratory: Negative for cough and chest tightness.   Gastrointestinal: Negative for abdominal pain and nausea.   Genitourinary: Negative for difficulty urinating, frequency and vaginal pain.  Musculoskeletal: Positive for gait problem. Negative for back pain.  Skin: Negative for pallor and rash.  Neurological: Negative for dizziness, tremors, weakness, numbness and headaches.  Psychiatric/Behavioral: Negative for confusion and sleep disturbance.    Objective:  Ht 5\' 5"  (1.651 m)   Wt 161 lb (73 kg)   BMI 26.79 kg/m   BP Readings from Last 3 Encounters:  02/23/19 132/84  09/09/18 130/86  12/23/17 134/78    Wt Readings from Last 3 Encounters:  01/25/20 161 lb (73 kg)  02/23/19 162 lb (73.5 kg)  09/09/18 155 lb (70.3 kg)    Physical Exam Constitutional:      General: She is not in acute distress.    Appearance: She is well-developed.  HENT:     Head: Normocephalic.     Right Ear: External ear normal.     Left Ear: External ear normal.     Nose: Nose normal.  Eyes:     General:        Right eye: No discharge.        Left eye: No discharge.     Conjunctiva/sclera: Conjunctivae normal.     Pupils: Pupils are equal, round, and reactive to light.  Neck:     Thyroid: No thyromegaly.     Vascular: No JVD.     Trachea: No tracheal deviation.  Cardiovascular:  Rate and Rhythm: Normal rate and regular rhythm.     Heart sounds: Normal heart sounds.  Pulmonary:     Effort: No respiratory distress.     Breath sounds: No stridor. No wheezing.  Abdominal:     General: Bowel sounds are normal. There is no distension.     Palpations: Abdomen is soft. There is no mass.     Tenderness: There is no abdominal tenderness. There is no guarding or rebound.  Musculoskeletal:        General: Tenderness present.     Cervical back: Normal range of motion and neck supple.  Lymphadenopathy:     Cervical: No cervical adenopathy.  Skin:    Findings: No erythema or rash.  Neurological:     Cranial Nerves: No cranial nerve deficit.     Motor: No abnormal muscle tone.     Coordination:  Coordination normal.     Deep Tendon Reflexes: Reflexes normal.  Psychiatric:        Behavior: Behavior normal.        Thought Content: Thought content normal.        Judgment: Judgment normal.     Lab Results  Component Value Date   WBC 7.2 03/02/2019   HGB 13.5 03/02/2019   HCT 40.5 03/02/2019   PLT 303.0 03/02/2019   GLUCOSE 100 (H) 03/02/2019   CHOL 237 (H) 03/02/2019   TRIG 147.0 03/02/2019   HDL 52.80 03/02/2019   LDLDIRECT 165.3 09/21/2013   LDLCALC 154 (H) 03/02/2019   ALT 13 03/02/2019   AST 15 03/02/2019   NA 141 03/02/2019   K 4.1 03/02/2019   CL 106 03/02/2019   CREATININE 1.20 03/02/2019   BUN 16 03/02/2019   CO2 26 03/02/2019   TSH 2.14 03/02/2019   HGBA1C 5.5 10/23/2016    DG BONE DENSITY (DXA)  Result Date: 02/27/2019 EXAM: DUAL X-RAY ABSORPTIOMETRY (DXA) FOR BONE MINERAL DENSITY IMPRESSION: Referring Physician:  Brien Few Your patient completed a BMD test using Lunar IDXA DXA system ( analysis version: 16 ) manufactured by [employer]. Technologist: AW PATIENT: Name: Joann Fields Patient ID: [id number] Birth Date: [birth year] Height: 65.0 in. Sex: Female Measured: 02/27/2019 Weight: 161.0 lbs. Indications: Advanced Age, Estrogen Deficient, History of Osteopenia, Low Calcium Intake (269.3), osteoarthiritis, Postmenopausal Fractures: None Treatments: Calcium (E943.0), Vitamin D (E933.5) ASSESSMENT: The BMD measured at Femur Neck Left is 0.831 g/cm2 with a T-score of -1.5. This patient is considered OSTEOPENIC according to [facility]) criteria. There has been no statistically significant change in BMD of Total Mean since prior exam dated 08/11/2016. The scan quality is good. Site Region Measured Date Measured Age YA T-score BMD Significant CHANGE AP Spine  L1-L4      02[age]27/2019    70.1         -1.1    1.063 g/cm2 AP Spine L1-L4 08/11/2016 67.5 -1.0 1.070 g/cm2 * DualFemur Neck Left 02/27/2019 70.1 -1.5 0.831 g/cm2 * DualFemur Neck Left   08/11/2016    67.5         -0.9    0.909 g/cm2 DualFemur Total Mean 02/27/2019    70.1         -1.0    0.883 g/cm2 DualFemur Total Mean 08/11/2016    67.5         -0.8    0.903 g/cm2 World Health Organization [facility]) criteria for post-menopausal, Caucasian Women: Normal       T-score at or above -1 SD Osteopenia  T-score between -1 and -2.5 SD Osteoporosis T-score at or below -2.5 SD RECOMMENDATION: 1. All patients should optimize calcium and vitamin D intake. 2. Consider FDA approved medical therapies in postmenopausal women and men aged 71 years and older, based on the following: a. A hip or vertebral (clinical or morphometric) fracture b. T- score < or = -2.5 at the femoral neck or spine after appropriate evaluation to exclude secondary causes c. Low bone mass (T-score between -1.0 and -2.5 at the femoral neck or spine) and a 10 year probability of a hip fracture > or = 3% or a 10 year probability of a major osteoporosis-related fracture > or = 20% based on the [place]-adapted WHO algorithm d. Clinician judgment and/or patient preferences may indicate treatment for people with 10-year fracture probabilities above or below these levels FOLLOW-UP: People with diagnosed cases of osteoporosis or at high risk for fracture should have regular bone mineral density tests. For patients eligible for Medicare, routine testing is allowed once every 2 years. The testing frequency can be increased to one year for patients who have rapidly progressing disease, those who are receiving or discontinuing medical therapy to restore bone mass, or have additional risk factors. I have reviewed this report and agree with the above findings. Mark A. Thornton Papas, M.D. [facility] FRAX* 10-year Probability of Fracture Based on femoral neck BMD: DualFemur (Left) Major Osteoporotic Fracture: 4.4% Hip Fracture:                0.6% Population:                  [place] (Black) Risk Factors:                None *FRAX is a Materials engineer of the [employer] of  [employer] for Metabolic Bone Disease, a World Pharmacologist (WHO) [facility]. ASSESSMENT: The probability of a major osteoporotic fracture is 4.4 % within the next ten years. The probability of a hip fracture is 0.6 % within the next ten years. I have reviewed this report and agree with the above findings. Mark A. Thornton Papas, M.D. [facility] Electronically Signed   By: Lavonia Dana M.D.   On: 02/27/2019 14:40    Assessment & Plan:   Walker Kehr, MD

## 2020-01-25 NOTE — Assessment & Plan Note (Signed)
Diltiazem  CT coronary ca scoring test ordered

## 2020-01-25 NOTE — Assessment & Plan Note (Signed)
  We discussed age appropriate health related issues, including available/recomended screening tests and vaccinations. Labs were ordered to be later reviewed . All questions were answered. We discussed one or more of the following - seat belt use, use of sunscreen/sun exposure exercise, safe sex, fall risk reduction, second hand smoke exposure, firearm use and storage, seat belt use, a need for adhering to healthy diet and exercise. Labs were ordered . All questions were answered.  CT coronary ca scoring test ordered

## 2020-01-25 NOTE — Assessment & Plan Note (Signed)
Nighttime Ditropan prn at night for traveling

## 2020-01-25 NOTE — Assessment & Plan Note (Signed)
On B12 

## 2020-01-26 ENCOUNTER — Other Ambulatory Visit: Payer: Self-pay

## 2020-01-26 ENCOUNTER — Ambulatory Visit (AMBULATORY_SURGERY_CENTER): Payer: Self-pay

## 2020-01-26 ENCOUNTER — Other Ambulatory Visit (INDEPENDENT_AMBULATORY_CARE_PROVIDER_SITE_OTHER): Payer: Medicare PPO

## 2020-01-26 VITALS — Ht 65.0 in | Wt 162.0 lb

## 2020-01-26 DIAGNOSIS — Z0000 Encounter for general adult medical examination without abnormal findings: Secondary | ICD-10-CM

## 2020-01-26 DIAGNOSIS — Z86010 Personal history of colonic polyps: Secondary | ICD-10-CM

## 2020-01-26 DIAGNOSIS — E785 Hyperlipidemia, unspecified: Secondary | ICD-10-CM

## 2020-01-26 DIAGNOSIS — H1045 Other chronic allergic conjunctivitis: Secondary | ICD-10-CM | POA: Diagnosis not present

## 2020-01-26 LAB — URINALYSIS, ROUTINE W REFLEX MICROSCOPIC
Bilirubin Urine: NEGATIVE
Hgb urine dipstick: NEGATIVE
Ketones, ur: NEGATIVE
Nitrite: NEGATIVE
Specific Gravity, Urine: 1.025 (ref 1.000–1.030)
Total Protein, Urine: NEGATIVE
Urine Glucose: NEGATIVE
Urobilinogen, UA: 0.2 (ref 0.0–1.0)
pH: 6 (ref 5.0–8.0)

## 2020-01-26 LAB — BASIC METABOLIC PANEL
BUN: 14 mg/dL (ref 6–23)
CO2: 29 mEq/L (ref 19–32)
Calcium: 10 mg/dL (ref 8.4–10.5)
Chloride: 106 mEq/L (ref 96–112)
Creatinine, Ser: 1.11 mg/dL (ref 0.40–1.20)
GFR: 58.62 mL/min — ABNORMAL LOW (ref >60.00)
Glucose, Bld: 98 mg/dL (ref 70–99)
Potassium: 3.9 mEq/L (ref 3.5–5.1)
Sodium: 140 mEq/L (ref 135–145)

## 2020-01-26 LAB — CBC WITH DIFFERENTIAL/PLATELET
Basophils Absolute: 0.1 10*3/uL (ref 0.0–0.1)
Basophils Relative: 0.7 % (ref 0.0–3.0)
Eosinophils Absolute: 0.1 10*3/uL (ref 0.0–0.7)
Eosinophils Relative: 0.9 % (ref 0.0–5.0)
HCT: 39.4 % (ref 36.0–46.0)
Hemoglobin: 13.2 g/dL (ref 12.0–15.0)
Lymphocytes Relative: 26.1 % (ref 12.0–46.0)
Lymphs Abs: 2 10*3/uL (ref 0.7–4.0)
MCHC: 33.6 g/dL (ref 30.0–36.0)
MCV: 92.8 fl (ref 78.0–100.0)
Monocytes Absolute: 0.5 10*3/uL (ref 0.1–1.0)
Monocytes Relative: 6.8 % (ref 3.0–12.0)
Neutro Abs: 5 10*3/uL (ref 1.4–7.7)
Neutrophils Relative %: 65.5 % (ref 43.0–77.0)
Platelets: 264 10*3/uL (ref 150.0–400.0)
RBC: 4.25 Mil/uL (ref 3.87–5.11)
RDW: 13.7 % (ref 11.5–15.5)
WBC: 7.7 10*3/uL (ref 4.0–10.5)

## 2020-01-26 LAB — LIPID PANEL
Cholesterol: 234 mg/dL — ABNORMAL HIGH (ref 0–200)
HDL: 47.5 mg/dL (ref >39.00)
LDL Cholesterol: 154 mg/dL — ABNORMAL HIGH (ref 0–99)
NonHDL: 186.21
Total CHOL/HDL Ratio: 5
Triglycerides: 160 mg/dL — ABNORMAL HIGH (ref 0.0–149.0)
VLDL: 32 mg/dL (ref 0.0–40.0)

## 2020-01-26 LAB — HEPATIC FUNCTION PANEL
ALT: 11 U/L (ref 0–35)
AST: 13 U/L (ref 0–37)
Albumin: 4.1 g/dL (ref 3.5–5.2)
Alkaline Phosphatase: 62 U/L (ref 39–117)
Bilirubin, Direct: 0 mg/dL (ref 0.0–0.3)
Total Bilirubin: 0.4 mg/dL (ref 0.2–1.2)
Total Protein: 7.1 g/dL (ref 6.0–8.3)

## 2020-01-26 LAB — TSH: TSH: 1.36 u[IU]/mL (ref 0.35–4.50)

## 2020-01-26 NOTE — Progress Notes (Signed)
No egg or soy allergy known to patient  No issues with past sedation with any surgeries  or procedures, no intubation problems  No diet pills per patient No home 02 use per patient  No blood thinners per patient  Pt denies issues with constipation  No A fib or A flutter  EMMI video sent to pt's e mail   Pt has been vaccinated for covid.  Due to the COVID-19 pandemic we are asking patients to follow these guidelines. Please only bring one care partner. Please be aware that your care partner may wait in the car in the parking lot or if they feel like they will be too hot to wait in the car, they may wait in the lobby on the 4th floor. All care partners are required to wear a mask the entire time (we do not have any that we can provide them), they need to practice social distancing, and we will do a Covid check for all patient's and care partners when you arrive. Also we will check their temperature and your temperature. If the care partner waits in their car they need to stay in the parking lot the entire time and we will call them on their cell phone when the patient is ready for discharge so they can bring the car to the front of the building. Also all patient's will need to wear a mask into building.

## 2020-01-27 ENCOUNTER — Encounter: Payer: Self-pay | Admitting: Internal Medicine

## 2020-01-27 NOTE — Assessment & Plan Note (Signed)
Bilateral foot pain.  Probable capsulitis.  Podiatry referral.

## 2020-02-02 ENCOUNTER — Other Ambulatory Visit: Payer: Self-pay

## 2020-02-02 ENCOUNTER — Ambulatory Visit: Payer: Medicare PPO | Admitting: Podiatry

## 2020-02-02 DIAGNOSIS — L600 Ingrowing nail: Secondary | ICD-10-CM

## 2020-02-02 DIAGNOSIS — B351 Tinea unguium: Secondary | ICD-10-CM

## 2020-02-02 MED ORDER — NONFORMULARY OR COMPOUNDED ITEM
3 refills | Status: DC
Start: 2020-02-02 — End: 2020-07-29

## 2020-02-06 NOTE — Progress Notes (Signed)
Subjective:   Patient ID: Joann Fields, female   DOB: 69 y.o.   MRN: [id number]   HPI 69 year old female presents the office today for concerns of her left second, right big toenail, thickened discolored.  She has occasional tenderness to palpation of the nail but denies any redness or drainage or any swelling.  She had no recent treatment for this.  She has no other concerns today.   Review of Systems  All other systems reviewed and are negative.  Past Medical History:  Diagnosis Date  . Cataract    removed 2021  . GERD (gastroesophageal reflux disease)   . Hyperlipidemia   . Hypertension   . Osteopenia     Past Surgical History:  Procedure Laterality Date  . CATARACT EXTRACTION, BILATERAL Bilateral 2021   feb- right march-left  . COLONOSCOPY  2018  . DG THUMB LEFT HAND     cut her hand while prepping chicken for supper  . POLYPECTOMY    . SVD x 2    . WISDOM TOOTH EXTRACTION       Current Outpatient Medications:  .  CALCIUM-VITAMIN D PO, Take 1 capsule by mouth daily. Takes 600 mg, Disp: , Rfl:  .  Calcium-Vitamin D-Vitamin K 251 123 2625-90 MG-UNT-MCG TABS, Take 1 Dose by mouth in the morning and at bedtime. Chocolate chewable, Disp: , Rfl:  .  Cyanocobalamin (VITAMIN B-12) 1000 MCG SUBL, PLACE 1 TABLET UNDER THE TONGUE DAILY., Disp: 100 tablet, Rfl: 3 .  diltiazem (CARDIZEM CD) 180 MG 24 hr capsule, TAKE 1 CAPSULE BY MOUTH EVERY DAY, Disp: 90 capsule, Rfl: 3 .  DUREZOL 0.05 % EMUL, Place 1 drop into the left eye 3 (three) times daily., Disp: , Rfl:  .  fluticasone (FLONASE) 50 MCG/ACT nasal spray, Place 2 sprays into both nostrils daily., Disp: 16 g, Rfl: 6 .  nepafenac (ILEVRO) 0.3 % ophthalmic suspension, Place 1 drop into both eyes daily., Disp: , Rfl:  .  NONFORMULARY OR COMPOUNDED ITEM, Antifungal solution: Terbinafine 3%, Fluconazole 2%, Tea Tree Oil 5%, Urea 10%, Ibuprofen 2% in DMSO suspension #44mL, Disp: 1 each, Rfl: 3 .  oxybutynin (DITROPAN) 5 MG tablet, Take  1 tablet (5 mg total) by mouth at bedtime as needed for bladder spasms., Disp: 30 tablet, Rfl: 3 .  ranitidine (ZANTAC) 150 MG tablet, TAKE 1 TABLET (150 MG TOTAL) BY MOUTH 2 (TWO) TIMES DAILY. (Patient not taking: Reported on 01/25/2020), Disp: 60 tablet, Rfl: 2  Current Facility-Administered Medications:  .  0.9 %  sodium chloride infusion, 500 mL, Intravenous, Continuous, Milus Banister, MD  Allergies  Allergen Reactions  . Amoxicillin Rash          Objective:  Physical Exam  General: AAO x3, NAD  Dermatological: Nails appear to be hypertrophic, dystrophic with yellow-brown discoloration.  Mild incurvation present to the second digit on the left side right big toenail there is no edema, erythema or any signs of infection.  No significant pain with ingrown toenail today.  Vascular: Dorsalis Pedis artery and Posterior Tibial artery pedal pulses are 2/4 bilateral with immedate capillary fill time. There is no pain with calf compression, swelling, warmth, erythema.   Neruologic: Grossly intact via light touch bilateral.   Musculoskeletal: No gross boney pedal deformities bilateral. No pain, crepitus, or limitation noted with foot and ankle range of motion bilateral. Muscular strength 5/5 in all groups tested bilateral.  Gait: Unassisted, Nonantalgic.       Assessment:   Onychomycosis left second  digit toenail, right hallux; ingrown toenail currently asymptomatic    Plan:  -Treatment options discussed including all alternatives, risks, and complications -Etiology of symptoms were discussed -I discussed toenail removal, she was hold off on this.  Discussed other treatment options including treatment for nail fungus.  We discussed oral and topical medication.  She wants to proceed with topical.  I ordered a compound cream today through [employer].  Discussed duration of use as well as side effects.  Trula Slade DPM

## 2020-02-09 ENCOUNTER — Ambulatory Visit (AMBULATORY_SURGERY_CENTER): Payer: Medicare PPO | Admitting: Gastroenterology

## 2020-02-09 ENCOUNTER — Encounter: Payer: Self-pay | Admitting: Gastroenterology

## 2020-02-09 ENCOUNTER — Other Ambulatory Visit: Payer: Self-pay

## 2020-02-09 VITALS — BP 120/73 | HR 57 | Temp 97.1°F | Resp 17 | Ht 65.0 in | Wt 162.0 lb

## 2020-02-09 DIAGNOSIS — D120 Benign neoplasm of cecum: Secondary | ICD-10-CM | POA: Diagnosis not present

## 2020-02-09 DIAGNOSIS — D122 Benign neoplasm of ascending colon: Secondary | ICD-10-CM | POA: Diagnosis not present

## 2020-02-09 DIAGNOSIS — I10 Essential (primary) hypertension: Secondary | ICD-10-CM | POA: Diagnosis not present

## 2020-02-09 DIAGNOSIS — Z86010 Personal history of colonic polyps: Secondary | ICD-10-CM

## 2020-02-09 DIAGNOSIS — K219 Gastro-esophageal reflux disease without esophagitis: Secondary | ICD-10-CM | POA: Diagnosis not present

## 2020-02-09 MED ORDER — SODIUM CHLORIDE 0.9 % IV SOLN: 500.0000 mL | INTRAVENOUS | Status: DC

## 2020-02-09 NOTE — Op Note (Signed)
[facility] Patient Name: Joann Fields Procedure Date: 02/09/2020 10:23 AM MRN: [id number] Endoscopist: Milus Banister , MD Age: 69 Referring MD:  Date of Birth: August 08, 1951 Gender: Female Account #: [id number] Procedure:                Colonoscopy Indications:              High risk colon cancer surveillance: Personal                            history of colonic polyps; Colonoscopy 2018 three                            subCM adenomas removed Medicines:                Monitored Anesthesia Care Procedure:                Pre-Anesthesia Assessment:                           - Prior to the procedure, a History and Physical                            was performed, and patient medications and                            allergies were reviewed. The patient's tolerance of                            previous anesthesia was also reviewed. The risks                            and benefits of the procedure and the sedation                            options and risks were discussed with the patient.                            All questions were answered, and informed consent                            was obtained. Prior Anticoagulants: The patient has                            taken no previous anticoagulant or antiplatelet                            agents. ASA Grade Assessment: II - A patient with                            mild systemic disease. After reviewing the risks                            and benefits, the patient was deemed in  satisfactory condition to undergo the procedure.                           After obtaining informed consent, the colonoscope                            was passed under direct vision. Throughout the                            procedure, the patient's blood pressure, pulse, and                            oxygen saturations were monitored continuously. The                            Colonoscope was introduced through the  anus and                            advanced to the the cecum, identified by                            appendiceal orifice and ileocecal valve. The                            colonoscopy was performed without difficulty. The                            patient tolerated the procedure well. The quality                            of the bowel preparation was good. The ileocecal                            valve, appendiceal orifice, and rectum were                            photographed. Scope In: 10:27:42 AM Scope Out: 10:38:47 AM Scope Withdrawal Time: 0 hours 8 minutes 17 seconds  Total Procedure Duration: 0 hours 11 minutes 5 seconds  Findings:                 Three sessile polyps were found in the ascending                            colon and cecum. The polyps were 2 to 3 mm in size.                            These polyps were removed with a cold snare.                            Resection and retrieval were complete.                           The exam was otherwise without abnormality on  direct and retroflexion views. Complications:            No immediate complications. Estimated blood loss:                            None. Estimated Blood Loss:     Estimated blood loss: none. Impression:               - Three 2 to 3 mm polyps in the ascending colon and                            in the cecum, removed with a cold snare. Resected                            and retrieved.                           - The examination was otherwise normal on direct                            and retroflexion views. Recommendation:           - Patient has a contact number available for                            emergencies. The signs and symptoms of potential                            delayed complications were discussed with the                            patient. Return to normal activities tomorrow.                            Written discharge instructions were provided  to the                            patient.                           - Resume previous diet.                           - Continue present medications.                           - Await pathology results. Milus Banister, MD 02/09/2020 10:43:38 AM This report has been signed electronically.

## 2020-02-09 NOTE — Progress Notes (Signed)
Vs cw I have reviewed the patient's medical history in detail and updated the computerized patient record. 

## 2020-02-09 NOTE — Progress Notes (Signed)
Called to room to assist during endoscopic procedure.  Patient ID and intended procedure confirmed with present staff. Received instructions for my participation in the procedure from the performing physician.  

## 2020-02-09 NOTE — Patient Instructions (Signed)
HANDOUTS PROVIDED ON: POLYPS  The polyps removed today have been sent for pathology.  The results can take 1-3 weeks to receive.  When your next colonoscopy should occur will be based on the pathology results.    You may resume your previous diet and medication schedule.  Thank you for allowing us to care for you today!!!   YOU HAD AN ENDOSCOPIC PROCEDURE TODAY AT THE [facility]:   Refer to the procedure report that was given to you for any specific questions about what was found during the examination.  If the procedure report does not answer your questions, please call your gastroenterologist to clarify.  If you requested that your care partner not be given the details of your procedure findings, then the procedure report has been included in a sealed envelope for you to review at your convenience later.  YOU SHOULD EXPECT: Some feelings of bloating in the abdomen. Passage of more gas than usual.  Walking can help get rid of the air that was put into your GI tract during the procedure and reduce the bloating. If you had a lower endoscopy (such as a colonoscopy or flexible sigmoidoscopy) you may notice spotting of blood in your stool or on the toilet paper. If you underwent a bowel prep for your procedure, you may not have a normal bowel movement for a few days.  Please Note:  You might notice some irritation and congestion in your nose or some drainage.  This is from the oxygen used during your procedure.  There is no need for concern and it should clear up in a day or so.  SYMPTOMS TO REPORT IMMEDIATELY:   Following lower endoscopy (colonoscopy or flexible sigmoidoscopy):  Excessive amounts of blood in the stool  Significant tenderness or worsening of abdominal pains  Swelling of the abdomen that is new, acute  Fever of 100F or higher  For urgent or emergent issues, a gastroenterologist can be reached at any hour by calling [phone]. Do not use MyChart messaging for  urgent concerns.    DIET:  We do recommend a small meal at first, but then you may proceed to your regular diet.  Drink plenty of fluids but you should avoid alcoholic beverages for 24 hours.  ACTIVITY:  You should plan to take it easy for the rest of today and you should NOT DRIVE or use heavy machinery until tomorrow (because of the sedation medicines used during the test).    FOLLOW UP: Our staff will call the number listed on your records 48-72 hours following your procedure to check on you and address any questions or concerns that you may have regarding the information given to you following your procedure. If we do not reach you, we will leave a message.  We will attempt to reach you two times.  During this call, we will ask if you have developed any symptoms of COVID 19. If you develop any symptoms (ie: fever, flu-like symptoms, shortness of breath, cough etc.) before then, please call [phone].  If you test positive for Covid 19 in the 2 weeks post procedure, please call and report this information to us.    If any biopsies were taken you will be contacted by phone or by letter within the next 1-3 weeks.  Please call us at [phone] if you have not heard about the biopsies in 3 weeks.    SIGNATURES/CONFIDENTIALITY: You and/or your care partner have signed paperwork which will be entered into   your electronic medical record.  These signatures attest to the fact that that the information above on your After Visit Summary has been reviewed and is understood.  Full responsibility of the confidentiality of this discharge information lies with you and/or your care-partner.

## 2020-02-09 NOTE — Progress Notes (Signed)
Report to PACU, RN, vss, BBS= Clear.  

## 2020-02-13 ENCOUNTER — Telehealth: Payer: Self-pay

## 2020-02-13 NOTE — Telephone Encounter (Signed)
  Follow up Call-  Call back number 02/09/2020  Post procedure Call Back phone  # [phone]  Permission to leave phone message Yes  Some recent data might be hidden     Patient questions:  Do you have a fever, pain , or abdominal swelling? No. Pain Score  0 *  Have you tolerated food without any problems? Yes.    Have you been able to return to your normal activities? Yes.    Do you have any questions about your discharge instructions: Diet   No. Medications  No. Follow up visit  No.  Do you have questions or concerns about your Care? No.  Actions: * If pain score is 4 or above: No action needed, pain <4. 1. Have you developed a fever since your procedure? no  2.   Have you had an respiratory symptoms (SOB or cough) since your procedure? no  3.   Have you tested positive for COVID 19 since your procedure n  4.   Have you had any family members/close contacts diagnosed with the COVID 19 since your procedure?  n o   If yes to any of these questions please route to Joylene John, RN and Erenest Rasher, RN

## 2020-02-14 ENCOUNTER — Encounter: Payer: Self-pay | Admitting: Gastroenterology

## 2020-02-16 ENCOUNTER — Encounter: Payer: Self-pay | Admitting: Internal Medicine

## 2020-03-06 ENCOUNTER — Ambulatory Visit (INDEPENDENT_AMBULATORY_CARE_PROVIDER_SITE_OTHER)
Admission: RE | Admit: 2020-03-06 | Discharge: 2020-03-06 | Disposition: A | Discharge status: Home / Self Care | Payer: Self-pay | Source: Ambulatory Visit | Attending: Internal Medicine | Admitting: Internal Medicine

## 2020-03-06 ENCOUNTER — Other Ambulatory Visit: Payer: Self-pay

## 2020-03-06 DIAGNOSIS — E785 Hyperlipidemia, unspecified: Secondary | ICD-10-CM

## 2020-04-28 ENCOUNTER — Other Ambulatory Visit: Payer: Self-pay | Admitting: Internal Medicine

## 2020-06-03 DIAGNOSIS — H2000 Unspecified acute and subacute iridocyclitis: Secondary | ICD-10-CM | POA: Diagnosis not present

## 2020-06-03 DIAGNOSIS — Z961 Presence of intraocular lens: Secondary | ICD-10-CM | POA: Diagnosis not present

## 2020-06-03 DIAGNOSIS — H04123 Dry eye syndrome of bilateral lacrimal glands: Secondary | ICD-10-CM | POA: Diagnosis not present

## 2020-06-11 DIAGNOSIS — H2000 Unspecified acute and subacute iridocyclitis: Secondary | ICD-10-CM | POA: Diagnosis not present

## 2020-06-11 DIAGNOSIS — I10 Essential (primary) hypertension: Secondary | ICD-10-CM | POA: Diagnosis not present

## 2020-06-11 DIAGNOSIS — H04123 Dry eye syndrome of bilateral lacrimal glands: Secondary | ICD-10-CM | POA: Diagnosis not present

## 2020-07-02 DIAGNOSIS — Z961 Presence of intraocular lens: Secondary | ICD-10-CM | POA: Insufficient documentation

## 2020-07-02 DIAGNOSIS — H35033 Hypertensive retinopathy, bilateral: Secondary | ICD-10-CM | POA: Diagnosis not present

## 2020-07-02 DIAGNOSIS — H30033 Focal chorioretinal inflammation, peripheral, bilateral: Secondary | ICD-10-CM | POA: Diagnosis not present

## 2020-07-02 DIAGNOSIS — H3581 Retinal edema: Secondary | ICD-10-CM | POA: Insufficient documentation

## 2020-07-02 DIAGNOSIS — H209 Unspecified iridocyclitis: Secondary | ICD-10-CM | POA: Insufficient documentation

## 2020-07-02 DIAGNOSIS — I10 Essential (primary) hypertension: Secondary | ICD-10-CM | POA: Diagnosis not present

## 2020-07-09 DIAGNOSIS — Z809 Family history of malignant neoplasm, unspecified: Secondary | ICD-10-CM | POA: Diagnosis not present

## 2020-07-09 DIAGNOSIS — H30033 Focal chorioretinal inflammation, peripheral, bilateral: Secondary | ICD-10-CM | POA: Diagnosis not present

## 2020-07-09 DIAGNOSIS — Z8249 Family history of ischemic heart disease and other diseases of the circulatory system: Secondary | ICD-10-CM | POA: Diagnosis not present

## 2020-07-09 DIAGNOSIS — E785 Hyperlipidemia, unspecified: Secondary | ICD-10-CM | POA: Diagnosis not present

## 2020-07-09 DIAGNOSIS — I10 Essential (primary) hypertension: Secondary | ICD-10-CM | POA: Diagnosis not present

## 2020-07-09 DIAGNOSIS — M8580 Other specified disorders of bone density and structure, unspecified site: Secondary | ICD-10-CM | POA: Diagnosis not present

## 2020-07-09 DIAGNOSIS — E538 Deficiency of other specified B group vitamins: Secondary | ICD-10-CM | POA: Diagnosis not present

## 2020-07-09 DIAGNOSIS — Z7952 Long term (current) use of systemic steroids: Secondary | ICD-10-CM | POA: Diagnosis not present

## 2020-07-09 DIAGNOSIS — Z823 Family history of stroke: Secondary | ICD-10-CM | POA: Diagnosis not present

## 2020-07-09 DIAGNOSIS — H04129 Dry eye syndrome of unspecified lacrimal gland: Secondary | ICD-10-CM | POA: Diagnosis not present

## 2020-07-12 DIAGNOSIS — H35033 Hypertensive retinopathy, bilateral: Secondary | ICD-10-CM | POA: Diagnosis not present

## 2020-07-12 DIAGNOSIS — Z961 Presence of intraocular lens: Secondary | ICD-10-CM | POA: Diagnosis not present

## 2020-07-12 DIAGNOSIS — H30033 Focal chorioretinal inflammation, peripheral, bilateral: Secondary | ICD-10-CM | POA: Diagnosis not present

## 2020-07-22 DIAGNOSIS — L818 Other specified disorders of pigmentation: Secondary | ICD-10-CM | POA: Diagnosis not present

## 2020-07-22 DIAGNOSIS — D485 Neoplasm of uncertain behavior of skin: Secondary | ICD-10-CM | POA: Diagnosis not present

## 2020-07-29 ENCOUNTER — Ambulatory Visit (INDEPENDENT_AMBULATORY_CARE_PROVIDER_SITE_OTHER): Payer: Medicare PPO | Admitting: Internal Medicine

## 2020-07-29 ENCOUNTER — Encounter: Payer: Self-pay | Admitting: Internal Medicine

## 2020-07-29 ENCOUNTER — Other Ambulatory Visit: Payer: Self-pay

## 2020-07-29 VITALS — BP 142/82 | HR 73 | Temp 98.5°F | Wt 160.0 lb

## 2020-07-29 DIAGNOSIS — L83 Acanthosis nigricans: Secondary | ICD-10-CM | POA: Insufficient documentation

## 2020-07-29 DIAGNOSIS — E785 Hyperlipidemia, unspecified: Secondary | ICD-10-CM

## 2020-07-29 DIAGNOSIS — Z23 Encounter for immunization: Secondary | ICD-10-CM | POA: Diagnosis not present

## 2020-07-29 DIAGNOSIS — E538 Deficiency of other specified B group vitamins: Secondary | ICD-10-CM | POA: Diagnosis not present

## 2020-07-29 DIAGNOSIS — H30049 Focal chorioretinal inflammation, macular or paramacular, unspecified eye: Secondary | ICD-10-CM | POA: Diagnosis not present

## 2020-07-29 DIAGNOSIS — I10 Essential (primary) hypertension: Secondary | ICD-10-CM | POA: Diagnosis not present

## 2020-07-29 NOTE — Assessment & Plan Note (Signed)
Neck S/p derm consult, bx

## 2020-07-29 NOTE — Progress Notes (Signed)
Subjective:  Patient ID: Joann Fields, female    DOB: [birth year]  Age: 69 y.o. MRN: [id number]  CC: Follow-up (6 month f/u- Pt is wanting LIPID & BMET)   HPI Joann Fields presents for her eye condition F/u HTN On Prednisone 10 mg bid and Azathioprine     Per Joann Fields: "1. Peripheral focal chorioretinal inflammation of both eyes  2. Hypertensive retinopathy of both eyes  3. Pseudophakia of both eyes   1. Peripheral focal chorioretinal inflammation of both eyes - I am in full agreement with Joann Fields Fields  - Patient shows signs of active inflammation on ocular exam on 07/02/20 - I have discussed the vision threatening nature/potentially blinding disease which requires chemotherapy/immune suppression. I have discussed in great detail the medication along with reviewing all the systemic implications.  - High risk labs drawn on 07/02/2020 - HLA-B44,B53 positive and ANA positive - Switch from Prolensa qhs OU to Bromfenac BID OU - I recommend starting long-term IMT such as Azathioprine. I have discussed side effects and patient consents to proceed on 07/12/20 Imuran Immunosuppression side effects: Liver failure/ toxicity, neuropathy, mild renal impairment, bone marrow suppression, secondary infections, and lymphoproliferative disorders.  - Start Azathioprine 50 mg daily today 07/12/20 - Currently on PF TID OU - Continue Bromfenac BID OU - Follow up in 9 weeks for reevaluation   2. Hypertensive retinopathy of both eyes - Mild. I have recommended continued optimization of blood pressure with his primary care physician.   3. Pseudophakia of both eyes - Stable "    =====  Joann Melena, MD   Outpatient Medications Prior to Visit  Medication Sig Dispense Refill  . azaTHIOprine (IMURAN) 50 MG tablet Take by mouth.    . Calcium-Vitamin D-Vitamin K [phone] MG-UNT-MCG TABS Take 1 Dose by mouth in the morning and at bedtime. Chocolate chewable    . Cyanocobalamin  (VITAMIN B-12) 1000 MCG SUBL PLACE 1 TABLET UNDER THE TONGUE DAILY. 100 tablet 3  . diltiazem (CARDIZEM CD) 180 MG 24 hr capsule TAKE 1 CAPSULE BY MOUTH EVERY DAY 90 capsule 3  . predniSONE (DELTASONE) 10 MG tablet Take by mouth.    . nepafenac (ILEVRO) 0.3 % ophthalmic suspension Apply to eye.    Marland Kitchen CALCIUM-VITAMIN D PO Take 1 capsule by mouth daily. Takes 600 mg (Patient not taking: Reported on 07/29/2020)    . DUREZOL 0.05 % EMUL Place 1 drop into the left eye 3 (three) times daily.    . fluticasone (FLONASE) 50 MCG/ACT nasal spray Place 2 sprays into both nostrils daily. (Patient not taking: Reported on 02/09/2020) 16 g 6  . nepafenac (ILEVRO) 0.3 % ophthalmic suspension Place 1 drop into both eyes daily. (Patient not taking: Reported on 07/29/2020)    . NONFORMULARY OR COMPOUNDED ITEM Antifungal solution: Terbinafine 3%, Fluconazole 2%, Tea Tree Oil 5%, Urea 10%, Ibuprofen 2% in DMSO suspension #2m (Patient not taking: Reported on 02/09/2020) 1 each 3  . oxybutynin (DITROPAN) 5 MG tablet TAKE 1 TABLET (5 MG TOTAL) BY MOUTH AT BEDTIME AS NEEDED FOR BLADDER SPASMS. (Patient not taking: Reported on 07/29/2020) 90 tablet 1  . ranitidine (ZANTAC) 150 MG tablet TAKE 1 TABLET (150 MG TOTAL) BY MOUTH 2 (TWO) TIMES DAILY. (Patient not taking: Reported on 01/25/2020) 60 tablet 2   No facility-administered medications prior to visit.    ROS: Review of Systems  Constitutional: Negative for activity change, appetite change, chills, fatigue and unexpected weight change.  HENT: Negative for congestion,  mouth sores and sinus pressure.   Eyes: Positive for redness and visual disturbance.  Respiratory: Negative for cough and chest tightness.   Gastrointestinal: Negative for abdominal pain and nausea.  Genitourinary: Negative for difficulty urinating, frequency and vaginal pain.  Musculoskeletal: Negative for back pain and gait problem.  Skin: Negative for pallor and rash.  Neurological: Negative for dizziness,  tremors, weakness, numbness and headaches.  Psychiatric/Behavioral: Negative for confusion and sleep disturbance.    Objective:  BP (!) 142/82 (BP Location: Left Arm)   Pulse 73   Temp 98.5 F (36.9 C) (Oral)   Wt 160 lb (72.6 kg)   SpO2 96%   BMI 26.63 kg/m   BP Readings from Last 3 Encounters:  07/29/20 (!) 142/82  02/09/20 120/73  01/25/20 (!) 150/90    Wt Readings from Last 3 Encounters:  07/29/20 160 lb (72.6 kg)  02/09/20 162 lb (73.5 kg)  01/26/20 162 lb (73.5 kg)    Physical Exam Constitutional:      General: She is not in acute distress.    Appearance: She is well-developed.  HENT:     Head: Normocephalic.     Right Ear: External ear normal.     Left Ear: External ear normal.     Nose: Nose normal.  Eyes:     General:        Right eye: No discharge.        Left eye: No discharge.     Conjunctiva/sclera: Conjunctivae normal.     Pupils: Pupils are equal, round, and reactive to light.  Neck:     Thyroid: No thyromegaly.     Vascular: No JVD.     Trachea: No tracheal deviation.  Cardiovascular:     Rate and Rhythm: Normal rate and regular rhythm.     Heart sounds: Normal heart sounds.  Pulmonary:     Effort: No respiratory distress.     Breath sounds: No stridor. No wheezing.  Abdominal:     General: Bowel sounds are normal. There is no distension.     Palpations: Abdomen is soft. There is no mass.     Tenderness: There is no abdominal tenderness. There is no guarding or rebound.  Musculoskeletal:        General: No tenderness.     Cervical back: Normal range of motion and neck supple.  Lymphadenopathy:     Cervical: No cervical adenopathy.  Skin:    Findings: No erythema or rash.  Neurological:     Mental Status: She is oriented to person, place, and time.     Cranial Nerves: No cranial nerve deficit.     Motor: No abnormal muscle tone.     Coordination: Coordination normal.     Deep Tendon Reflexes: Reflexes normal.  Psychiatric:         Behavior: Behavior normal.        Thought Content: Thought content normal.        Judgment: Judgment normal.   dark skin on neck  Lab Results  Component Value Date   WBC 7.7 01/26/2020   HGB 13.2 01/26/2020   HCT 39.4 01/26/2020   PLT 264.0 01/26/2020   GLUCOSE 98 01/26/2020   CHOL 234 (H) 01/26/2020   TRIG 160.0 (H) 01/26/2020   HDL 47.50 01/26/2020   LDLDIRECT 165.3 09/21/2013   LDLCALC 154 (H) 01/26/2020   ALT 11 01/26/2020   AST 13 01/26/2020   NA 140 01/26/2020   K 3.9 01/26/2020   CL 106  01/26/2020   CREATININE 1.11 01/26/2020   BUN 14 01/26/2020   CO2 29 01/26/2020   TSH 1.36 01/26/2020   HGBA1C 5.5 10/23/2016    CT CARDIAC SCORING  Addendum Date: 03/06/2020   ADDENDUM REPORT: 03/06/2020 15:39 CLINICAL DATA:  Risk stratification EXAM: Coronary Calcium Score TECHNIQUE: The patient was scanned on a [employer] scanner. Axial non-contrast 3 mm slices were carried out through the heart. The data set was analyzed on a dedicated work station and scored using the [place]. FINDINGS: Non-cardiac: See separate report from [facility]. Ascending Aorta: Normal size, no calcifications. Pericardium: Normal. Coronary arteries: Normal origin. IMPRESSION: Coronary calcium score of 0. This was 0 percentile for age and sex matched control. Electronically Signed   By: Ena Dawley   On: 03/06/2020 15:39   Result Date: 03/06/2020 EXAM: OVER-READ INTERPRETATION  CT CHEST The following report is an over-read performed by radiologist Joann. Vinnie Langton of [facility], [place] on 03/06/2020. This over-read does not include interpretation of cardiac or coronary anatomy or pathology. The coronary calcium score interpretation by the cardiologist is attached. COMPARISON:  None. FINDINGS: Within the visualized portions of the thorax there are no suspicious appearing pulmonary nodules or masses, there is no acute consolidative airspace disease, no pleural effusions, no  pneumothorax and no lymphadenopathy. Visualized portions of the upper abdomen are unremarkable. There are no aggressive appearing lytic or blastic lesions noted in the visualized portions of the skeleton. IMPRESSION: No significant incidental noncardiac findings are noted. Electronically Signed: By: Vinnie Langton M.D. On: 03/06/2020 13:24    Fields & Plan:    Walker Kehr, MD

## 2020-07-29 NOTE — Assessment & Plan Note (Signed)
Labs

## 2020-07-29 NOTE — Assessment & Plan Note (Addendum)
2021 Dr Manuella Ghazi -  1. Peripheral focal chorioretinal inflammation of both eyes  2. Hypertensive retinopathy of both eyes  3. Pseudophakia of both eyes   1. Peripheral focal chorioretinal inflammation of both eyes - I am in full agreement with Dr. Merry Proud assessment  - Patient shows signs of active inflammation on ocular exam on 07/02/20 - I have discussed the vision threatening nature/potentially blinding disease which requires chemotherapy/immune suppression. I have discussed in great detail the medication along with reviewing all the systemic implications.  - High risk labs drawn on 07/02/2020 - HLA-B44,B53 positive and ANA positive - Switch from Prolensa qhs OU to Bromfenac BID OU - I recommend starting long-term IMT such as Azathioprine. I have discussed side effects and patient consents to proceed on 07/12/20 Imuran Immunosuppression side effects: Liver failure/ toxicity, neuropathy, mild renal impairment, bone marrow suppression, secondary infections, and lymphoproliferative disorders.  - Start Azathioprine 50 mg daily today 07/12/20 - Currently on PF TID OU - Continue Bromfenac BID OU - Follow up in 9 weeks for reevaluation   2. Hypertensive retinopathy of both eyes - Mild. I have recommended continued optimization of blood pressure with his primary care physician.   3. Pseudophakia of both eyes - Stable     =====  Dwana Melena, MD    On Prednisone 10 mg bid and Azathioprine

## 2020-07-29 NOTE — Assessment & Plan Note (Signed)
On B12 

## 2020-07-29 NOTE — Assessment & Plan Note (Signed)
Diltiazem

## 2020-08-05 DIAGNOSIS — L819 Disorder of pigmentation, unspecified: Secondary | ICD-10-CM | POA: Diagnosis not present

## 2020-08-05 DIAGNOSIS — L309 Dermatitis, unspecified: Secondary | ICD-10-CM | POA: Diagnosis not present

## 2020-08-07 DIAGNOSIS — H20013 Primary iridocyclitis, bilateral: Secondary | ICD-10-CM | POA: Diagnosis not present

## 2020-08-07 DIAGNOSIS — H524 Presbyopia: Secondary | ICD-10-CM | POA: Diagnosis not present

## 2020-08-07 DIAGNOSIS — Z961 Presence of intraocular lens: Secondary | ICD-10-CM | POA: Diagnosis not present

## 2020-08-07 DIAGNOSIS — H5201 Hypermetropia, right eye: Secondary | ICD-10-CM | POA: Diagnosis not present

## 2020-08-07 DIAGNOSIS — H52223 Regular astigmatism, bilateral: Secondary | ICD-10-CM | POA: Diagnosis not present

## 2020-08-07 DIAGNOSIS — H5212 Myopia, left eye: Secondary | ICD-10-CM | POA: Diagnosis not present

## 2020-09-04 ENCOUNTER — Telehealth: Payer: Self-pay | Admitting: Internal Medicine

## 2020-09-04 NOTE — Telephone Encounter (Signed)
LVM for pt to rtn my call to schedule AWV with [facility]. Please schedule this appt if pt calls the office.  °

## 2020-09-10 IMAGING — CT CT CARDIAC CORONARY ARTERY CALCIUM SCORE
3 series · 14 of 20 positions shown, 15 images · non-contrast
Comparison: None.
COMPARISON: None.

Addendum:
EXAM:
OVER-READ INTERPRETATION  CT CHEST

The following report is an over-read performed by radiologist Dr.
Wojtek Gavarrete [REDACTED] on 03/06/2020. This
over-read does not include interpretation of cardiac or coronary
anatomy or pathology. The coronary calcium score interpretation by
the cardiologist is attached.
CLINICAL DATA: Risk stratification
Coronary Calcium Score
TECHNIQUE: The patient was scanned on a Siem[employer] Force scanner. Axial
non-contrast 3 mm slices were carried out through the heart. The
data set was analyzed on a dedicated work station and scored using
the Agatson method.

[Series 2: casc 3.0 bv41 2 bestdiast 72 % · axial · 0.34mm/px · z∈[-187,-106]mm · 4 of 45 slices shown, 5 images]
[im 9/45  vessel]
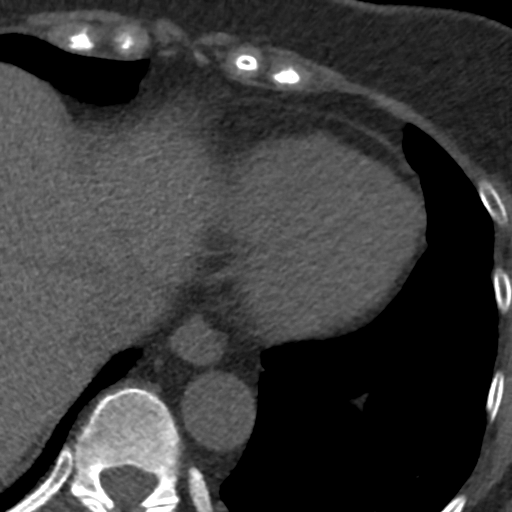
[im 9/45  lung]
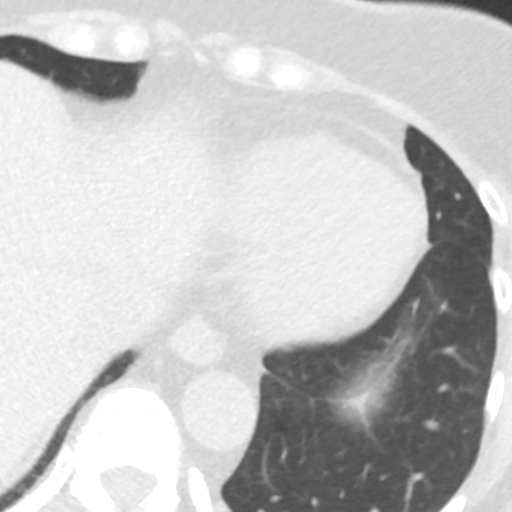
[im 18/45  vessel]
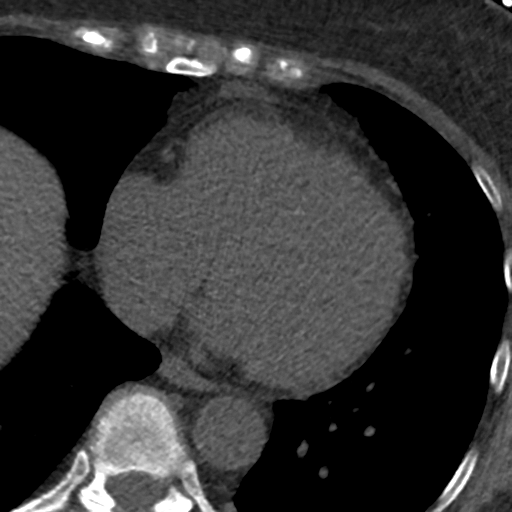
[im 27/45  vessel]
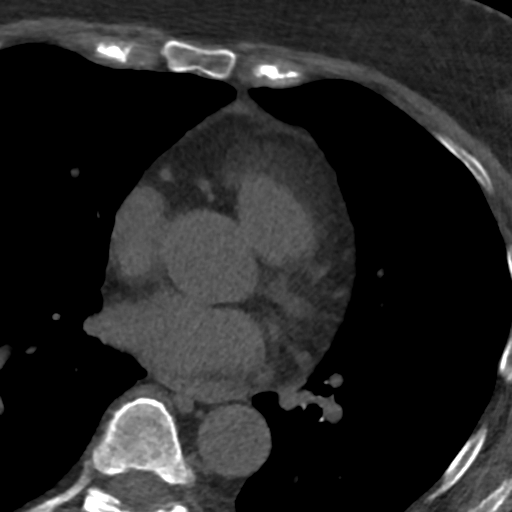
[im 36/45  vessel]
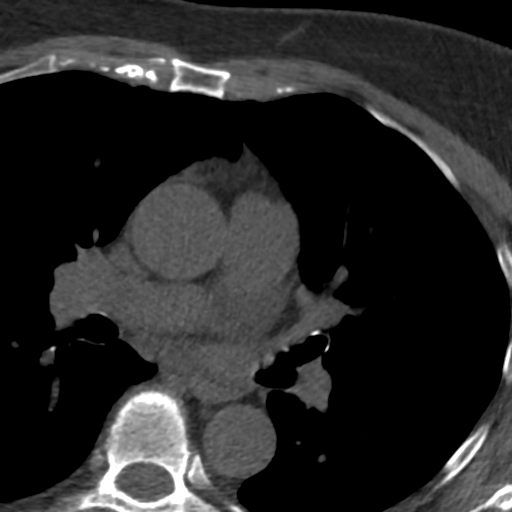

[Series 3: lung 73 % · axial · 0.68mm/px · z∈[-190,-103]mm · 5 of 45 slices shown]
[im 8/45  lung]
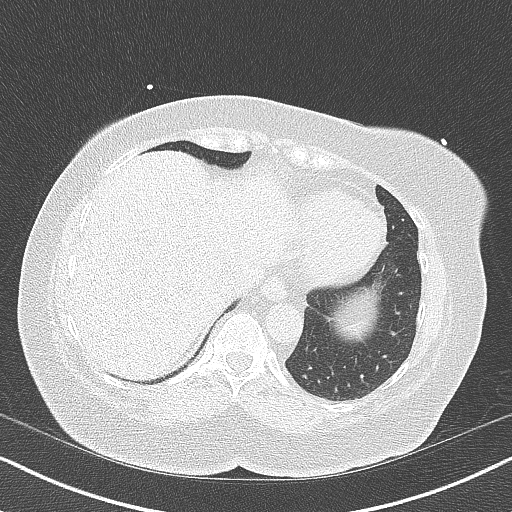
[im 15/45  lung]
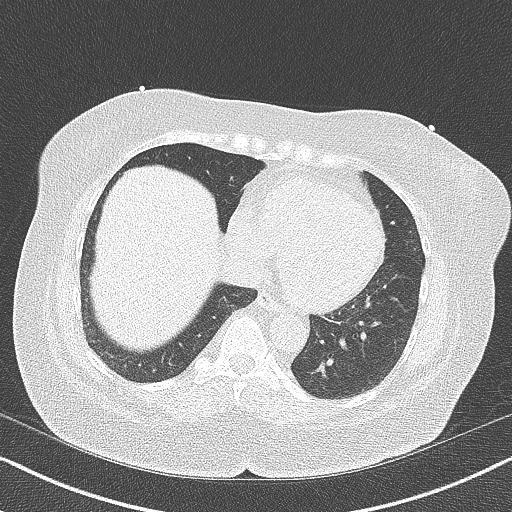
[im 23/45  lung]
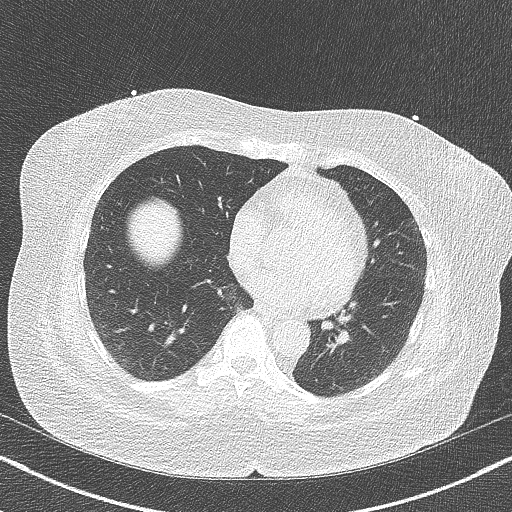
[im 30/45  lung]
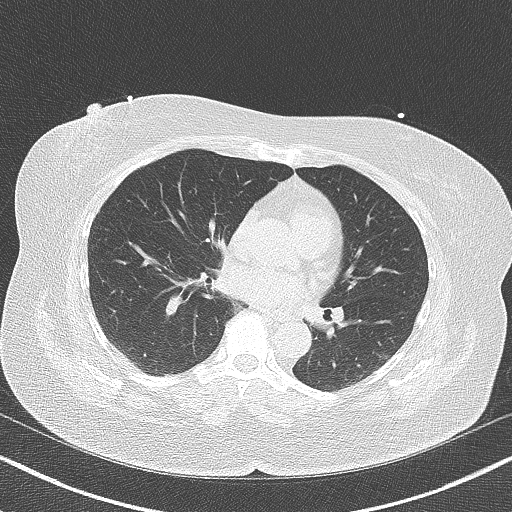
[im 37/45  lung]
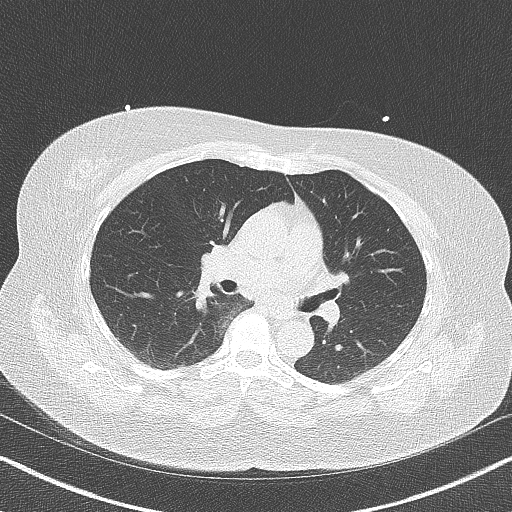

[Series 4: lung st 73 % · axial · 0.68mm/px · z∈[-190,-103]mm · 5 of 45 slices shown]
[im 8/45  lung]
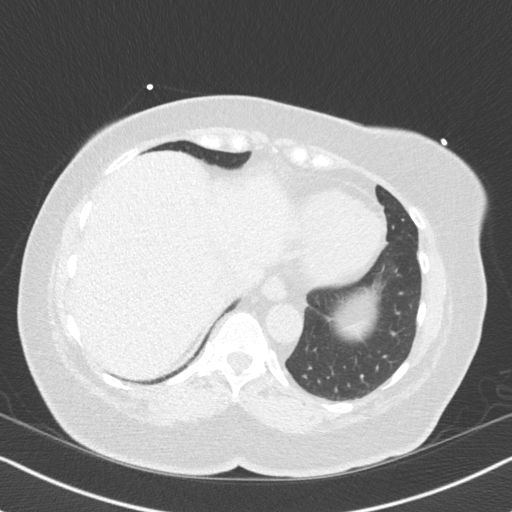
[im 15/45  lung]
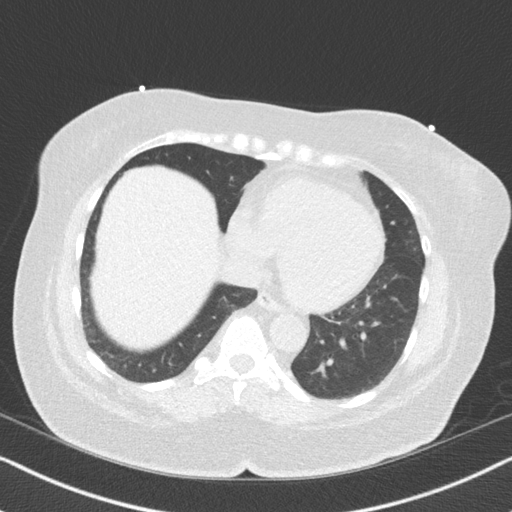
[im 23/45  lung]
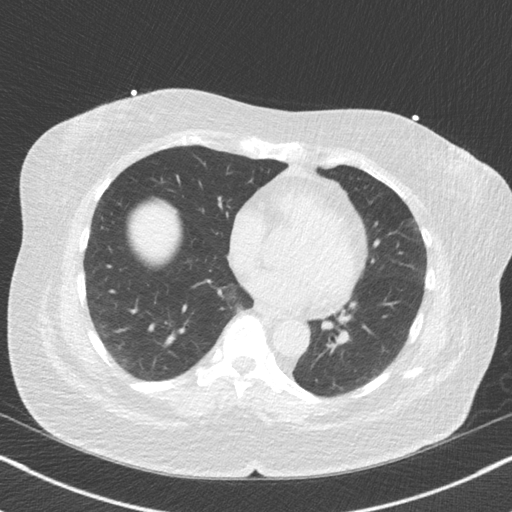
[im 30/45  lung]
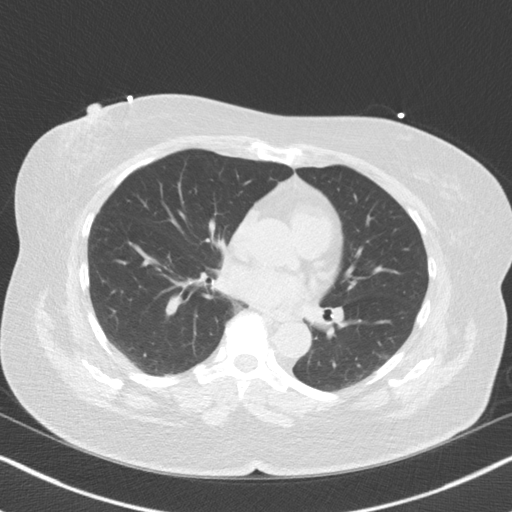
[im 37/45  lung]
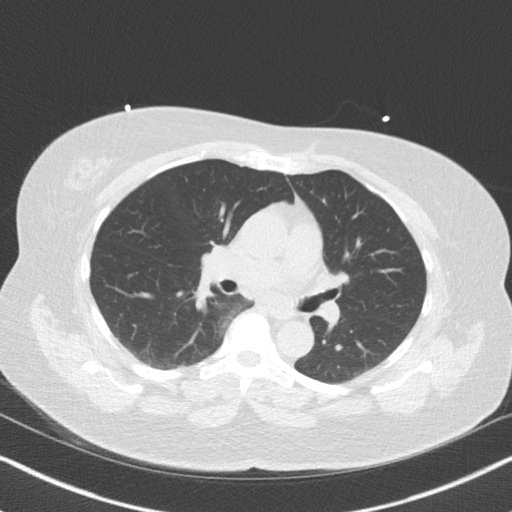

[14 of 20 positions shown; findings below may reference images not displayed]

FINDINGS: Within the visualized portions of the thorax there are no suspicious
appearing pulmonary nodules or masses, there is no acute
consolidative airspace disease, no pleural effusions, no
pneumothorax and no lymphadenopathy. Visualized portions of the
upper abdomen are unremarkable. There are no aggressive appearing
lytic or blastic lesions noted in the visualized portions of the
skeleton.
IMPRESSION: No significant incidental noncardiac findings are noted.
FINDINGS: Non-cardiac: See separate report from [REDACTED].

Ascending Aorta: Normal size, no calcifications.

Pericardium: Normal.

Coronary arteries: Normal origin.
IMPRESSION: Coronary calcium score of 0. This was 0 percentile for age and sex
matched control.

*** End of Addendum ***
EXAM:
OVER-READ INTERPRETATION  CT CHEST

The following report is an over-read performed by radiologist Dr.
Wojtek Gavarrete [REDACTED] on 03/06/2020. This
over-read does not include interpretation of cardiac or coronary
anatomy or pathology. The coronary calcium score interpretation by
the cardiologist is attached.
FINDINGS: Within the visualized portions of the thorax there are no suspicious
appearing pulmonary nodules or masses, there is no acute
consolidative airspace disease, no pleural effusions, no
pneumothorax and no lymphadenopathy. Visualized portions of the
upper abdomen are unremarkable. There are no aggressive appearing
lytic or blastic lesions noted in the visualized portions of the
skeleton.
IMPRESSION: No significant incidental noncardiac findings are noted.

## 2020-09-13 ENCOUNTER — Ambulatory Visit: Payer: Medicare PPO

## 2020-09-20 ENCOUNTER — Other Ambulatory Visit: Payer: Self-pay

## 2020-09-20 ENCOUNTER — Ambulatory Visit (INDEPENDENT_AMBULATORY_CARE_PROVIDER_SITE_OTHER): Payer: Medicare PPO

## 2020-09-20 VITALS — BP 140/80 | HR 80 | Temp 98.4°F | Resp 16 | Ht 65.0 in | Wt 154.6 lb

## 2020-09-20 DIAGNOSIS — Z0000 Encounter for general adult medical examination without abnormal findings: Secondary | ICD-10-CM

## 2020-09-20 NOTE — Progress Notes (Signed)
Subjective:   Joann Fields is a 70 y.o. female who presents for Medicare Annual (Subsequent) preventive examination.  Review of Systems    No ROS. Medicare Wellness Visit. Additional risk factors are reflected in social history. Cardiac Risk Factors include: advanced age (>63men, >59 women);dyslipidemia;family history of premature cardiovascular disease;hypertension     Objective:    Today's Vitals   09/20/20 1056  BP: 140/80  Pulse: 80  Resp: 16  Temp: 98.4 F (36.9 C)  SpO2: 97%  Weight: 154 lb 9.6 oz (70.1 kg)  Height: 5\' 5"  (1.651 m)  PainSc: 0-No pain   Body mass index is 25.73 kg/m.  Advanced Directives 09/20/2020 01/05/2019 11/08/2017 12/09/2016 11/25/2016 10/14/2016  Does Patient Have a Medical Advance Directive? No No No No No No  Does patient want to make changes to medical advance directive? - Yes (ED - Information included in AVS) - - - -  Would patient like information on creating a medical advance directive? No - Patient declined - Yes (ED - Information included in AVS) - - No - Patient declined    Current Medications (verified) Outpatient Encounter Medications as of 09/20/2020  Medication Sig  . azaTHIOprine (IMURAN) 50 MG tablet Take 25 mg by mouth.  . Calcium-Vitamin D-Vitamin K [phone] MG-UNT-MCG TABS Take 1 Dose by mouth in the morning and at bedtime. Chocolate chewable  . Cyanocobalamin (VITAMIN B-12) 1000 MCG SUBL PLACE 1 TABLET UNDER THE TONGUE DAILY.  Marland Kitchen diltiazem (CARDIZEM CD) 180 MG 24 hr capsule TAKE 1 CAPSULE BY MOUTH EVERY DAY  . predniSONE (DELTASONE) 10 MG tablet Take by mouth.  . nepafenac (ILEVRO) 0.3 % ophthalmic suspension Apply to eye. (Patient not taking: Reported on 09/20/2020)   No facility-administered encounter medications on file as of 09/20/2020.    Allergies (verified) Amoxicillin   History: Past Medical History:  Diagnosis Date  . Cataract    removed 2021  . GERD (gastroesophageal reflux disease)   . Hyperlipidemia    . Hypertension   . Osteopenia    Past Surgical History:  Procedure Laterality Date  . CATARACT EXTRACTION, BILATERAL Bilateral 2021   feb- right march-left  . COLONOSCOPY  2018  . DG THUMB LEFT HAND     cut her hand while prepping chicken for supper  . POLYPECTOMY    . SVD x 2    . WISDOM TOOTH EXTRACTION     Family History  Problem Relation Age of Onset  . Lung cancer Mother 69  . Heart disease Father 46       MI  . Alzheimer's disease Brother 58  . Early death Maternal Aunt   . Hearing loss Maternal Aunt   . Lymphoma Sister   . Colon cancer Neg Hx   . Colon polyps Neg Hx   . Esophageal cancer Neg Hx   . Rectal cancer Neg Hx   . Stomach cancer Neg Hx    Social History   Socioeconomic History  . Marital status: Single    Spouse name: Not on file  . Number of children: 2  . Years of education: Not on file  . Highest education level: Not on file  Occupational History  . Occupation: Retired  Tobacco Use  . Smoking status: Never Smoker  . Smokeless tobacco: Never Used  . Tobacco comment: only in [employer] Use  . Vaping Use: Never used  Substance and Sexual Activity  . Alcohol use: Yes    Alcohol/week: 0.0 standard drinks  Comment: Rarely; once yearly  . Drug use: No  . Sexual activity: Not Currently  Other Topics Concern  . Not on file  Social History Narrative  . Not on file   Social Determinants of Health   Financial Resource Strain: Not on file  Food Insecurity: Not on file  Transportation Needs: No Transportation Needs  . Lack of Transportation (Medical): No  . Lack of Transportation (Non-Medical): No  Physical Activity: Sufficiently Active  . Days of Exercise per Week: 5 days  . Minutes of Exercise per Session: 30 min  Stress: Not on file  Social Connections: Moderately Integrated  . Frequency of Communication with Friends and Family: More than three times a week  . Frequency of Social Gatherings with Friends and Family: More than three  times a week  . Attends Religious Services: More than 4 times per year  . Active Member of Clubs or Organizations: Yes  . Attends Archivist Meetings: More than 4 times per year  . Marital Status: Never married    Tobacco Counseling Counseling given: Not Answered Comment: only in College   Clinical Intake:  Pre-visit preparation completed: Yes  Pain : No/denies pain Pain Score: 0-No pain     BMI - recorded: 25.73 Nutritional Status: BMI 25 -29 Overweight Nutritional Risks: None Diabetes: No  How often do you need to have someone help you when you read instructions, pamphlets, or other written materials from your doctor or pharmacy?: 1 - Never What is the last grade level you completed in school?: Master's Degree from  [employer]  Diabetic? no  Interpreter Needed?: No  Information entered by :: Lisette Abu, LPN   Activities of Daily Living In your present state of health, do you have any difficulty performing the following activities: 09/20/2020  Hearing? N  Vision? N  Difficulty concentrating or making decisions? N  Walking or climbing stairs? N  Dressing or bathing? N  Doing errands, shopping? N  Preparing Food and eating ? N  Using the Toilet? N  In the past six months, have you accidently leaked urine? N  Do you have problems with loss of bowel control? N  Managing your Medications? N  Managing your Finances? N  Housekeeping or managing your Housekeeping? N  Some recent data might be hidden    Patient Care Team: Plotnikov, Evie Lacks, MD as PCP - General Brien Few, MD as Attending Physician (Obstetrics and Gynecology) Milus Banister, MD as Attending Physician (Gastroenterology) Darleen Crocker, MD as Consulting Physician (Ophthalmology) Feliz Beam, MD as Referring Physician (Ophthalmology)  Indicate any recent Medical Services you may have received from other than [facility] providers in the past year (date may be approximate).      Assessment:   This is a routine wellness examination for Joann Fields.  Hearing/Vision screen No exam data present  Dietary issues and exercise activities discussed: Current Exercise Habits: Home exercise routine, Type of exercise: walking;treadmill;stretching;strength training/weights, Time (Minutes): 30, Frequency (Times/Week): 5, Weekly Exercise (Minutes/Week): 150, Intensity: Moderate, Exercise limited by: None identified  Goals    . Patient Stated     Increase the amount of water I drink daily. I will start to set out 3 bottles of water daily and drink them.    . Patient Stated     My goal for this year is to contact a Chief Executive Officer and have my Living Will and Power of [place] completed.      Depression Screen [facility] 2/9 Scores 09/20/2020 01/05/2019 11/08/2017 10/14/2016  11/14/2014  PHQ - 2 Score 0 0 0 0 0    Fall Risk Fall Risk  09/20/2020 01/05/2019 11/08/2017 10/14/2016 11/14/2014  Falls in the past year? 0 0 No No Yes  Number falls in past yr: 0 0 - - 1  Injury with Fall? 0 - - - No  Risk for fall due to : No Fall Risks - - - -    FALL RISK PREVENTION PERTAINING TO THE HOME:  Any stairs in or around the home? Yes  If so, are there any without handrails? No  Home free of loose throw rugs in walkways, pet beds, electrical cords, etc? Yes  Adequate lighting in your home to reduce risk of falls? Yes   ASSISTIVE DEVICES UTILIZED TO PREVENT FALLS:  Life alert? No  Use of a cane, walker or w/c? No  Grab bars in the bathroom? No  Shower chair or bench in shower? No  Elevated toilet seat or a handicapped toilet? No   TIMED UP AND GO:  Was the test performed? No .  Length of time to ambulate 10 feet: 0 sec.   Gait steady and fast without use of assistive device  Cognitive Function: Normal cognitive status assessed by direct observation by this Nurse Health Advisor. No abnormalities found.         Immunizations Immunization History  Administered Date(s) Administered  . Influenza,  High Dose Seasonal PF 10/14/2016, 11/08/2017, 06/29/2018, 07/08/2020  . Influenza, Quadrivalent, Recombinant, Inj, Pf 06/23/2019  . Influenza,inj,Quad PF,6+ Mos 09/12/2013, 09/24/2014, 09/27/2015  . PFIZER(Purple Top)SARS-COV-2 Vaccination 09/29/2019, 10/20/2019, 06/27/2020  . Pneumococcal Conjugate-13 10/14/2016  . Pneumococcal Polysaccharide-23 11/08/2017  . Td 05/14/2010  . Tdap 07/29/2020  . Zoster 08/08/2012    TDAP status: Up to date  Flu Vaccine status: Up to date  Pneumococcal vaccine status: Up to date  Covid-19 vaccine status: Completed vaccines  Qualifies for Shingles Vaccine? Yes   Zostavax completed Yes   Shingrix Completed?: No.    Education has been provided regarding the importance of this vaccine. Patient has been advised to call insurance company to determine out of pocket expense if they have not yet received this vaccine. Advised may also receive vaccine at local pharmacy or Health Dept. Verbalized acceptance and understanding.  Screening Tests Health Maintenance  Topic Date Due  . MAMMOGRAM  10/28/2019  . COLONOSCOPY (Pts 45-99yrs Insurance coverage will need to be confirmed)  02/09/2023  . TETANUS/TDAP  07/29/2030  . INFLUENZA VACCINE  Completed  . DEXA SCAN  Completed  . COVID-19 Vaccine  Completed  . Hepatitis C Screening  Completed  . PNA vac Low Risk Adult  Completed    Health Maintenance  Health Maintenance Due  Topic Date Due  . MAMMOGRAM  10/28/2019    Colorectal cancer screening: Type of screening: Colonoscopy. Completed 02/09/2020. Repeat every 3 years  Mammogram status: Completed 10/2019. Repeat every year  Bone Density status: Completed 02/27/2019. Results reflect: Bone density results: OSTEOPENIA. Repeat every 2 years.  Lung Cancer Screening: (Low Dose CT Chest recommended if Age 76-80 years, 30 pack-year currently smoking OR have quit w/in 15years.) does not qualify.   Lung Cancer Screening Referral: no  Additional  Screening:  Hepatitis C Screening: does qualify; Completed yes  Vision Screening: Recommended annual ophthalmology exams for early detection of glaucoma and other disorders of the eye. Is the patient up to date with their annual eye exam?  Yes  Who is the provider or what is the name of the  office in which the patient attends annual eye exams? Dwana Melena, MD. If pt is not established with a provider, would they like to be referred to a provider to establish care? No .   Dental Screening: Recommended annual dental exams for proper oral hygiene  Community Resource Referral / Chronic Care Management: CRR required this visit?  No   CCM required this visit?  No      Plan:     I have personally reviewed and noted the following in the patient's chart:   . Medical and social history . Use of alcohol, tobacco or illicit drugs  . Current medications and supplements . Functional ability and status . Nutritional status . Physical activity . Advanced directives . List of other physicians . Hospitalizations, surgeries, and ER visits in previous 12 months . Vitals . Screenings to include cognitive, depression, and falls . Referrals and appointments  In addition, I have reviewed and discussed with patient certain preventive protocols, quality metrics, and best practice recommendations. A written personalized care plan for preventive services as well as general preventive health recommendations were provided to patient.     Sheral Flow, LPN   [id number]   Nurse Notes: n/a

## 2020-09-20 NOTE — Patient Instructions (Addendum)
Ms. Debnam , Thank you for taking time to come for your Medicare Wellness Visit. I appreciate your ongoing commitment to your health goals. Please review the following plan we discussed and let me know if I can assist you in the future.   Screening recommendations/referrals: Colonoscopy: 02/09/2020; due every 3 years Mammogram: 10/2019; due every 1-2 years Bone Density: 02/27/2019; due every 2 years Recommended yearly ophthalmology/optometry visit for glaucoma screening and checkup Recommended yearly dental visit for hygiene and checkup  Vaccinations: Influenza vaccine: 07/08/2020 Pneumococcal vaccine: up to date Tdap vaccine: 07/29/2020; due every 10 years  Shingles vaccine: never done   Covid-19: up to date  Advanced directives: Please bring a copy of your health care power of attorney and living will to the office at your convenience.  Conditions/risks identified: Yes; Reviewed health maintenance screenings with patient today and relevant education, vaccines, and/or referrals were provided. Please continue to do your personal lifestyle choices by: daily care of teeth and gums, regular physical activity (goal should be 5 days a week for 30 minutes), eat a healthy diet, avoid tobacco and drug use, limiting any alcohol intake, taking a low-dose aspirin (if not allergic or have been advised by your provider otherwise) and taking vitamins and minerals as recommended by your provider. Continue doing brain stimulating activities (puzzles, reading, adult coloring books, staying active) to keep memory sharp. Continue to eat heart healthy diet (full of fruits, vegetables, whole grains, lean protein, water--limit salt, fat, and sugar intake) and increase physical activity as tolerated.  Next appointment: Please schedule your next Medicare Wellness Visit with your Nurse Health Advisor in 1 year by calling [phone].  Preventive Care 40 Years and Older, Female Preventive care refers to lifestyle choices  and visits with your health care provider that can promote health and wellness. What does preventive care include?  A yearly physical exam. This is also called an annual well check.  Dental exams once or twice a year.  Routine eye exams. Ask your health care provider how often you should have your eyes checked.  Personal lifestyle choices, including:  Daily care of your teeth and gums.  Regular physical activity.  Eating a healthy diet.  Avoiding tobacco and drug use.  Limiting alcohol use.  Practicing safe sex.  Taking low-dose aspirin every day.  Taking vitamin and mineral supplements as recommended by your health care provider. What happens during an annual well check? The services and screenings done by your health care provider during your annual well check will depend on your age, overall health, lifestyle risk factors, and family history of disease. Counseling  Your health care provider may ask you questions about your:  Alcohol use.  Tobacco use.  Drug use.  Emotional well-being.  Home and relationship well-being.  Sexual activity.  Eating habits.  History of falls.  Memory and ability to understand (cognition).  Work and work Statistician.  Reproductive health. Screening  You may have the following tests or measurements:  Height, weight, and BMI.  Blood pressure.  Lipid and cholesterol levels. These may be checked every 5 years, or more frequently if you are over 58 years old.  Skin check.  Lung cancer screening. You may have this screening every year starting at age 93 if you have a 30-pack-year history of smoking and currently smoke or have quit within the past 15 years.  Fecal occult blood test (FOBT) of the stool. You may have this test every year starting at age 63.  Flexible sigmoidoscopy or  colonoscopy. You may have a sigmoidoscopy every 5 years or a colonoscopy every 10 years starting at age 40.  Hepatitis C blood test.  Hepatitis  B blood test.  Sexually transmitted disease (STD) testing.  Diabetes screening. This is done by checking your blood sugar (glucose) after you have not eaten for a while (fasting). You may have this done every 1-3 years.  Bone density scan. This is done to screen for osteoporosis. You may have this done starting at age 61.  Mammogram. This may be done every 1-2 years. Talk to your health care provider about how often you should have regular mammograms. Talk with your health care provider about your test results, treatment options, and if necessary, the need for more tests. Vaccines  Your health care provider may recommend certain vaccines, such as:  Influenza vaccine. This is recommended every year.  Tetanus, diphtheria, and acellular pertussis (Tdap, Td) vaccine. You may need a Td booster every 10 years.  Zoster vaccine. You may need this after age 38.  Pneumococcal 13-valent conjugate (PCV13) vaccine. One dose is recommended after age 30.  Pneumococcal polysaccharide (PPSV23) vaccine. One dose is recommended after age 2. Talk to your health care provider about which screenings and vaccines you need and how often you need them. This information is not intended to replace advice given to you by your health care provider. Make sure you discuss any questions you have with your health care provider. Document Released: 09/06/2015 Document Revised: 04/29/2016 Document Reviewed: 06/11/2015 Elsevier Interactive Patient Education  2017 [employer] Prevention in the Home Falls can cause injuries. They can happen to people of all ages. There are many things you can do to make your home safe and to help prevent falls. What can I do on the outside of my home?  Regularly fix the edges of walkways and driveways and fix any cracks.  Remove anything that might make you trip as you walk through a door, such as a raised step or threshold.  Trim any bushes or trees on the path to your  home.  Use bright outdoor lighting.  Clear any walking paths of anything that might make someone trip, such as rocks or tools.  Regularly check to see if handrails are loose or broken. Make sure that both sides of any steps have handrails.  Any raised decks and porches should have guardrails on the edges.  Have any leaves, snow, or ice cleared regularly.  Use sand or salt on walking paths during winter.  Clean up any spills in your garage right away. This includes oil or grease spills. What can I do in the bathroom?  Use night lights.  Install grab bars by the toilet and in the tub and shower. Do not use towel bars as grab bars.  Use non-skid mats or decals in the tub or shower.  If you need to sit down in the shower, use a plastic, non-slip stool.  Keep the floor dry. Clean up any water that spills on the floor as soon as it happens.  Remove soap buildup in the tub or shower regularly.  Attach bath mats securely with double-sided non-slip rug tape.  Do not have throw rugs and other things on the floor that can make you trip. What can I do in the bedroom?  Use night lights.  Make sure that you have a light by your bed that is easy to reach.  Do not use any sheets or blankets that are too  big for your bed. They should not hang down onto the floor.  Have a firm chair that has side arms. You can use this for support while you get dressed.  Do not have throw rugs and other things on the floor that can make you trip. What can I do in the kitchen?  Clean up any spills right away.  Avoid walking on wet floors.  Keep items that you use a lot in easy-to-reach places.  If you need to reach something above you, use a strong step stool that has a grab bar.  Keep electrical cords out of the way.  Do not use floor polish or wax that makes floors slippery. If you must use wax, use non-skid floor wax.  Do not have throw rugs and other things on the floor that can make you  trip. What can I do with my stairs?  Do not leave any items on the stairs.  Make sure that there are handrails on both sides of the stairs and use them. Fix handrails that are broken or loose. Make sure that handrails are as long as the stairways.  Check any carpeting to make sure that it is firmly attached to the stairs. Fix any carpet that is loose or worn.  Avoid having throw rugs at the top or bottom of the stairs. If you do have throw rugs, attach them to the floor with carpet tape.  Make sure that you have a light switch at the top of the stairs and the bottom of the stairs. If you do not have them, ask someone to add them for you. What else can I do to help prevent falls?  Wear shoes that:  Do not have high heels.  Have rubber bottoms.  Are comfortable and fit you well.  Are closed at the toe. Do not wear sandals.  If you use a stepladder:  Make sure that it is fully opened. Do not climb a closed stepladder.  Make sure that both sides of the stepladder are locked into place.  Ask someone to hold it for you, if possible.  Clearly mark and make sure that you can see:  Any grab bars or handrails.  First and last steps.  Where the edge of each step is.  Use tools that help you move around (mobility aids) if they are needed. These include:  Canes.  Walkers.  Scooters.  Crutches.  Turn on the lights when you go into a dark area. Replace any light bulbs as soon as they burn out.  Set up your furniture so you have a clear path. Avoid moving your furniture around.  If any of your floors are uneven, fix them.  If there are any pets around you, be aware of where they are.  Review your medicines with your doctor. Some medicines can make you feel dizzy. This can increase your chance of falling. Ask your doctor what other things that you can do to help prevent falls. This information is not intended to replace advice given to you by your health care provider. Make  sure you discuss any questions you have with your health care provider. Document Released: 06/06/2009 Document Revised: 01/16/2016 Document Reviewed: 09/14/2014 Elsevier Interactive Patient Education  2017 [employer].

## 2020-10-08 DIAGNOSIS — Z79899 Other long term (current) drug therapy: Secondary | ICD-10-CM | POA: Diagnosis not present

## 2020-10-08 DIAGNOSIS — Z961 Presence of intraocular lens: Secondary | ICD-10-CM | POA: Diagnosis not present

## 2020-10-08 DIAGNOSIS — H35033 Hypertensive retinopathy, bilateral: Secondary | ICD-10-CM | POA: Diagnosis not present

## 2020-10-08 DIAGNOSIS — H30033 Focal chorioretinal inflammation, peripheral, bilateral: Secondary | ICD-10-CM | POA: Diagnosis not present

## 2020-10-08 DIAGNOSIS — I10 Essential (primary) hypertension: Secondary | ICD-10-CM | POA: Diagnosis not present

## 2020-10-09 DIAGNOSIS — H30033 Focal chorioretinal inflammation, peripheral, bilateral: Secondary | ICD-10-CM | POA: Diagnosis not present

## 2020-10-17 ENCOUNTER — Encounter: Payer: Self-pay | Admitting: Internal Medicine

## 2020-10-17 ENCOUNTER — Other Ambulatory Visit: Payer: Self-pay

## 2020-10-17 ENCOUNTER — Ambulatory Visit (INDEPENDENT_AMBULATORY_CARE_PROVIDER_SITE_OTHER): Payer: Medicare PPO | Admitting: Internal Medicine

## 2020-10-17 DIAGNOSIS — R1010 Upper abdominal pain, unspecified: Secondary | ICD-10-CM | POA: Diagnosis not present

## 2020-10-17 DIAGNOSIS — R12 Heartburn: Secondary | ICD-10-CM | POA: Diagnosis not present

## 2020-10-17 DIAGNOSIS — H30049 Focal chorioretinal inflammation, macular or paramacular, unspecified eye: Secondary | ICD-10-CM

## 2020-10-17 DIAGNOSIS — R109 Unspecified abdominal pain: Secondary | ICD-10-CM | POA: Insufficient documentation

## 2020-10-17 DIAGNOSIS — I10 Essential (primary) hypertension: Secondary | ICD-10-CM

## 2020-10-17 LAB — COMPREHENSIVE METABOLIC PANEL
ALT: 19 U/L (ref 0–35)
AST: 13 U/L (ref 0–37)
Albumin: 4.2 g/dL (ref 3.5–5.2)
Alkaline Phosphatase: 50 U/L (ref 39–117)
BUN: 18 mg/dL (ref 6–23)
CO2: 32 mEq/L (ref 19–32)
Calcium: 12 mg/dL — ABNORMAL HIGH (ref 8.4–10.5)
Chloride: 101 mEq/L (ref 96–112)
Creatinine, Ser: 1.42 mg/dL — ABNORMAL HIGH (ref 0.40–1.20)
GFR: 37.15 mL/min — ABNORMAL LOW (ref >60.00)
Glucose, Bld: 121 mg/dL — ABNORMAL HIGH (ref 70–99)
Potassium: 4.1 mEq/L (ref 3.5–5.1)
Sodium: 139 mEq/L (ref 135–145)
Total Bilirubin: 0.6 mg/dL (ref 0.2–1.2)
Total Protein: 7.3 g/dL (ref 6.0–8.3)

## 2020-10-17 LAB — CBC WITH DIFFERENTIAL/PLATELET
Basophils Absolute: 0.1 10*3/uL (ref 0.0–0.1)
Basophils Relative: 1 % (ref 0.0–3.0)
Eosinophils Absolute: 0 10*3/uL (ref 0.0–0.7)
Eosinophils Relative: 0 % (ref 0.0–5.0)
HCT: 42.4 % (ref 36.0–46.0)
Hemoglobin: 14.3 g/dL (ref 12.0–15.0)
Lymphocytes Relative: 7.8 % — ABNORMAL LOW (ref 12.0–46.0)
Lymphs Abs: 0.9 10*3/uL (ref 0.7–4.0)
MCHC: 33.7 g/dL (ref 30.0–36.0)
MCV: 92.9 fl (ref 78.0–100.0)
Monocytes Absolute: 0.3 10*3/uL (ref 0.1–1.0)
Monocytes Relative: 2.9 % — ABNORMAL LOW (ref 3.0–12.0)
Neutro Abs: 10 10*3/uL — ABNORMAL HIGH (ref 1.4–7.7)
Neutrophils Relative %: 88.3 % — ABNORMAL HIGH (ref 43.0–77.0)
Platelets: 303 10*3/uL (ref 150.0–400.0)
RBC: 4.57 Mil/uL (ref 3.87–5.11)
RDW: 15.2 % (ref 11.5–15.5)
WBC: 11.3 10*3/uL — ABNORMAL HIGH (ref 4.0–10.5)

## 2020-10-17 LAB — H. PYLORI ANTIBODY, IGG: H Pylori IgG: POSITIVE — AB

## 2020-10-17 LAB — URINALYSIS
Bilirubin Urine: NEGATIVE
Hgb urine dipstick: NEGATIVE
Ketones, ur: NEGATIVE
Leukocytes,Ua: NEGATIVE
Nitrite: NEGATIVE
Specific Gravity, Urine: 1.015 (ref 1.000–1.030)
Total Protein, Urine: NEGATIVE
Urine Glucose: NEGATIVE
Urobilinogen, UA: 0.2 (ref 0.0–1.0)
pH: 7.5 (ref 5.0–8.0)

## 2020-10-17 LAB — LIPASE: Lipase: 24 U/L (ref 11.0–59.0)

## 2020-10-17 LAB — VITAMIN D 25 HYDROXY (VIT D DEFICIENCY, FRACTURES): VITD: 35.25 ng/mL (ref 30.00–100.00)

## 2020-10-17 MED ORDER — PANTOPRAZOLE SODIUM 40 MG PO TBEC: 40.0000 mg | DELAYED_RELEASE_TABLET | Freq: Two times a day (BID) | ORAL | 1 refills | Status: DC

## 2020-10-17 NOTE — Assessment & Plan Note (Addendum)
D/c oral calcium supplement  Obtain calcium/PTH, vitamin D level

## 2020-10-17 NOTE — Assessment & Plan Note (Addendum)
peripheral focal chorioretinal inflammation of both eyes  Patient is currently on Imuran and 10 mg of prednisone a day with a plan to taper off prednisone. She was not able to tolerate at higher dose of Imuran due to abdominal pain.

## 2020-10-17 NOTE — Assessment & Plan Note (Signed)
Continue with diltiazem. 

## 2020-10-17 NOTE — Progress Notes (Signed)
Subjective:  Patient ID: Joann Fields, female    DOB: March 14, 1951  Age: 70 y.o. MRN: [id number]  CC: Gastroesophageal Reflux (Pt states she has some other issues to discuss as well)   HPI Joann Fields presents for abd pain, indigestion - worse at night x1-2 wks  Per hx: Joann Fields is a 70 y.o. old female who presents to the clinic today for 9 week follow up for peripheral focal chorioretinal inflammation of both eyes. Initially referred by Dr. Talbert Forest. Patient has history of acute iritis of both eyes, dry eye syndrome of both eyes, bilateral pseudophakia, and systemic hypertension. Currently on Azathioprine 25 mg daily and oral prednisone 20 now on 10 mg mg daily x 2 months.       Outpatient Medications Prior to Visit  Medication Sig Dispense Refill  . azaTHIOprine (IMURAN) 50 MG tablet Take 25 mg by mouth.    . Calcium-Vitamin D-Vitamin K [id number] MG-UNT-MCG TABS Take 1 Dose by mouth in the morning and at bedtime. Chocolate chewable    . Cyanocobalamin (VITAMIN B-12) 1000 MCG SUBL PLACE 1 TABLET UNDER THE TONGUE DAILY. 100 tablet 3  . diltiazem (CARDIZEM CD) 180 MG 24 hr capsule TAKE 1 CAPSULE BY MOUTH EVERY DAY 90 capsule 3  . predniSONE (DELTASONE) 10 MG tablet Take by mouth.    . nepafenac (ILEVRO) 0.3 % ophthalmic suspension Apply to eye. (Patient not taking: No sig reported)     No facility-administered medications prior to visit.    ROS: Review of Systems  Constitutional: Positive for unexpected weight change. Negative for activity change, appetite change, chills and fatigue.  HENT: Negative for congestion, mouth sores and sinus pressure.   Eyes: Negative for visual disturbance.  Respiratory: Negative for cough and chest tightness.   Gastrointestinal: Positive for abdominal pain and nausea.  Genitourinary: Negative for difficulty urinating, frequency and vaginal pain.  Musculoskeletal: Negative for back pain and gait problem.  Skin: Negative for pallor and  rash.  Neurological: Negative for dizziness, tremors, weakness, numbness and headaches.  Psychiatric/Behavioral: Negative for confusion and sleep disturbance. The patient is not nervous/anxious.     Objective:  BP (!) 142/90 (BP Location: Left Arm)   Pulse 85   Temp 97.9 F (36.6 C) (Oral)   Ht 5\' 5"  (1.651 m)   Wt 153 lb 9.6 oz (69.7 kg)   SpO2 97%   BMI 25.56 kg/m   BP Readings from Last 3 Encounters:  10/17/20 (!) 142/90  09/20/20 140/80  07/29/20 (!) 142/82    Wt Readings from Last 3 Encounters:  10/17/20 153 lb 9.6 oz (69.7 kg)  09/20/20 154 lb 9.6 oz (70.1 kg)  07/29/20 160 lb (72.6 kg)    Physical Exam Constitutional:      General: She is not in acute distress.    Appearance: She is well-developed. She is obese.  HENT:     Head: Normocephalic.     Right Ear: External ear normal.     Left Ear: External ear normal.     Nose: Nose normal.     Mouth/Throat:     Mouth: Oropharynx is clear and moist.  Eyes:     General:        Right eye: No discharge.        Left eye: No discharge.     Conjunctiva/sclera: Conjunctivae normal.     Pupils: Pupils are equal, round, and reactive to light.  Neck:     Thyroid: No thyromegaly.  Vascular: No JVD.     Trachea: No tracheal deviation.  Cardiovascular:     Rate and Rhythm: Normal rate and regular rhythm.     Heart sounds: Normal heart sounds.  Pulmonary:     Effort: No respiratory distress.     Breath sounds: No stridor. No wheezing.  Abdominal:     General: Bowel sounds are normal. There is no distension.     Palpations: Abdomen is soft. There is no mass.     Tenderness: There is abdominal tenderness. There is no guarding or rebound.     Hernia: A hernia is present.  Musculoskeletal:        General: No tenderness or edema.     Cervical back: Normal range of motion and neck supple.  Lymphadenopathy:     Cervical: No cervical adenopathy.  Skin:    Findings: No erythema or rash.  Neurological:     Cranial  Nerves: No cranial nerve deficit.     Motor: No abnormal muscle tone.     Coordination: Coordination normal.     Deep Tendon Reflexes: Reflexes normal.  Psychiatric:        Mood and Affect: Mood and affect normal.        Behavior: Behavior normal.        Thought Content: Thought content normal.        Judgment: Judgment normal.   Abdomen is distended.  Sensitive in the upper one half of both sides.  There is a midline hernia, unchanged The face is somewhat cushingoid  Lab Results  Component Value Date   WBC 7.7 01/26/2020   HGB 13.2 01/26/2020   HCT 39.4 01/26/2020   PLT 264.0 01/26/2020   GLUCOSE 98 01/26/2020   CHOL 234 (H) 01/26/2020   TRIG 160.0 (H) 01/26/2020   HDL 47.50 01/26/2020   LDLDIRECT 165.3 09/21/2013   LDLCALC 154 (H) 01/26/2020   ALT 11 01/26/2020   AST 13 01/26/2020   NA 140 01/26/2020   K 3.9 01/26/2020   CL 106 01/26/2020   CREATININE 1.11 01/26/2020   BUN 14 01/26/2020   CO2 29 01/26/2020   TSH 1.36 01/26/2020   HGBA1C 5.5 10/23/2016    CT CARDIAC SCORING  Addendum Date: 03/06/2020   ADDENDUM REPORT: 03/06/2020 15:39 CLINICAL DATA:  Risk stratification EXAM: Coronary Calcium Score TECHNIQUE: The patient was scanned on a [employer] scanner. Axial non-contrast 3 mm slices were carried out through the heart. The data set was analyzed on a dedicated work station and scored using the [place]. FINDINGS: Non-cardiac: See separate report from [facility]. Ascending Aorta: Normal size, no calcifications. Pericardium: Normal. Coronary arteries: Normal origin. IMPRESSION: Coronary calcium score of 0. This was 0 percentile for age and sex matched control. Electronically Signed   By: Ena Dawley   On: 03/06/2020 15:39   Result Date: 03/06/2020 EXAM: OVER-READ INTERPRETATION  CT CHEST The following report is an over-read performed by radiologist Dr. Vinnie Langton of [facility], [place] on 03/06/2020. This over-read does not include  interpretation of cardiac or coronary anatomy or pathology. The coronary calcium score interpretation by the cardiologist is attached. COMPARISON:  None. FINDINGS: Within the visualized portions of the thorax there are no suspicious appearing pulmonary nodules or masses, there is no acute consolidative airspace disease, no pleural effusions, no pneumothorax and no lymphadenopathy. Visualized portions of the upper abdomen are unremarkable. There are no aggressive appearing lytic or blastic lesions noted in the visualized portions of the skeleton. IMPRESSION:  No significant incidental noncardiac findings are noted. Electronically Signed: By: Vinnie Langton M.D. On: 03/06/2020 13:24    Assessment & Plan:    Walker Kehr, MD

## 2020-10-17 NOTE — Assessment & Plan Note (Addendum)
New - etiology is unclear.  Could be related to Imuran therapy or steroid therapy Start pantoprazole 40 mg twice a day Obtain lab work including H. pylori, liver tests, CBC, UA Obtain abdominal ultrasound Blood test from [facility] were reviewed

## 2020-10-17 NOTE — Assessment & Plan Note (Signed)
Could be steroid-induced versus other.  Start Protonix twice a day. Obtain H. pylori

## 2020-10-18 LAB — PTH, INTACT AND CALCIUM
Calcium: 11.8 mg/dL — ABNORMAL HIGH (ref 8.6–10.4)
PTH: 45 pg/mL (ref 14–64)

## 2020-10-20 ENCOUNTER — Other Ambulatory Visit: Payer: Self-pay | Admitting: Internal Medicine

## 2020-10-20 MED ORDER — CLARITHROMYCIN 500 MG PO TABS: 500.0000 mg | ORAL_TABLET | Freq: Two times a day (BID) | ORAL | 0 refills | Status: AC

## 2020-10-20 MED ORDER — METRONIDAZOLE 250 MG PO TABS: 250.0000 mg | ORAL_TABLET | Freq: Four times a day (QID) | ORAL | 0 refills | Status: DC

## 2020-10-22 ENCOUNTER — Ambulatory Visit
Admission: RE | Admit: 2020-10-22 | Discharge: 2020-10-22 | Disposition: A | Discharge status: Home / Self Care | Payer: Medicare PPO | Source: Ambulatory Visit | Attending: Internal Medicine | Admitting: Internal Medicine

## 2020-10-22 DIAGNOSIS — N281 Cyst of kidney, acquired: Secondary | ICD-10-CM | POA: Diagnosis not present

## 2020-10-22 DIAGNOSIS — K7689 Other specified diseases of liver: Secondary | ICD-10-CM | POA: Diagnosis not present

## 2020-11-01 ENCOUNTER — Telehealth: Payer: Self-pay | Admitting: Internal Medicine

## 2020-11-01 NOTE — Telephone Encounter (Signed)
   Patient has follow up questions about [place] results, she would also like a copy mailed to her  Also questions about metroNIDAZOLE (FLAGYL) 250 MG tablet, how long should she take medication   Please call patient

## 2020-11-04 NOTE — Telephone Encounter (Signed)
Pt is wanting to know does she need to have the cyst check out.. or repeat U/S in couple of months.Marland KitchenJohny Fields

## 2020-11-07 NOTE — Telephone Encounter (Signed)
Called pt there was no answer LMOM RTC...lmb 

## 2020-11-07 NOTE — Telephone Encounter (Signed)
There is no need to worry about the cysts.  No need for follow-up ultrasound. Take metronidazole until finished Thanks

## 2020-11-08 ENCOUNTER — Other Ambulatory Visit: Payer: Self-pay | Admitting: Internal Medicine

## 2020-12-03 DIAGNOSIS — H35033 Hypertensive retinopathy, bilateral: Secondary | ICD-10-CM | POA: Diagnosis not present

## 2020-12-03 DIAGNOSIS — I10 Essential (primary) hypertension: Secondary | ICD-10-CM | POA: Diagnosis not present

## 2020-12-03 DIAGNOSIS — Z79899 Other long term (current) drug therapy: Secondary | ICD-10-CM | POA: Diagnosis not present

## 2020-12-03 DIAGNOSIS — Z961 Presence of intraocular lens: Secondary | ICD-10-CM | POA: Diagnosis not present

## 2020-12-03 DIAGNOSIS — H30033 Focal chorioretinal inflammation, peripheral, bilateral: Secondary | ICD-10-CM | POA: Diagnosis not present

## 2020-12-04 ENCOUNTER — Other Ambulatory Visit: Payer: Self-pay | Admitting: Internal Medicine

## 2021-01-27 ENCOUNTER — Ambulatory Visit: Payer: Medicare PPO | Admitting: Internal Medicine

## 2021-01-27 ENCOUNTER — Encounter: Payer: Self-pay | Admitting: Internal Medicine

## 2021-01-27 ENCOUNTER — Other Ambulatory Visit: Payer: Self-pay

## 2021-01-27 DIAGNOSIS — I10 Essential (primary) hypertension: Secondary | ICD-10-CM

## 2021-01-27 DIAGNOSIS — E785 Hyperlipidemia, unspecified: Secondary | ICD-10-CM

## 2021-01-27 DIAGNOSIS — R1010 Upper abdominal pain, unspecified: Secondary | ICD-10-CM

## 2021-01-27 DIAGNOSIS — E538 Deficiency of other specified B group vitamins: Secondary | ICD-10-CM

## 2021-01-27 NOTE — Assessment & Plan Note (Signed)
CT coronary ca scoring test is 0

## 2021-01-27 NOTE — Assessment & Plan Note (Addendum)
[place] was nl No relapse - still on Imuran On Protonix

## 2021-01-27 NOTE — Assessment & Plan Note (Signed)
On B12 

## 2021-01-27 NOTE — Assessment & Plan Note (Signed)
CT coronary ca scoring test is 0 -- 2021

## 2021-01-27 NOTE — Progress Notes (Signed)
Subjective:  Patient ID: Joann Fields, female    DOB: 18-Jun-1951  Age: 70 y.o. MRN: [id number]  CC: Follow-up (6 month  check)   HPI Joann Fields presents for abd pain f/u - resolved; CRI  Outpatient Medications Prior to Visit  Medication Sig Dispense Refill  . azaTHIOprine (IMURAN) 50 MG tablet Take 25 mg by mouth.    . Calcium-Vitamin D-Vitamin K [phone] MG-UNT-MCG TABS Take 1 Dose by mouth in the morning and at bedtime. Chocolate chewable    . Cyanocobalamin (VITAMIN B-12) 1000 MCG SUBL PLACE 1 TABLET UNDER THE TONGUE DAILY. 100 tablet 3  . diltiazem (CARDIZEM CD) 180 MG 24 hr capsule TAKE 1 CAPSULE BY MOUTH EVERY DAY 90 capsule 3  . metroNIDAZOLE (FLAGYL) 250 MG tablet Take 1 tablet (250 mg total) by mouth 4 (four) times daily. 56 tablet 0  . pantoprazole (PROTONIX) 40 MG tablet TAKE 1 TABLET BY MOUTH TWICE A DAY 60 tablet 5  . predniSONE (DELTASONE) 10 MG tablet Take by mouth.     No facility-administered medications prior to visit.    ROS: Review of Systems  Constitutional: Negative for activity change, appetite change, chills, diaphoresis, fatigue, fever and unexpected weight change.  HENT: Negative for congestion, dental problem, ear pain, hearing loss, mouth sores, postnasal drip, sinus pressure, sneezing, sore throat and voice change.   Eyes: Positive for visual disturbance. Negative for pain.  Respiratory: Negative for cough, chest tightness, wheezing and stridor.   Cardiovascular: Negative for chest pain, palpitations and leg swelling.  Gastrointestinal: Negative for abdominal distention, abdominal pain, blood in stool, nausea, rectal pain and vomiting.  Genitourinary: Negative for difficulty urinating, dysuria and frequency.  Musculoskeletal: Positive for arthralgias. Negative for back pain, gait problem, joint swelling and neck pain.  Skin: Negative for color change, rash and wound.  Neurological: Negative for dizziness, tremors, syncope, speech  difficulty, weakness and light-headedness.  Hematological: Negative for adenopathy.  Psychiatric/Behavioral: Negative for behavioral problems, confusion, decreased concentration, dysphoric mood, hallucinations, sleep disturbance and suicidal ideas. The patient is not nervous/anxious and is not hyperactive.     Objective:  BP 136/82   Pulse 76   Temp 98.7 F (37.1 C) (Oral)   Ht 5\' 5"  (1.651 m)   Wt 155 lb (70.3 kg)   SpO2 99%   BMI 25.79 kg/m   BP Readings from Last 3 Encounters:  01/27/21 136/82  10/17/20 (!) 142/90  09/20/20 140/80    Wt Readings from Last 3 Encounters:  01/27/21 155 lb (70.3 kg)  10/17/20 153 lb 9.6 oz (69.7 kg)  09/20/20 154 lb 9.6 oz (70.1 kg)    Physical Exam Constitutional:      General: She is not in acute distress.    Appearance: She is well-developed.  HENT:     Head: Normocephalic.     Right Ear: External ear normal.     Left Ear: External ear normal.     Nose: Nose normal.  Eyes:     General:        Right eye: No discharge.        Left eye: No discharge.     Conjunctiva/sclera: Conjunctivae normal.     Pupils: Pupils are equal, round, and reactive to light.  Neck:     Thyroid: No thyromegaly.     Vascular: No JVD.     Trachea: No tracheal deviation.  Cardiovascular:     Rate and Rhythm: Normal rate and regular rhythm.     Heart  sounds: Normal heart sounds.  Pulmonary:     Effort: No respiratory distress.     Breath sounds: No stridor. No wheezing.  Abdominal:     General: Bowel sounds are normal. There is no distension.     Palpations: Abdomen is soft. There is no mass.     Tenderness: There is no abdominal tenderness. There is no guarding or rebound.  Musculoskeletal:        General: No tenderness.     Cervical back: Normal range of motion and neck supple.  Lymphadenopathy:     Cervical: No cervical adenopathy.  Skin:    Findings: No erythema or rash.  Neurological:     Cranial Nerves: No cranial nerve deficit.     Motor:  No abnormal muscle tone.     Coordination: Coordination normal.     Deep Tendon Reflexes: Reflexes normal.  Psychiatric:        Behavior: Behavior normal.        Thought Content: Thought content normal.        Judgment: Judgment normal.     Lab Results  Component Value Date   WBC 11.3 (H) 10/17/2020   HGB 14.3 10/17/2020   HCT 42.4 10/17/2020   PLT 303.0 10/17/2020   GLUCOSE 121 (H) 10/17/2020   CHOL 234 (H) 01/26/2020   TRIG 160.0 (H) 01/26/2020   HDL 47.50 01/26/2020   LDLDIRECT 165.3 09/21/2013   LDLCALC 154 (H) 01/26/2020   ALT 19 10/17/2020   AST 13 10/17/2020   NA 139 10/17/2020   K 4.1 10/17/2020   CL 101 10/17/2020   CREATININE 1.42 (H) 10/17/2020   BUN 18 10/17/2020   CO2 32 10/17/2020   TSH 1.36 01/26/2020   HGBA1C 5.5 10/23/2016    US Abdomen Complete  Result Date: 10/22/2020 CLINICAL DATA:  Bilateral flank and upper abdominal pain for 2 weeks EXAM: ABDOMEN ULTRASOUND COMPLETE COMPARISON:  None. FINDINGS: Gallbladder: No gallstones or wall thickening visualized. No sonographic Murphy sign noted by sonographer. Common bile duct: Diameter: 2 mm Liver: Liver echotexture is grossly unremarkable. 1.4 x 1.2 x 1.5 cm cyst within the inferior right lobe liver is noted. No intrahepatic duct dilation. Portal vein is patent on color Doppler imaging with normal direction of blood flow towards the liver. IVC: No abnormality visualized. Pancreas: Visualized portion unremarkable. Spleen: Size and appearance within normal limits. Right Kidney: Length: 9.6 cm. Echogenicity within normal limits. No mass or hydronephrosis visualized. Left Kidney: Length: 10.3 cm. Echogenicity within normal limits. 2.8 cm simple cyst lower pole left kidney. No hydronephrosis. Abdominal aorta: No aneurysm visualized. Other findings: None. IMPRESSION: 1. No acute intra-abdominal process. 2. Hepatic and left renal cysts as above. Electronically Signed   By: Randa Ngo M.D.   On: 10/22/2020 23:12     Assessment & Plan:    Walker Kehr, MD

## 2021-02-07 DIAGNOSIS — H35033 Hypertensive retinopathy, bilateral: Secondary | ICD-10-CM | POA: Diagnosis not present

## 2021-02-07 DIAGNOSIS — Z79899 Other long term (current) drug therapy: Secondary | ICD-10-CM | POA: Diagnosis not present

## 2021-02-07 DIAGNOSIS — Z961 Presence of intraocular lens: Secondary | ICD-10-CM | POA: Diagnosis not present

## 2021-02-07 DIAGNOSIS — H30033 Focal chorioretinal inflammation, peripheral, bilateral: Secondary | ICD-10-CM | POA: Diagnosis not present

## 2021-02-12 DIAGNOSIS — H30033 Focal chorioretinal inflammation, peripheral, bilateral: Secondary | ICD-10-CM | POA: Diagnosis not present

## 2021-02-12 DIAGNOSIS — Z79899 Other long term (current) drug therapy: Secondary | ICD-10-CM | POA: Diagnosis not present

## 2021-02-24 ENCOUNTER — Other Ambulatory Visit: Payer: Self-pay | Admitting: Internal Medicine

## 2021-03-10 ENCOUNTER — Ambulatory Visit: Payer: Medicare PPO | Admitting: Podiatry

## 2021-03-10 ENCOUNTER — Other Ambulatory Visit: Payer: Self-pay

## 2021-03-10 ENCOUNTER — Ambulatory Visit (INDEPENDENT_AMBULATORY_CARE_PROVIDER_SITE_OTHER): Payer: Medicare PPO

## 2021-03-10 DIAGNOSIS — M722 Plantar fascial fibromatosis: Secondary | ICD-10-CM | POA: Diagnosis not present

## 2021-03-10 DIAGNOSIS — M79671 Pain in right foot: Secondary | ICD-10-CM

## 2021-03-10 NOTE — Progress Notes (Signed)
Subjective: 70 year old female presents the office today for concerns of right heel pain which has been ongoing about 6 months.  She describes soreness but not consistent.  She states that mostly at nighttime which is been her feet all day.  Discussed with throbbing sensation max her pain level is 10/10 but not consistent.  No redness or swelling.  No recent injury. Denies any systemic complaints such as fevers, chills, nausea, vomiting. No acute changes since last appointment, and no other complaints at this time.   Objective: AAO x3, NAD DP/PT pulses palpable bilaterally, CRT less than 3 seconds Tenderness to palpation along the plantar medial tubercle of the calcaneus at the insertion of plantar fascia on the right foot. There is no pain along the course of the plantar fascia within the arch of the foot. Plantar fascia appears to be intact. There is no pain with lateral compression of the calcaneus or pain with vibratory sensation. There is no pain along the course or insertion of the achilles tendon. No other areas of tenderness to bilateral lower extremities.  Negative Tinel sign. No open lesions or pre-ulcerative lesions.  No pain with calf compression, swelling, warmth, erythema  Assessment: Right heel pain, plantar fasciitis  Plan: -All treatment options discussed with the patient including all alternatives, risks, complications.  -X-rays obtained reviewed.  No evidence of acute fracture identified. -Plantar fascial brace dispensed. -Discussed stretching, icing daily. -Anti-inflammatories as needed -Offered steroid injection -Patient encouraged to call the office with any questions, concerns, change in symptoms.   Trula Slade DPM

## 2021-03-10 NOTE — Patient Instructions (Addendum)
For instructions on how to put on your Plantar Fascial Brace, please visit [email or web address]   Plantar Fasciitis (Heel Spur Syndrome) with Rehab The plantar fascia is a fibrous, ligament-like, soft-tissue structure that spans the bottom of the foot. Plantar fasciitis is a condition that causes pain in the foot due to inflammation of the tissue. SYMPTOMS   Pain and tenderness on the underneath side of the foot.  Pain that worsens with standing or walking. CAUSES  Plantar fasciitis is caused by irritation and injury to the plantar fascia on the underneath side of the foot. Common mechanisms of injury include:  Direct trauma to bottom of the foot.  Damage to a small nerve that runs under the foot where the main fascia attaches to the heel bone.  Stress placed on the plantar fascia due to bone spurs. RISK INCREASES WITH:   Activities that place stress on the plantar fascia (running, jumping, pivoting, or cutting).  Poor strength and flexibility.  Improperly fitted shoes.  Tight calf muscles.  Flat feet.  Failure to warm-up properly before activity.  Obesity. PREVENTION  Warm up and stretch properly before activity.  Allow for adequate recovery between workouts.  Maintain physical fitness:  Strength, flexibility, and endurance.  Cardiovascular fitness.  Maintain a health body weight.  Avoid stress on the plantar fascia.  Wear properly fitted shoes, including arch supports for individuals who have flat feet.  PROGNOSIS  If treated properly, then the symptoms of plantar fasciitis usually resolve without surgery. However, occasionally surgery is necessary.  RELATED COMPLICATIONS   Recurrent symptoms that may result in a chronic condition.  Problems of the lower back that are caused by compensating for the injury, such as limping.  Pain or weakness of the foot during push-off following surgery.  Chronic inflammation, scarring, and partial or complete  fascia tear, occurring more often from repeated injections.  TREATMENT  Treatment initially involves the use of ice and medication to help reduce pain and inflammation. The use of strengthening and stretching exercises may help reduce pain with activity, especially stretches of the Achilles tendon. These exercises may be performed at home or with a therapist. Your caregiver may recommend that you use heel cups of arch supports to help reduce stress on the plantar fascia. Occasionally, corticosteroid injections are given to reduce inflammation. If symptoms persist for greater than 6 months despite non-surgical (conservative), then surgery may be recommended.   MEDICATION   If pain medication is necessary, then nonsteroidal anti-inflammatory medications, such as aspirin and ibuprofen, or other minor pain relievers, such as acetaminophen, are often recommended.  Do not take pain medication within 7 days before surgery.  Prescription pain relievers may be given if deemed necessary by your caregiver. Use only as directed and only as much as you need.  Corticosteroid injections may be given by your caregiver. These injections should be reserved for the most serious cases, because they may only be given a certain number of times.  HEAT AND COLD  Cold treatment (icing) relieves pain and reduces inflammation. Cold treatment should be applied for 10 to 15 minutes every 2 to 3 hours for inflammation and pain and immediately after any activity that aggravates your symptoms. Use ice packs or massage the area with a piece of ice (ice massage).  Heat treatment may be used prior to performing the stretching and strengthening activities prescribed by your caregiver, physical therapist, or athletic trainer. Use a heat pack or soak the injury in warm water.  SEEK IMMEDIATE MEDICAL   CARE IF:  Treatment seems to offer no benefit, or the condition worsens.  Any medications produce adverse side effects.   EXERCISES- RANGE OF MOTION (ROM) AND STRETCHING EXERCISES - Plantar Fasciitis (Heel Spur Syndrome) These exercises may help you when beginning to rehabilitate your injury. Your symptoms may resolve with or without further involvement from your physician, physical therapist or athletic trainer. While completing these exercises, remember:   Restoring tissue flexibility helps normal motion to return to the joints. This allows healthier, less painful movement and activity.  An effective stretch should be held for at least 30 seconds.  A stretch should never be painful. You should only feel a gentle lengthening or release in the stretched tissue.  RANGE OF MOTION - Toe Extension, Flexion  Sit with your right / left leg crossed over your opposite knee.  Grasp your toes and gently pull them back toward the top of your foot. You should feel a stretch on the bottom of your toes and/or foot.  Hold this stretch for 10 seconds.  Now, gently pull your toes toward the bottom of your foot. You should feel a stretch on the top of your toes and or foot.  Hold this stretch for 10 seconds. Repeat  times. Complete this stretch 3 times per day.   RANGE OF MOTION - Ankle Dorsiflexion, Active Assisted  Remove shoes and sit on a chair that is preferably not on a carpeted surface.  Place right / left foot under knee. Extend your opposite leg for support.  Keeping your heel down, slide your right / left foot back toward the chair until you feel a stretch at your ankle or calf. If you do not feel a stretch, slide your bottom forward to the edge of the chair, while still keeping your heel down.  Hold this stretch for 10 seconds. Repeat 3 times. Complete this stretch 2 times per day.   STRETCH  Gastroc, Standing  Place hands on wall.  Extend right / left leg, keeping the front knee somewhat bent.  Slightly point your toes inward on your back foot.  Keeping your right / left heel on the floor and your  knee straight, shift your weight toward the wall, not allowing your back to arch.  You should feel a gentle stretch in the right / left calf. Hold this position for 10 seconds. Repeat 3 times. Complete this stretch 2 times per day.  STRETCH  Soleus, Standing  Place hands on wall.  Extend right / left leg, keeping the other knee somewhat bent.  Slightly point your toes inward on your back foot.  Keep your right / left heel on the floor, bend your back knee, and slightly shift your weight over the back leg so that you feel a gentle stretch deep in your back calf.  Hold this position for 10 seconds. Repeat 3 times. Complete this stretch 2 times per day.  STRETCH  Gastrocsoleus, Standing  Note: This exercise can place a lot of stress on your foot and ankle. Please complete this exercise only if specifically instructed by your caregiver.   Place the ball of your right / left foot on a step, keeping your other foot firmly on the same step.  Hold on to the wall or a rail for balance.  Slowly lift your other foot, allowing your body weight to press your heel down over the edge of the step.  You should feel a stretch in your right / left calf.  Hold this   position for 10 seconds.  Repeat this exercise with a slight bend in your right / left knee. Repeat 3 times. Complete this stretch 2 times per day.   STRENGTHENING EXERCISES - Plantar Fasciitis (Heel Spur Syndrome)  These exercises may help you when beginning to rehabilitate your injury. They may resolve your symptoms with or without further involvement from your physician, physical therapist or athletic trainer. While completing these exercises, remember:   Muscles can gain both the endurance and the strength needed for everyday activities through controlled exercises.  Complete these exercises as instructed by your physician, physical therapist or athletic trainer. Progress the resistance and repetitions only as guided.  STRENGTH -  Towel Curls  Sit in a chair positioned on a non-carpeted surface.  Place your foot on a towel, keeping your heel on the floor.  Pull the towel toward your heel by only curling your toes. Keep your heel on the floor. Repeat 3 times. Complete this exercise 2 times per day.  STRENGTH - Ankle Inversion  Secure one end of a rubber exercise band/tubing to a fixed object (table, pole). Loop the other end around your foot just before your toes.  Place your fists between your knees. This will focus your strengthening at your ankle.  Slowly, pull your big toe up and in, making sure the band/tubing is positioned to resist the entire motion.  Hold this position for 10 seconds.  Have your muscles resist the band/tubing as it slowly pulls your foot back to the starting position. Repeat 3 times. Complete this exercises 2 times per day.  Document Released: 08/10/2005 Document Revised: 11/02/2011 Document Reviewed: 11/22/2008 [employer] Patient Information 2014 [employer]. 

## 2021-03-18 DIAGNOSIS — Z1231 Encounter for screening mammogram for malignant neoplasm of breast: Secondary | ICD-10-CM | POA: Diagnosis not present

## 2021-03-18 DIAGNOSIS — M8580 Other specified disorders of bone density and structure, unspecified site: Secondary | ICD-10-CM | POA: Diagnosis not present

## 2021-03-18 DIAGNOSIS — Z6826 Body mass index (BMI) 26.0-26.9, adult: Secondary | ICD-10-CM | POA: Diagnosis not present

## 2021-03-18 DIAGNOSIS — Z124 Encounter for screening for malignant neoplasm of cervix: Secondary | ICD-10-CM | POA: Diagnosis not present

## 2021-03-18 DIAGNOSIS — Z01411 Encounter for gynecological examination (general) (routine) with abnormal findings: Secondary | ICD-10-CM | POA: Diagnosis not present

## 2021-03-18 DIAGNOSIS — Z01419 Encounter for gynecological examination (general) (routine) without abnormal findings: Secondary | ICD-10-CM | POA: Diagnosis not present

## 2021-03-20 ENCOUNTER — Telehealth: Payer: Self-pay | Admitting: Podiatry

## 2021-03-20 NOTE — Telephone Encounter (Signed)
I Called the patient to let her know , also told her to gives [place] a call back if she had anymore questions or concerns.

## 2021-03-20 NOTE — Telephone Encounter (Signed)
Patient called the office wanting to know if she can take tylenol for her heel spur.

## 2021-03-21 ENCOUNTER — Other Ambulatory Visit: Payer: Self-pay | Admitting: Obstetrics and Gynecology

## 2021-03-21 DIAGNOSIS — M8580 Other specified disorders of bone density and structure, unspecified site: Secondary | ICD-10-CM

## 2021-04-25 DIAGNOSIS — R32 Unspecified urinary incontinence: Secondary | ICD-10-CM | POA: Diagnosis not present

## 2021-04-25 DIAGNOSIS — D84821 Immunodeficiency due to drugs: Secondary | ICD-10-CM | POA: Diagnosis not present

## 2021-04-25 DIAGNOSIS — K219 Gastro-esophageal reflux disease without esophagitis: Secondary | ICD-10-CM | POA: Diagnosis not present

## 2021-04-25 DIAGNOSIS — G629 Polyneuropathy, unspecified: Secondary | ICD-10-CM | POA: Diagnosis not present

## 2021-04-25 DIAGNOSIS — M8580 Other specified disorders of bone density and structure, unspecified site: Secondary | ICD-10-CM | POA: Diagnosis not present

## 2021-04-25 DIAGNOSIS — I10 Essential (primary) hypertension: Secondary | ICD-10-CM | POA: Diagnosis not present

## 2021-04-25 DIAGNOSIS — H5789 Other specified disorders of eye and adnexa: Secondary | ICD-10-CM | POA: Diagnosis not present

## 2021-04-25 DIAGNOSIS — L309 Dermatitis, unspecified: Secondary | ICD-10-CM | POA: Diagnosis not present

## 2021-04-25 DIAGNOSIS — Z79899 Other long term (current) drug therapy: Secondary | ICD-10-CM | POA: Diagnosis not present

## 2021-05-06 DIAGNOSIS — Z79899 Other long term (current) drug therapy: Secondary | ICD-10-CM | POA: Diagnosis not present

## 2021-05-06 DIAGNOSIS — Z961 Presence of intraocular lens: Secondary | ICD-10-CM | POA: Diagnosis not present

## 2021-05-06 DIAGNOSIS — H35033 Hypertensive retinopathy, bilateral: Secondary | ICD-10-CM | POA: Diagnosis not present

## 2021-05-06 DIAGNOSIS — I10 Essential (primary) hypertension: Secondary | ICD-10-CM | POA: Diagnosis not present

## 2021-05-06 DIAGNOSIS — H30033 Focal chorioretinal inflammation, peripheral, bilateral: Secondary | ICD-10-CM | POA: Diagnosis not present

## 2021-05-08 DIAGNOSIS — L811 Chloasma: Secondary | ICD-10-CM | POA: Insufficient documentation

## 2021-05-08 DIAGNOSIS — L678 Other hair color and hair shaft abnormalities: Secondary | ICD-10-CM | POA: Diagnosis not present

## 2021-05-09 DIAGNOSIS — Z79899 Other long term (current) drug therapy: Secondary | ICD-10-CM | POA: Diagnosis not present

## 2021-05-09 DIAGNOSIS — H30033 Focal chorioretinal inflammation, peripheral, bilateral: Secondary | ICD-10-CM | POA: Diagnosis not present

## 2021-05-13 ENCOUNTER — Telehealth: Payer: Self-pay | Admitting: *Deleted

## 2021-05-13 NOTE — Telephone Encounter (Signed)
Patient is calling because the heel pain has returned, bothers her when getting up at night,during the day after sitting and standing to walk. Please advise/schedule f/u appointment.

## 2021-05-14 NOTE — Telephone Encounter (Signed)
Returned the call to patient, no answer, left vmessage per Dr Leigh Aurora recommendations of stretching, icing daily and if pain continues, schedule a f/u appointment in office.

## 2021-05-26 ENCOUNTER — Telehealth: Payer: Self-pay | Admitting: Internal Medicine

## 2021-05-26 NOTE — Telephone Encounter (Signed)
Patient has some health questions she would like to speak to tha nurse about.. please call [phone]

## 2021-05-29 NOTE — Telephone Encounter (Signed)
Called pt there was no answer LMOM RTC...lmb 

## 2021-05-30 NOTE — Telephone Encounter (Signed)
   Patient calling to report she has not taken Protonix in a week because she wasn't having any issues. Can this medication be restarted if not taking for an extended period of time?   Also patient has questions about getting shingles vaccine

## 2021-05-30 NOTE — Telephone Encounter (Signed)
Called pt she states she stop taking the pantoprazole because she wasn't having any issues w/ her stomach. Wanting to know if symptom start can she start back taking. Inform pt yes, and I will updated med list to take PRN. Also pt wanted to get shinglerx. Inform pt since she has Medicare she will have to get from her pharmacy. Medicare requires then to get at pharmacy. Pt states she will make appt, aldo inform her once she receive to have them send [place] documentation so we can put in her chart.Marland KitchenJohny Chess

## 2021-07-07 ENCOUNTER — Other Ambulatory Visit: Payer: Self-pay | Admitting: Internal Medicine

## 2021-07-30 ENCOUNTER — Ambulatory Visit: Payer: Medicare PPO | Admitting: Internal Medicine

## 2021-07-30 ENCOUNTER — Encounter: Payer: Self-pay | Admitting: Internal Medicine

## 2021-07-30 ENCOUNTER — Other Ambulatory Visit: Payer: Self-pay

## 2021-07-30 DIAGNOSIS — E538 Deficiency of other specified B group vitamins: Secondary | ICD-10-CM

## 2021-07-30 DIAGNOSIS — M545 Low back pain, unspecified: Secondary | ICD-10-CM

## 2021-07-30 DIAGNOSIS — I10 Essential (primary) hypertension: Secondary | ICD-10-CM | POA: Diagnosis not present

## 2021-07-30 DIAGNOSIS — R1010 Upper abdominal pain, unspecified: Secondary | ICD-10-CM

## 2021-07-30 DIAGNOSIS — N183 Chronic kidney disease, stage 3 unspecified: Secondary | ICD-10-CM

## 2021-07-30 DIAGNOSIS — G8929 Other chronic pain: Secondary | ICD-10-CM

## 2021-07-30 DIAGNOSIS — N2889 Other specified disorders of kidney and ureter: Secondary | ICD-10-CM

## 2021-07-30 NOTE — Assessment & Plan Note (Signed)
On B12 

## 2021-07-30 NOTE — Progress Notes (Signed)
Subjective:  Patient ID: Joann Fields, female    DOB: 1951/02/11  Age: 70 y.o. MRN: [id number]  CC: Follow-up (6 MONTH F/U)   HPI RASHAUNA TEP presents for elev calcium, LBP, HTN  Outpatient Medications Prior to Visit  Medication Sig Dispense Refill   azaTHIOprine (IMURAN) 50 MG tablet Take 100 mg by mouth daily.     Cyanocobalamin (VITAMIN B-12) 1000 MCG SUBL PLACE 1 TABLET UNDER THE TONGUE DAILY. 100 tablet 3   diltiazem (CARDIZEM CD) 180 MG 24 hr capsule TAKE 1 CAPSULE BY MOUTH EVERY DAY 90 capsule 3   pantoprazole (PROTONIX) 40 MG tablet TAKE 1 TABLET BY MOUTH TWICE A DAY 180 tablet 1   Calcium-Vitamin D-Vitamin K [id number] MG-UNT-MCG TABS Take 1 Dose by mouth in the morning and at bedtime. Chocolate chewable (Patient not taking: Reported on 07/30/2021)     metroNIDAZOLE (FLAGYL) 250 MG tablet Take 1 tablet (250 mg total) by mouth 4 (four) times daily. (Patient not taking: Reported on 07/30/2021) 56 tablet 0   predniSONE (DELTASONE) 10 MG tablet Take by mouth. (Patient not taking: Reported on 07/30/2021)     No facility-administered medications prior to visit.    ROS: Review of Systems  Constitutional:  Negative for activity change, appetite change, chills, fatigue and unexpected weight change.  HENT:  Negative for congestion, mouth sores and sinus pressure.   Eyes:  Negative for visual disturbance.  Respiratory:  Negative for cough and chest tightness.   Gastrointestinal:  Negative for abdominal pain and nausea.  Genitourinary:  Negative for difficulty urinating, frequency and vaginal pain.  Musculoskeletal:  Negative for back pain and gait problem.  Skin:  Negative for pallor and rash.  Neurological:  Negative for dizziness, tremors, weakness, numbness and headaches.  Psychiatric/Behavioral:  Negative for confusion, sleep disturbance and suicidal ideas.    Objective:  BP 112/72 (BP Location: Left Arm)   Pulse 72   Temp 98.1 F (36.7 C) (Oral)   Ht 5\' 5"  (1.651  m)   Wt 155 lb 6.4 oz (70.5 kg)   SpO2 98%   BMI 25.86 kg/m   BP Readings from Last 3 Encounters:  07/30/21 112/72  01/27/21 136/82  10/17/20 (!) 142/90    Wt Readings from Last 3 Encounters:  07/30/21 155 lb 6.4 oz (70.5 kg)  01/27/21 155 lb (70.3 kg)  10/17/20 153 lb 9.6 oz (69.7 kg)    Physical Exam Constitutional:      General: She is not in acute distress.    Appearance: She is well-developed.  HENT:     Head: Normocephalic.     Right Ear: External ear normal.     Left Ear: External ear normal.     Nose: Nose normal.  Eyes:     General:        Right eye: No discharge.        Left eye: No discharge.     Conjunctiva/sclera: Conjunctivae normal.     Pupils: Pupils are equal, round, and reactive to light.  Neck:     Thyroid: No thyromegaly.     Vascular: No JVD.     Trachea: No tracheal deviation.  Cardiovascular:     Rate and Rhythm: Normal rate and regular rhythm.     Heart sounds: Normal heart sounds.  Pulmonary:     Effort: No respiratory distress.     Breath sounds: No stridor. No wheezing.  Abdominal:     General: Bowel sounds are normal. There is no  distension.     Palpations: Abdomen is soft. There is no mass.     Tenderness: There is no abdominal tenderness. There is no guarding or rebound.  Musculoskeletal:        General: No tenderness.     Cervical back: Normal range of motion and neck supple. No rigidity.  Lymphadenopathy:     Cervical: No cervical adenopathy.  Skin:    Findings: No erythema or rash.  Neurological:     Cranial Nerves: No cranial nerve deficit.     Motor: No abnormal muscle tone.     Coordination: Coordination normal.     Deep Tendon Reflexes: Reflexes normal.  Psychiatric:        Behavior: Behavior normal.        Thought Content: Thought content normal.        Judgment: Judgment normal.    Lab Results  Component Value Date   WBC 11.3 (H) 10/17/2020   HGB 14.3 10/17/2020   HCT 42.4 10/17/2020   PLT 303.0 10/17/2020    GLUCOSE 121 (H) 10/17/2020   CHOL 234 (H) 01/26/2020   TRIG 160.0 (H) 01/26/2020   HDL 47.50 01/26/2020   LDLDIRECT 165.3 09/21/2013   LDLCALC 154 (H) 01/26/2020   ALT 19 10/17/2020   AST 13 10/17/2020   NA 139 10/17/2020   K 4.1 10/17/2020   CL 101 10/17/2020   CREATININE 1.42 (H) 10/17/2020   BUN 18 10/17/2020   CO2 32 10/17/2020   TSH 1.36 01/26/2020   HGBA1C 5.5 10/23/2016    US Abdomen Complete  Result Date: 10/22/2020 CLINICAL DATA:  Bilateral flank and upper abdominal pain for 2 weeks EXAM: ABDOMEN ULTRASOUND COMPLETE COMPARISON:  None. FINDINGS: Gallbladder: No gallstones or wall thickening visualized. No sonographic Murphy sign noted by sonographer. Common bile duct: Diameter: 2 mm Liver: Liver echotexture is grossly unremarkable. 1.4 x 1.2 x 1.5 cm cyst within the inferior right lobe liver is noted. No intrahepatic duct dilation. Portal vein is patent on color Doppler imaging with normal direction of blood flow towards the liver. IVC: No abnormality visualized. Pancreas: Visualized portion unremarkable. Spleen: Size and appearance within normal limits. Right Kidney: Length: 9.6 cm. Echogenicity within normal limits. No mass or hydronephrosis visualized. Left Kidney: Length: 10.3 cm. Echogenicity within normal limits. 2.8 cm simple cyst lower pole left kidney. No hydronephrosis. Abdominal aorta: No aneurysm visualized. Other findings: None. IMPRESSION: 1. No acute intra-abdominal process. 2. Hepatic and left renal cysts as above. Electronically Signed   By: Randa Ngo M.D.   On: 10/22/2020 23:12    Assessment & Plan:   Problem List Items Addressed This Visit     Abdominal pain    On Protonix - - use prn      B12 deficiency    On B12      CRI (chronic renal insufficiency), stage 3 (moderate) (HCC)    GFR 43-50 Hydrate well      Essential hypertension    On Diltiazem      Hypercalcemia    D/c calcium Check CMET      Low back pain    Start doing yoga          No orders of the defined types were placed in this encounter.     Follow-up: No follow-ups on file.  Walker Kehr, MD

## 2021-07-30 NOTE — Assessment & Plan Note (Signed)
D/c calcium Check CMET

## 2021-07-30 NOTE — Assessment & Plan Note (Signed)
Start doing yoga

## 2021-07-30 NOTE — Assessment & Plan Note (Signed)
GFR 43-50 Hydrate well

## 2021-07-30 NOTE — Assessment & Plan Note (Signed)
On Diltiazem

## 2021-07-30 NOTE — Assessment & Plan Note (Signed)
On Protonix - - use prn

## 2021-08-19 DIAGNOSIS — Z79899 Other long term (current) drug therapy: Secondary | ICD-10-CM | POA: Diagnosis not present

## 2021-08-19 DIAGNOSIS — H30033 Focal chorioretinal inflammation, peripheral, bilateral: Secondary | ICD-10-CM | POA: Diagnosis not present

## 2021-08-19 DIAGNOSIS — H35033 Hypertensive retinopathy, bilateral: Secondary | ICD-10-CM | POA: Diagnosis not present

## 2021-08-19 DIAGNOSIS — Z961 Presence of intraocular lens: Secondary | ICD-10-CM | POA: Diagnosis not present

## 2021-08-21 DIAGNOSIS — H2013 Chronic iridocyclitis, bilateral: Secondary | ICD-10-CM | POA: Diagnosis not present

## 2021-08-21 DIAGNOSIS — H30033 Focal chorioretinal inflammation, peripheral, bilateral: Secondary | ICD-10-CM | POA: Diagnosis not present

## 2021-08-21 DIAGNOSIS — H5212 Myopia, left eye: Secondary | ICD-10-CM | POA: Diagnosis not present

## 2021-08-21 DIAGNOSIS — H5201 Hypermetropia, right eye: Secondary | ICD-10-CM | POA: Diagnosis not present

## 2021-08-21 DIAGNOSIS — H52223 Regular astigmatism, bilateral: Secondary | ICD-10-CM | POA: Diagnosis not present

## 2021-08-21 DIAGNOSIS — H524 Presbyopia: Secondary | ICD-10-CM | POA: Diagnosis not present

## 2021-08-21 DIAGNOSIS — Z79899 Other long term (current) drug therapy: Secondary | ICD-10-CM | POA: Diagnosis not present

## 2021-09-11 DIAGNOSIS — L438 Other lichen planus: Secondary | ICD-10-CM | POA: Diagnosis not present

## 2021-09-11 DIAGNOSIS — L819 Disorder of pigmentation, unspecified: Secondary | ICD-10-CM | POA: Diagnosis not present

## 2021-09-19 ENCOUNTER — Telehealth: Payer: Self-pay | Admitting: Internal Medicine

## 2021-09-19 NOTE — Telephone Encounter (Signed)
Left message for patient to call back to schedule Medicare Annual Wellness Visit   Last AWV  09/20/20  Please schedule at anytime with [facility] Major if patient calls the office back.    40 Minutes appointment   Any questions, please call me at [phone]

## 2021-09-22 ENCOUNTER — Ambulatory Visit
Admission: RE | Admit: 2021-09-22 | Discharge: 2021-09-22 | Disposition: A | Discharge status: Home / Self Care | Payer: Medicare PPO | Source: Ambulatory Visit | Attending: Obstetrics and Gynecology | Admitting: Obstetrics and Gynecology

## 2021-09-22 DIAGNOSIS — M8580 Other specified disorders of bone density and structure, unspecified site: Secondary | ICD-10-CM

## 2021-09-22 DIAGNOSIS — M8589 Other specified disorders of bone density and structure, multiple sites: Secondary | ICD-10-CM | POA: Diagnosis not present

## 2021-09-22 DIAGNOSIS — Z780 Asymptomatic menopausal state: Secondary | ICD-10-CM | POA: Diagnosis not present

## 2021-10-01 ENCOUNTER — Other Ambulatory Visit: Payer: Self-pay

## 2021-10-01 ENCOUNTER — Ambulatory Visit (INDEPENDENT_AMBULATORY_CARE_PROVIDER_SITE_OTHER): Payer: Medicare PPO

## 2021-10-01 VITALS — BP 110/70 | HR 69 | Temp 98.0°F | Ht 65.0 in | Wt 156.2 lb

## 2021-10-01 DIAGNOSIS — Z0000 Encounter for general adult medical examination without abnormal findings: Secondary | ICD-10-CM | POA: Diagnosis not present

## 2021-10-01 NOTE — Progress Notes (Addendum)
Subjective:   Joann Fields is a 71 y.o. female who presents for Medicare Annual (Subsequent) preventive examination.  Review of Systems     Cardiac Risk Factors include: advanced age (>51men, >64 women);dyslipidemia;family history of premature cardiovascular disease;hypertension     Objective:    Today's Vitals   10/01/21 0951  BP: 110/70  Pulse: 69  Temp: 98 F (36.7 C)  SpO2: 97%  Weight: 156 lb 3.2 oz (70.9 kg)  Height: 5\' 5"  (1.651 m)  PainSc: 0-No pain   Body mass index is 25.99 kg/m.  Advanced Directives 10/01/2021 09/20/2020 01/05/2019 11/08/2017 12/09/2016 11/25/2016 10/14/2016  Does Patient Have a Medical Advance Directive? No No No No No No No  Does patient want to make changes to medical advance directive? - - Yes (ED - Information included in AVS) - - - -  Would patient like information on creating a medical advance directive? Yes (MAU/Ambulatory/Procedural Areas - Information given) No - Patient declined - Yes (ED - Information included in AVS) - - No - Patient declined    Current Medications (verified) Outpatient Encounter Medications as of 10/01/2021  Medication Sig   azaTHIOprine (IMURAN) 50 MG tablet Take 100 mg by mouth daily.   cholecalciferol (VITAMIN D3) 25 MCG (1000 UNIT) tablet Take 1,000 Units by mouth daily.   Cyanocobalamin (VITAMIN B-12) 1000 MCG SUBL PLACE 1 TABLET UNDER THE TONGUE DAILY.   diltiazem (CARDIZEM CD) 180 MG 24 hr capsule TAKE 1 CAPSULE BY MOUTH EVERY DAY   pantoprazole (PROTONIX) 40 MG tablet TAKE 1 TABLET BY MOUTH TWICE A DAY (Patient not taking: Reported on 10/01/2021)   No facility-administered encounter medications on file as of 10/01/2021.    Allergies (verified) Amoxicillin   History: Past Medical History:  Diagnosis Date   Cataract    removed 2021   GERD (gastroesophageal reflux disease)    Hyperlipidemia    Hypertension    Osteopenia    Past Surgical History:  Procedure Laterality Date   CATARACT EXTRACTION, BILATERAL  Bilateral 2021   feb- right march-left   COLONOSCOPY  2018   DG THUMB LEFT HAND     cut her hand while prepping chicken for supper   POLYPECTOMY     SVD x 2     WISDOM TOOTH EXTRACTION     Family History  Problem Relation Age of Onset   Lung cancer Mother 15   Heart disease Father 75       MI   Alzheimer's disease Brother 12   Early death Maternal Aunt    Hearing loss Maternal Aunt    Lymphoma Sister    Colon cancer Neg Hx    Colon polyps Neg Hx    Esophageal cancer Neg Hx    Rectal cancer Neg Hx    Stomach cancer Neg Hx    Social History   Socioeconomic History   Marital status: Single    Spouse name: Not on file   Number of children: 2   Years of education: Not on file   Highest education level: Not on file  Occupational History   Occupation: Retired  Tobacco Use   Smoking status: Never   Smokeless tobacco: Never   Tobacco comments:    only in [employer] Use   Vaping Use: Never used  Substance and Sexual Activity   Alcohol use: Yes    Alcohol/week: 0.0 standard drinks    Comment: Rarely; once yearly   Drug use: No   Sexual activity: Not  Currently  Other Topics Concern   Not on file  Social History Narrative   Not on file   Social Determinants of Health   Financial Resource Strain: Low Risk    Difficulty of Paying Living Expenses: Not hard at all  Food Insecurity: No Food Insecurity   Worried About Charity fundraiser in the Last Year: Never true   [place] in the Last Year: Never true  Transportation Needs: No Transportation Needs   Lack of Transportation (Medical): No   Lack of Transportation (Non-Medical): No  Physical Activity: Sufficiently Active   Days of Exercise per Week: 5 days   Minutes of Exercise per Session: 30 min  Stress: No Stress Concern Present   Feeling of Stress : Not at all  Social Connections: Moderately Integrated   Frequency of Communication with Friends and Family: More than three times a week   Frequency of  Social Gatherings with Friends and Family: More than three times a week   Attends Religious Services: More than 4 times per year   Active Member of [employer] or Organizations: Yes   Attends Music therapist: More than 4 times per year   Marital Status: Never married    Tobacco Counseling Counseling given: Not Answered Tobacco comments: only in College   Clinical Intake:  Pre-visit preparation completed: Yes  Pain : No/denies pain Pain Score: 0-No pain     BMI - recorded: 25.99 Nutritional Status: BMI 25 -29 Overweight Nutritional Risks: None Diabetes: No  How often do you need to have someone help you when you read instructions, pamphlets, or other written materials from your doctor or pharmacy?: 1 - Never What is the last grade level you completed in school?: Master Degree  Diabetic? no  Interpreter Needed?: No  Information entered by :: Lisette Abu, LPN   Activities of Daily Living In your present state of health, do you have any difficulty performing the following activities: 10/01/2021  Hearing? N  Vision? N  Difficulty concentrating or making decisions? N  Walking or climbing stairs? N  Dressing or bathing? N  Doing errands, shopping? N  Preparing Food and eating ? N  Using the Toilet? N  In the past six months, have you accidently leaked urine? N  Do you have problems with loss of bowel control? N  Managing your Medications? N  Managing your Finances? N  Housekeeping or managing your Housekeeping? N  Some recent data might be hidden    Patient Care Team: Plotnikov, Evie Lacks, MD as PCP - General Brien Few, MD as Attending Physician (Obstetrics and Gynecology) Milus Banister, MD as Attending Physician (Gastroenterology) Feliz Beam, MD as Referring Physician (Ophthalmology) Darleen Crocker, MD as Consulting Physician (Ophthalmology) Marica Otter, OD as Consulting Physician (Optometry)  Indicate any recent Medical Services you  may have received from other than [facility] providers in the past year (date may be approximate).     Assessment:   This is a routine wellness examination for Joann Fields.  Hearing/Vision screen Hearing Screening - Comments:: Patient denied any hearing difficulty.   No hearing aids.  Vision Screening - Comments:: Patient wears corrective glasses/contacts.  Eye exam done annually by: Marica Otter, OD.  Dietary issues and exercise activities discussed: Current Exercise Habits: Home exercise routine, Type of exercise: walking, Time (Minutes): 30, Frequency (Times/Week): 5, Weekly Exercise (Minutes/Week): 150, Intensity: Moderate, Exercise limited by: orthopedic condition(s)   Goals Addressed  This Visit's Progress     DIET - INCREASE WATER INTAKE (pt-stated)        Complete my Advanced Directive Paperwork and increase my water intake.      Depression Screen PHQ 2/9 Scores 10/01/2021 01/27/2021 09/20/2020 01/05/2019 11/08/2017 10/14/2016 11/14/2014  PHQ - 2 Score 0 0 0 0 0 0 0    Fall Risk Fall Risk  10/01/2021 01/27/2021 09/20/2020 01/05/2019 11/08/2017  Falls in the past year? 0 0 0 0 No  Number falls in past yr: 0 0 0 0 -  Injury with Fall? 0 0 0 - -  Risk for fall due to : No Fall Risks - No Fall Risks - -  Follow up Falls evaluation completed - - - -    FALL RISK PREVENTION PERTAINING TO THE HOME:  Any stairs in or around the home? Yes  If so, are there any without handrails? No  Home free of loose throw rugs in walkways, pet beds, electrical cords, etc? Yes  Adequate lighting in your home to reduce risk of falls? Yes   ASSISTIVE DEVICES UTILIZED TO PREVENT FALLS:  Life alert? No  Use of a cane, walker or w/c? No  Grab bars in the bathroom? No  Shower chair or bench in shower? No  Elevated toilet seat or a handicapped toilet? No   TIMED UP AND GO:  Was the test performed? Yes .  Length of time to ambulate 10 feet: 6 sec.   Gait steady and fast without use of assistive  device  Cognitive Function: Normal cognitive status assessed by direct observation by this Nurse Health Advisor. No abnormalities found.       6CIT Screen 10/01/2021  What Year? 0 points  What month? 0 points  What time? 0 points  Count back from 20 0 points  Months in reverse 2 points  Repeat phrase 0 points  Total Score 2    Immunizations Immunization History  Administered Date(s) Administered   Influenza, High Dose Seasonal PF 10/14/2016, 11/08/2017, 06/29/2018, 06/29/2018, 07/08/2020, 06/24/2021   Influenza, Quadrivalent, Recombinant, Inj, Pf 06/23/2019   Influenza,inj,Quad PF,6+ Mos 09/12/2013, 09/24/2014, 09/27/2015   PFIZER Comirnaty(Gray Top)Covid-19 Tri-Sucrose Vaccine 02/05/2021   PFIZER(Purple Top)SARS-COV-2 Vaccination 09/29/2019, 10/20/2019, 06/27/2020   Pneumococcal Conjugate-13 10/14/2016   Pneumococcal Polysaccharide-23 11/08/2017   Td 05/14/2010   Tdap 07/29/2020   Zoster, Live 08/08/2012    TDAP status: Up to date  Flu Vaccine status: Up to date  Pneumococcal vaccine status: Up to date  Covid-19 vaccine status: Completed vaccines  Qualifies for Shingles Vaccine? Yes   Zostavax completed Yes   Shingrix Completed?: No.    Education has been provided regarding the importance of this vaccine. Patient has been advised to call insurance company to determine out of pocket expense if they have not yet received this vaccine. Advised may also receive vaccine at local pharmacy or Health Dept. Verbalized acceptance and understanding.  Screening Tests Health Maintenance  Topic Date Due   MAMMOGRAM  10/28/2019   COVID-19 Vaccine (5 - Booster for Pfizer series) 04/02/2021   Zoster Vaccines- Shingrix (2 of 2) 11/10/2021   COLONOSCOPY (Pts 45-87yrs Insurance coverage will need to be confirmed)  02/09/2023   TETANUS/TDAP  07/29/2030   Pneumonia Vaccine 20+ Years old  Completed   INFLUENZA VACCINE  Completed   DEXA SCAN  Completed   Hepatitis C Screening  Completed    HPV VACCINES  Aged Out    Health Maintenance  Health Maintenance Due  Topic  Date Due   MAMMOGRAM  10/28/2019   COVID-19 Vaccine (5 - Booster for Pfizer series) 04/02/2021    Colorectal cancer screening: Type of screening: Colonoscopy. Completed 02/09/2020. Repeat every 3 years  Mammogram status: Completed 2022. Repeat every year  Bone Density status: Completed 02/27/2019. Results reflect: Bone density results: OSTEOPENIA. Repeat every 2-3 years.  Lung Cancer Screening: (Low Dose CT Chest recommended if Age 23-80 years, 30 pack-year currently smoking OR have quit w/in 15years.) does not qualify.   Lung Cancer Screening Referral: no  Additional Screening:  Hepatitis C Screening: does qualify; Completed yes  Vision Screening: Recommended annual ophthalmology exams for early detection of glaucoma and other disorders of the eye. Is the patient up to date with their annual eye exam?  Yes  Who is the provider or what is the name of the office in which the patient attends annual eye exams? Marica Otter, OD. If pt is not established with a provider, would they like to be referred to a provider to establish care? No .   Dental Screening: Recommended annual dental exams for proper oral hygiene  Community Resource Referral / Chronic Care Management: CRR required this visit?  No   CCM required this visit?  No      Plan:     I have personally reviewed and noted the following in the patients chart:   Medical and social history Use of alcohol, tobacco or illicit drugs  Current medications and supplements including opioid prescriptions.  Functional ability and status Nutritional status Physical activity Advanced directives List of other physicians Hospitalizations, surgeries, and ER visits in previous 12 months Vitals Screenings to include cognitive, depression, and falls Referrals and appointments  In addition, I have reviewed and discussed with patient certain preventive  protocols, quality metrics, and best practice recommendations. A written personalized care plan for preventive services as well as general preventive health recommendations were provided to patient.     Sheral Flow, LPN   02/25/9448   Nurse Notes:  Hearing Screening - Comments:: Patient denied any hearing difficulty.   No hearing aids.  Vision Screening - Comments:: Patient wears corrective glasses/contacts.  Eye exam done annually by: Marica Otter, OD.    Medical screening examination/treatment/procedure(s) were performed by non-physician practitioner and as supervising physician I was immediately available for consultation/collaboration.  I agree with above. Lew Dawes, MD

## 2021-10-01 NOTE — Patient Instructions (Signed)
Joann Fields , Thank you for taking time to come for your Medicare Wellness Visit. I appreciate your ongoing commitment to your health goals. Please review the following plan we discussed and let me know if I can assist you in the future.   Screening recommendations/referrals: Colonoscopy: 02/09/2020; due every 3 years (due 02/09/2023) Mammogram: 10/28/2018; due every 1-2 years (due 10/27/2020): Dr. Ronita Hipps Bone Density: 02/27/2019; due every 2-3 years (due 02/26/2022): [facility] Recommended yearly ophthalmology/optometry visit for glaucoma screening and checkup Recommended yearly dental visit for hygiene and checkup  Vaccinations: Influenza vaccine: 06/24/2021; due every Fall season Pneumococcal vaccine: 10/14/2016, 11/08/2017 Tdap vaccine: 07/29/2020; due every 10 years (due 07/29/2030) Shingles vaccine: 09/15/2021; need second dose (due 11/10/2021)   Covid-19: 09/29/2019, 10/20/2019, 06/27/2020, 02/05/2021  Advanced directives: Advance directive discussed with you today. I have provided a copy for you to complete at home and have notarized. Once this is complete please bring a copy in to our office so we can scan it into your chart.  Conditions/risks identified: Yes; Client understands the importance of follow-up with providers by attending scheduled visits and discussed goals to eat healthier, increase physical activity, exercise the brain, socialize more, get enough sleep and make time for laughter.  Next appointment: 10/02/2022 at 10:00 a.m. office visit with Mignon Pine, Nurse Health Advisor. If need to reschedule or cancel please call [phone].   Preventive Care 50 Years and Older, Female Preventive care refers to lifestyle choices and visits with your health care provider that can promote health and wellness. What does preventive care include? A yearly physical exam. This is also called an annual well check. Dental exams once or twice a year. Routine eye exams. Ask your health care provider how  often you should have your eyes checked. Personal lifestyle choices, including: Daily care of your teeth and gums. Regular physical activity. Eating a healthy diet. Avoiding tobacco and drug use. Limiting alcohol use. Practicing safe sex. Taking low-dose aspirin every day. Taking vitamin and mineral supplements as recommended by your health care provider. What happens during an annual well check? The services and screenings done by your health care provider during your annual well check will depend on your age, overall health, lifestyle risk factors, and family history of disease. Counseling  Your health care provider may ask you questions about your: Alcohol use. Tobacco use. Drug use. Emotional well-being. Home and relationship well-being. Sexual activity. Eating habits. History of falls. Memory and ability to understand (cognition). Work and work Statistician. Reproductive health. Screening  You may have the following tests or measurements: Height, weight, and BMI. Blood pressure. Lipid and cholesterol levels. These may be checked every 5 years, or more frequently if you are over 21 years old. Skin check. Lung cancer screening. You may have this screening every year starting at age 18 if you have a 30-pack-year history of smoking and currently smoke or have quit within the past 15 years. Fecal occult blood test (FOBT) of the stool. You may have this test every year starting at age 66. Flexible sigmoidoscopy or colonoscopy. You may have a sigmoidoscopy every 5 years or a colonoscopy every 10 years starting at age 63. Hepatitis C blood test. Hepatitis B blood test. Sexually transmitted disease (STD) testing. Diabetes screening. This is done by checking your blood sugar (glucose) after you have not eaten for a while (fasting). You may have this done every 1-3 years. Bone density scan. This is done to screen for osteoporosis. You may have this done starting at  age 23. Mammogram.  This may be done every 1-2 years. Talk to your health care provider about how often you should have regular mammograms. Talk with your health care provider about your test results, treatment options, and if necessary, the need for more tests. Vaccines  Your health care provider may recommend certain vaccines, such as: Influenza vaccine. This is recommended every year. Tetanus, diphtheria, and acellular pertussis (Tdap, Td) vaccine. You may need a Td booster every 10 years. Zoster vaccine. You may need this after age 47. Pneumococcal 13-valent conjugate (PCV13) vaccine. One dose is recommended after age 58. Pneumococcal polysaccharide (PPSV23) vaccine. One dose is recommended after age 59. Talk to your health care provider about which screenings and vaccines you need and how often you need them. This information is not intended to replace advice given to you by your health care provider. Make sure you discuss any questions you have with your health care provider. Document Released: 09/06/2015 Document Revised: 04/29/2016 Document Reviewed: 06/11/2015 Elsevier Interactive Patient Education  2017 [employer] Prevention in the Home Falls can cause injuries. They can happen to people of all ages. There are many things you can do to make your home safe and to help prevent falls. What can I do on the outside of my home? Regularly fix the edges of walkways and driveways and fix any cracks. Remove anything that might make you trip as you walk through a door, such as a raised step or threshold. Trim any bushes or trees on the path to your home. Use bright outdoor lighting. Clear any walking paths of anything that might make someone trip, such as rocks or tools. Regularly check to see if handrails are loose or broken. Make sure that both sides of any steps have handrails. Any raised decks and porches should have guardrails on the edges. Have any leaves, snow, or ice cleared regularly. Use sand  or salt on walking paths during winter. Clean up any spills in your garage right away. This includes oil or grease spills. What can I do in the bathroom? Use night lights. Install grab bars by the toilet and in the tub and shower. Do not use towel bars as grab bars. Use non-skid mats or decals in the tub or shower. If you need to sit down in the shower, use a plastic, non-slip stool. Keep the floor dry. Clean up any water that spills on the floor as soon as it happens. Remove soap buildup in the tub or shower regularly. Attach bath mats securely with double-sided non-slip rug tape. Do not have throw rugs and other things on the floor that can make you trip. What can I do in the bedroom? Use night lights. Make sure that you have a light by your bed that is easy to reach. Do not use any sheets or blankets that are too big for your bed. They should not hang down onto the floor. Have a firm chair that has side arms. You can use this for support while you get dressed. Do not have throw rugs and other things on the floor that can make you trip. What can I do in the kitchen? Clean up any spills right away. Avoid walking on wet floors. Keep items that you use a lot in easy-to-reach places. If you need to reach something above you, use a strong step stool that has a grab bar. Keep electrical cords out of the way. Do not use floor polish or wax that makes floors  slippery. If you must use wax, use non-skid floor wax. Do not have throw rugs and other things on the floor that can make you trip. What can I do with my stairs? Do not leave any items on the stairs. Make sure that there are handrails on both sides of the stairs and use them. Fix handrails that are broken or loose. Make sure that handrails are as long as the stairways. Check any carpeting to make sure that it is firmly attached to the stairs. Fix any carpet that is loose or worn. Avoid having throw rugs at the top or bottom of the stairs.  If you do have throw rugs, attach them to the floor with carpet tape. Make sure that you have a light switch at the top of the stairs and the bottom of the stairs. If you do not have them, ask someone to add them for you. What else can I do to help prevent falls? Wear shoes that: Do not have high heels. Have rubber bottoms. Are comfortable and fit you well. Are closed at the toe. Do not wear sandals. If you use a stepladder: Make sure that it is fully opened. Do not climb a closed stepladder. Make sure that both sides of the stepladder are locked into place. Ask someone to hold it for you, if possible. Clearly mark and make sure that you can see: Any grab bars or handrails. First and last steps. Where the edge of each step is. Use tools that help you move around (mobility aids) if they are needed. These include: Canes. Walkers. Scooters. Crutches. Turn on the lights when you go into a dark area. Replace any light bulbs as soon as they burn out. Set up your furniture so you have a clear path. Avoid moving your furniture around. If any of your floors are uneven, fix them. If there are any pets around you, be aware of where they are. Review your medicines with your doctor. Some medicines can make you feel dizzy. This can increase your chance of falling. Ask your doctor what other things that you can do to help prevent falls. This information is not intended to replace advice given to you by your health care provider. Make sure you discuss any questions you have with your health care provider. Document Released: 06/06/2009 Document Revised: 01/16/2016 Document Reviewed: 09/14/2014 Elsevier Interactive Patient Education  2017 [employer].

## 2021-10-22 ENCOUNTER — Telehealth: Payer: Self-pay

## 2021-10-22 NOTE — Telephone Encounter (Signed)
Pt is requesting a call back to know if she should start taking calcium from the results of her Bone density scan. ? ?Pt CB [phone] ?

## 2021-10-23 NOTE — Telephone Encounter (Signed)
Joann Fields needs to take vitamin D, eat calcium rich foods, do core strengthening exercises.  Thank you ?

## 2021-10-24 NOTE — Telephone Encounter (Signed)
Pt has been informed and expressed understanding.  

## 2021-11-17 DIAGNOSIS — L819 Disorder of pigmentation, unspecified: Secondary | ICD-10-CM | POA: Diagnosis not present

## 2021-11-17 DIAGNOSIS — Z79899 Other long term (current) drug therapy: Secondary | ICD-10-CM | POA: Diagnosis not present

## 2021-11-17 DIAGNOSIS — L438 Other lichen planus: Secondary | ICD-10-CM | POA: Diagnosis not present

## 2021-12-09 DIAGNOSIS — H30033 Focal chorioretinal inflammation, peripheral, bilateral: Secondary | ICD-10-CM | POA: Diagnosis not present

## 2021-12-09 DIAGNOSIS — H35033 Hypertensive retinopathy, bilateral: Secondary | ICD-10-CM | POA: Diagnosis not present

## 2021-12-09 DIAGNOSIS — Z79899 Other long term (current) drug therapy: Secondary | ICD-10-CM | POA: Diagnosis not present

## 2021-12-09 DIAGNOSIS — Z961 Presence of intraocular lens: Secondary | ICD-10-CM | POA: Diagnosis not present

## 2021-12-10 ENCOUNTER — Other Ambulatory Visit: Payer: Self-pay | Admitting: Internal Medicine

## 2021-12-11 DIAGNOSIS — H30033 Focal chorioretinal inflammation, peripheral, bilateral: Secondary | ICD-10-CM | POA: Diagnosis not present

## 2021-12-11 DIAGNOSIS — Z79899 Other long term (current) drug therapy: Secondary | ICD-10-CM | POA: Diagnosis not present

## 2022-01-29 ENCOUNTER — Encounter: Payer: Medicare PPO | Admitting: Internal Medicine

## 2022-02-16 ENCOUNTER — Ambulatory Visit (INDEPENDENT_AMBULATORY_CARE_PROVIDER_SITE_OTHER): Payer: Medicare PPO | Admitting: Internal Medicine

## 2022-02-16 ENCOUNTER — Encounter: Payer: Self-pay | Admitting: Internal Medicine

## 2022-02-16 VITALS — BP 130/68 | HR 74 | Temp 97.9°F | Ht 65.0 in | Wt 160.0 lb

## 2022-02-16 DIAGNOSIS — Z0000 Encounter for general adult medical examination without abnormal findings: Secondary | ICD-10-CM | POA: Diagnosis not present

## 2022-02-16 DIAGNOSIS — N183 Chronic kidney disease, stage 3 unspecified: Secondary | ICD-10-CM | POA: Diagnosis not present

## 2022-02-16 DIAGNOSIS — L819 Disorder of pigmentation, unspecified: Secondary | ICD-10-CM

## 2022-02-16 DIAGNOSIS — L83 Acanthosis nigricans: Secondary | ICD-10-CM | POA: Diagnosis not present

## 2022-02-16 DIAGNOSIS — I10 Essential (primary) hypertension: Secondary | ICD-10-CM

## 2022-02-16 DIAGNOSIS — R739 Hyperglycemia, unspecified: Secondary | ICD-10-CM | POA: Diagnosis not present

## 2022-02-16 DIAGNOSIS — E785 Hyperlipidemia, unspecified: Secondary | ICD-10-CM

## 2022-02-23 ENCOUNTER — Other Ambulatory Visit (INDEPENDENT_AMBULATORY_CARE_PROVIDER_SITE_OTHER): Payer: Medicare PPO

## 2022-02-23 DIAGNOSIS — R739 Hyperglycemia, unspecified: Secondary | ICD-10-CM

## 2022-02-23 DIAGNOSIS — Z0000 Encounter for general adult medical examination without abnormal findings: Secondary | ICD-10-CM

## 2022-02-23 LAB — URINALYSIS, ROUTINE W REFLEX MICROSCOPIC
Bilirubin Urine: NEGATIVE
Ketones, ur: NEGATIVE
Nitrite: NEGATIVE
RBC / HPF: NONE SEEN (ref 0–?)
Specific Gravity, Urine: 1.025 (ref 1.000–1.030)
Total Protein, Urine: NEGATIVE
Urine Glucose: NEGATIVE
Urobilinogen, UA: 0.2 (ref 0.0–1.0)
pH: 5.5 (ref 5.0–8.0)

## 2022-02-23 LAB — LIPID PANEL
Cholesterol: 218 mg/dL — ABNORMAL HIGH (ref 0–200)
HDL: 57.3 mg/dL (ref >39.00)
LDL Cholesterol: 144 mg/dL — ABNORMAL HIGH (ref 0–99)
NonHDL: 161.15
Total CHOL/HDL Ratio: 4
Triglycerides: 87 mg/dL (ref 0.0–149.0)
VLDL: 17.4 mg/dL (ref 0.0–40.0)

## 2022-02-23 LAB — TSH: TSH: 1.45 u[IU]/mL (ref 0.35–5.50)

## 2022-02-23 LAB — HEMOGLOBIN A1C: Hgb A1c MFr Bld: 5.6 % (ref 4.6–6.5)

## 2022-03-10 ENCOUNTER — Other Ambulatory Visit: Payer: Self-pay | Admitting: Internal Medicine

## 2022-03-17 DIAGNOSIS — Z961 Presence of intraocular lens: Secondary | ICD-10-CM | POA: Diagnosis not present

## 2022-03-17 DIAGNOSIS — H30033 Focal chorioretinal inflammation, peripheral, bilateral: Secondary | ICD-10-CM | POA: Diagnosis not present

## 2022-03-17 DIAGNOSIS — Z79899 Other long term (current) drug therapy: Secondary | ICD-10-CM | POA: Diagnosis not present

## 2022-03-17 DIAGNOSIS — H35033 Hypertensive retinopathy, bilateral: Secondary | ICD-10-CM | POA: Diagnosis not present

## 2022-03-19 DIAGNOSIS — Z1231 Encounter for screening mammogram for malignant neoplasm of breast: Secondary | ICD-10-CM | POA: Diagnosis not present

## 2022-03-19 DIAGNOSIS — I10 Essential (primary) hypertension: Secondary | ICD-10-CM | POA: Diagnosis not present

## 2022-03-19 DIAGNOSIS — Z124 Encounter for screening for malignant neoplasm of cervix: Secondary | ICD-10-CM | POA: Diagnosis not present

## 2022-03-19 DIAGNOSIS — Z6826 Body mass index (BMI) 26.0-26.9, adult: Secondary | ICD-10-CM | POA: Diagnosis not present

## 2022-03-19 DIAGNOSIS — Z01411 Encounter for gynecological examination (general) (routine) with abnormal findings: Secondary | ICD-10-CM | POA: Diagnosis not present

## 2022-03-19 DIAGNOSIS — M8580 Other specified disorders of bone density and structure, unspecified site: Secondary | ICD-10-CM | POA: Diagnosis not present

## 2022-03-19 DIAGNOSIS — Z01419 Encounter for gynecological examination (general) (routine) without abnormal findings: Secondary | ICD-10-CM | POA: Diagnosis not present

## 2022-03-19 DIAGNOSIS — Z0142 Encounter for cervical smear to confirm findings of recent normal smear following initial abnormal smear: Secondary | ICD-10-CM | POA: Diagnosis not present

## 2022-03-20 DIAGNOSIS — Z79899 Other long term (current) drug therapy: Secondary | ICD-10-CM | POA: Diagnosis not present

## 2022-04-13 DIAGNOSIS — I129 Hypertensive chronic kidney disease with stage 1 through stage 4 chronic kidney disease, or unspecified chronic kidney disease: Secondary | ICD-10-CM | POA: Diagnosis not present

## 2022-04-13 DIAGNOSIS — Z811 Family history of alcohol abuse and dependence: Secondary | ICD-10-CM | POA: Diagnosis not present

## 2022-04-13 DIAGNOSIS — N182 Chronic kidney disease, stage 2 (mild): Secondary | ICD-10-CM | POA: Diagnosis not present

## 2022-04-13 DIAGNOSIS — E785 Hyperlipidemia, unspecified: Secondary | ICD-10-CM | POA: Diagnosis not present

## 2022-04-13 DIAGNOSIS — Z809 Family history of malignant neoplasm, unspecified: Secondary | ICD-10-CM | POA: Diagnosis not present

## 2022-04-13 DIAGNOSIS — M8580 Other specified disorders of bone density and structure, unspecified site: Secondary | ICD-10-CM | POA: Diagnosis not present

## 2022-04-13 DIAGNOSIS — E538 Deficiency of other specified B group vitamins: Secondary | ICD-10-CM | POA: Diagnosis not present

## 2022-04-13 DIAGNOSIS — R32 Unspecified urinary incontinence: Secondary | ICD-10-CM | POA: Diagnosis not present

## 2022-04-13 DIAGNOSIS — G8929 Other chronic pain: Secondary | ICD-10-CM | POA: Diagnosis not present

## 2022-04-20 DIAGNOSIS — L439 Lichen planus, unspecified: Secondary | ICD-10-CM | POA: Diagnosis not present

## 2022-04-20 DIAGNOSIS — L819 Disorder of pigmentation, unspecified: Secondary | ICD-10-CM | POA: Diagnosis not present

## 2022-04-20 DIAGNOSIS — L814 Other melanin hyperpigmentation: Secondary | ICD-10-CM | POA: Diagnosis not present

## 2022-04-20 DIAGNOSIS — L438 Other lichen planus: Secondary | ICD-10-CM | POA: Diagnosis not present

## 2022-04-20 DIAGNOSIS — D489 Neoplasm of uncertain behavior, unspecified: Secondary | ICD-10-CM | POA: Diagnosis not present

## 2022-04-28 DIAGNOSIS — Z4802 Encounter for removal of sutures: Secondary | ICD-10-CM | POA: Diagnosis not present

## 2022-06-02 ENCOUNTER — Telehealth: Payer: Self-pay | Admitting: Internal Medicine

## 2022-06-02 DIAGNOSIS — L819 Disorder of pigmentation, unspecified: Secondary | ICD-10-CM

## 2022-06-02 NOTE — Telephone Encounter (Signed)
Pt called and wanted Dr. Parks Neptune advice. She wanted to know if he would recommend her getting the new COVID vaccine. She would like a callback  Her best callback # is [phone]

## 2022-06-03 NOTE — Telephone Encounter (Signed)
It is a hard question. Joann Fields should make her decision on the basis of her own views on the vaccine risks and benefits, family and friends pressure etc. Personally, I will not get vaccinated. Thank you

## 2022-06-03 NOTE — Telephone Encounter (Signed)
Called pt back w/ MD response. Pt states she want to know can she go somewhere for second opinion. He saw her face and when they did the biopsy she was dx w/ skin disorder " Lichen planus. She been going to [facility]. She states it should be in her chart.Marland KitchenJohny Chess

## 2022-06-04 NOTE — Telephone Encounter (Signed)
Does Margree have a preference where to be referred for the second opinion?  Thanks

## 2022-06-04 NOTE — Telephone Encounter (Signed)
Called pt gave her MD response. Pt states no she doesn't know any office. Whomever MD want to refer me too.Marland KitchenJohny Chess

## 2022-06-07 NOTE — Telephone Encounter (Signed)
Okay.  Thanks.

## 2022-06-08 ENCOUNTER — Telehealth: Payer: Self-pay | Admitting: Internal Medicine

## 2022-06-08 DIAGNOSIS — L819 Disorder of pigmentation, unspecified: Secondary | ICD-10-CM

## 2022-06-08 NOTE — Telephone Encounter (Signed)
Patient does not want the referral to [facility] - she would rather be referred to a specialist at [facility] - Please advise.

## 2022-06-09 ENCOUNTER — Other Ambulatory Visit: Payer: Self-pay | Admitting: *Deleted

## 2022-06-09 NOTE — Telephone Encounter (Signed)
Place referral for [facility].Marland KitchenJohny Fields

## 2022-06-09 NOTE — Telephone Encounter (Signed)
Okay.  Please change to [facility] dermatology.  Thank you

## 2022-06-24 NOTE — Telephone Encounter (Signed)
Patient has still not heard anything on the [facility] Referral - please call patient and let her know if this has been placed.

## 2022-06-25 NOTE — Telephone Encounter (Signed)
Called pt inform referral was sent to [facility]. Gave her [facility] # to call and check status,,,/lmb

## 2022-06-25 NOTE — Telephone Encounter (Signed)
Notified pt per chart referral was faxed over to Department of Dermatology [facility] [address], [place] [address] [phone] (P)

## 2022-07-21 DIAGNOSIS — Z79899 Other long term (current) drug therapy: Secondary | ICD-10-CM | POA: Diagnosis not present

## 2022-07-21 DIAGNOSIS — H35033 Hypertensive retinopathy, bilateral: Secondary | ICD-10-CM | POA: Diagnosis not present

## 2022-07-21 DIAGNOSIS — Z961 Presence of intraocular lens: Secondary | ICD-10-CM | POA: Diagnosis not present

## 2022-07-21 DIAGNOSIS — H30033 Focal chorioretinal inflammation, peripheral, bilateral: Secondary | ICD-10-CM | POA: Diagnosis not present

## 2022-07-22 DIAGNOSIS — H30033 Focal chorioretinal inflammation, peripheral, bilateral: Secondary | ICD-10-CM | POA: Diagnosis not present

## 2022-07-22 DIAGNOSIS — Z79899 Other long term (current) drug therapy: Secondary | ICD-10-CM | POA: Diagnosis not present

## 2022-07-29 DIAGNOSIS — Z5181 Encounter for therapeutic drug level monitoring: Secondary | ICD-10-CM | POA: Diagnosis not present

## 2022-07-29 DIAGNOSIS — L438 Other lichen planus: Secondary | ICD-10-CM | POA: Diagnosis not present

## 2022-07-29 DIAGNOSIS — Z79621 Long term (current) use of calcineurin inhibitor: Secondary | ICD-10-CM | POA: Diagnosis not present

## 2022-07-29 DIAGNOSIS — Z79899 Other long term (current) drug therapy: Secondary | ICD-10-CM | POA: Diagnosis not present

## 2022-09-02 ENCOUNTER — Other Ambulatory Visit: Payer: Self-pay | Admitting: Internal Medicine

## 2022-09-16 DIAGNOSIS — H52223 Regular astigmatism, bilateral: Secondary | ICD-10-CM | POA: Diagnosis not present

## 2022-09-16 DIAGNOSIS — H524 Presbyopia: Secondary | ICD-10-CM | POA: Diagnosis not present

## 2022-09-16 DIAGNOSIS — Z961 Presence of intraocular lens: Secondary | ICD-10-CM | POA: Diagnosis not present

## 2022-09-16 DIAGNOSIS — Z9849 Cataract extraction status, unspecified eye: Secondary | ICD-10-CM | POA: Diagnosis not present

## 2022-09-16 DIAGNOSIS — H53143 Visual discomfort, bilateral: Secondary | ICD-10-CM | POA: Diagnosis not present

## 2022-09-16 DIAGNOSIS — H5212 Myopia, left eye: Secondary | ICD-10-CM | POA: Diagnosis not present

## 2022-09-16 DIAGNOSIS — H5201 Hypermetropia, right eye: Secondary | ICD-10-CM | POA: Diagnosis not present

## 2022-10-02 ENCOUNTER — Ambulatory Visit (INDEPENDENT_AMBULATORY_CARE_PROVIDER_SITE_OTHER): Payer: Medicare PPO

## 2022-10-02 VITALS — BP 120/60 | HR 74 | Temp 97.4°F | Ht 65.0 in | Wt 158.6 lb

## 2022-10-02 DIAGNOSIS — Z0000 Encounter for general adult medical examination without abnormal findings: Secondary | ICD-10-CM

## 2022-10-02 NOTE — Patient Instructions (Signed)
Joann Fields , Thank you for taking time to come for your Medicare Wellness Visit. I appreciate your ongoing commitment to your health goals. Please review the following plan we discussed and let me know if I can assist you in the future.   These are the goals we discussed:  Goals      My healthcare goal for 2024 is to maintain my current health status by continuing to eat healthy, stay independent, physically and socially active.        This is a list of the screening recommended for you and due dates:  Health Maintenance  Topic Date Due   COVID-19 Vaccine (7 - 2023-24 season) 10/21/2022   Colon Cancer Screening  02/09/2023   Mammogram  03/20/2023   Medicare Annual Wellness Visit  10/03/2023   DTaP/Tdap/Td vaccine (3 - Td or Tdap) 07/29/2030   Pneumonia Vaccine  Completed   Flu Shot  Completed   DEXA scan (bone density measurement)  Completed   Hepatitis C Screening: USPSTF Recommendation to screen - Ages 50-79 yo.  Completed   Zoster (Shingles) Vaccine  Completed   HPV Vaccine  Aged Out    Advanced directives: No; Advance directive discussed with you today. I have provided a copy for you to complete at home and have notarized. Once this is complete please bring a copy in to our office so we can scan it into your chart.  Conditions/risks identified: Yes  Next appointment: Follow up in one year for your annual wellness visit.   Preventive Care 74 Years and Older, Female Preventive care refers to lifestyle choices and visits with your health care provider that can promote health and wellness. What does preventive care include? A yearly physical exam. This is also called an annual well check. Dental exams once or twice a year. Routine eye exams. Ask your health care provider how often you should have your eyes checked. Personal lifestyle choices, including: Daily care of your teeth and gums. Regular physical activity. Eating a healthy diet. Avoiding tobacco and drug  use. Limiting alcohol use. Practicing safe sex. Taking low-dose aspirin every day. Taking vitamin and mineral supplements as recommended by your health care provider. What happens during an annual well check? The services and screenings done by your health care provider during your annual well check will depend on your age, overall health, lifestyle risk factors, and family history of disease. Counseling  Your health care provider may ask you questions about your: Alcohol use. Tobacco use. Drug use. Emotional well-being. Home and relationship well-being. Sexual activity. Eating habits. History of falls. Memory and ability to understand (cognition). Work and work Statistician. Reproductive health. Screening  You may have the following tests or measurements: Height, weight, and BMI. Blood pressure. Lipid and cholesterol levels. These may be checked every 5 years, or more frequently if you are over 74 years old. Skin check. Lung cancer screening. You may have this screening every year starting at age 28 if you have a 30-pack-year history of smoking and currently smoke or have quit within the past 15 years. Fecal occult blood test (FOBT) of the stool. You may have this test every year starting at age 6. Flexible sigmoidoscopy or colonoscopy. You may have a sigmoidoscopy every 5 years or a colonoscopy every 10 years starting at age 25. Hepatitis C blood test. Hepatitis B blood test. Sexually transmitted disease (STD) testing. Diabetes screening. This is done by checking your blood sugar (glucose) after you have not eaten for a while (  fasting). You may have this done every 1-3 years. Bone density scan. This is done to screen for osteoporosis. You may have this done starting at age 85. Mammogram. This may be done every 1-2 years. Talk to your health care provider about how often you should have regular mammograms. Talk with your health care provider about your test results, treatment  options, and if necessary, the need for more tests. Vaccines  Your health care provider may recommend certain vaccines, such as: Influenza vaccine. This is recommended every year. Tetanus, diphtheria, and acellular pertussis (Tdap, Td) vaccine. You may need a Td booster every 10 years. Zoster vaccine. You may need this after age 37. Pneumococcal 13-valent conjugate (PCV13) vaccine. One dose is recommended after age 36. Pneumococcal polysaccharide (PPSV23) vaccine. One dose is recommended after age 56. Talk to your health care provider about which screenings and vaccines you need and how often you need them. This information is not intended to replace advice given to you by your health care provider. Make sure you discuss any questions you have with your health care provider. Document Released: 09/06/2015 Document Revised: 04/29/2016 Document Reviewed: 06/11/2015 Elsevier Interactive Patient Education  2017 [employer] Prevention in the Home Falls can cause injuries. They can happen to people of all ages. There are many things you can do to make your home safe and to help prevent falls. What can I do on the outside of my home? Regularly fix the edges of walkways and driveways and fix any cracks. Remove anything that might make you trip as you walk through a door, such as a raised step or threshold. Trim any bushes or trees on the path to your home. Use bright outdoor lighting. Clear any walking paths of anything that might make someone trip, such as rocks or tools. Regularly check to see if handrails are loose or broken. Make sure that both sides of any steps have handrails. Any raised decks and porches should have guardrails on the edges. Have any leaves, snow, or ice cleared regularly. Use sand or salt on walking paths during winter. Clean up any spills in your garage right away. This includes oil or grease spills. What can I do in the bathroom? Use night lights. Install grab  bars by the toilet and in the tub and shower. Do not use towel bars as grab bars. Use non-skid mats or decals in the tub or shower. If you need to sit down in the shower, use a plastic, non-slip stool. Keep the floor dry. Clean up any water that spills on the floor as soon as it happens. Remove soap buildup in the tub or shower regularly. Attach bath mats securely with double-sided non-slip rug tape. Do not have throw rugs and other things on the floor that can make you trip. What can I do in the bedroom? Use night lights. Make sure that you have a light by your bed that is easy to reach. Do not use any sheets or blankets that are too big for your bed. They should not hang down onto the floor. Have a firm chair that has side arms. You can use this for support while you get dressed. Do not have throw rugs and other things on the floor that can make you trip. What can I do in the kitchen? Clean up any spills right away. Avoid walking on wet floors. Keep items that you use a lot in easy-to-reach places. If you need to reach something above you, use  a strong step stool that has a grab bar. Keep electrical cords out of the way. Do not use floor polish or wax that makes floors slippery. If you must use wax, use non-skid floor wax. Do not have throw rugs and other things on the floor that can make you trip. What can I do with my stairs? Do not leave any items on the stairs. Make sure that there are handrails on both sides of the stairs and use them. Fix handrails that are broken or loose. Make sure that handrails are as long as the stairways. Check any carpeting to make sure that it is firmly attached to the stairs. Fix any carpet that is loose or worn. Avoid having throw rugs at the top or bottom of the stairs. If you do have throw rugs, attach them to the floor with carpet tape. Make sure that you have a light switch at the top of the stairs and the bottom of the stairs. If you do not have them,  ask someone to add them for you. What else can I do to help prevent falls? Wear shoes that: Do not have high heels. Have rubber bottoms. Are comfortable and fit you well. Are closed at the toe. Do not wear sandals. If you use a stepladder: Make sure that it is fully opened. Do not climb a closed stepladder. Make sure that both sides of the stepladder are locked into place. Ask someone to hold it for you, if possible. Clearly mark and make sure that you can see: Any grab bars or handrails. First and last steps. Where the edge of each step is. Use tools that help you move around (mobility aids) if they are needed. These include: Canes. Walkers. Scooters. Crutches. Turn on the lights when you go into a dark area. Replace any light bulbs as soon as they burn out. Set up your furniture so you have a clear path. Avoid moving your furniture around. If any of your floors are uneven, fix them. If there are any pets around you, be aware of where they are. Review your medicines with your doctor. Some medicines can make you feel dizzy. This can increase your chance of falling. Ask your doctor what other things that you can do to help prevent falls. This information is not intended to replace advice given to you by your health care provider. Make sure you discuss any questions you have with your health care provider. Document Released: 06/06/2009 Document Revised: 01/16/2016 Document Reviewed: 09/14/2014 Elsevier Interactive Patient Education  2017 [employer].

## 2022-10-02 NOTE — Progress Notes (Addendum)
Subjective:   Joann Fields is a 72 y.o. female who presents for Medicare Annual (Subsequent) preventive examination.  Review of Systems     Cardiac Risk Factors include: advanced age (>73mn, >>59women);dyslipidemia;family history of premature cardiovascular disease;hypertension     Objective:    Today's Vitals   10/02/22 1013  BP: 120/60  Pulse: 74  Temp: (!) 97.4 F (36.3 C)  SpO2: 97%  Weight: 158 lb 9.6 oz (71.9 kg)  Height: 5' 5"$  (1.651 m)  PainSc: 0-No pain   Body mass index is 26.39 kg/m.     10/02/2022   10:42 AM 10/01/2021   10:26 AM 09/20/2020   11:05 AM 01/05/2019   10:12 AM 11/08/2017    9:44 AM 12/09/2016    9:39 AM 11/25/2016    9:03 AM  Advanced Directives  Does Patient Have a Medical Advance Directive? No No No No No No No  Does patient want to make changes to medical advance directive?    Yes (ED - Information included in AVS)     Would patient like information on creating a medical advance directive? Yes (MAU/Ambulatory/Procedural Areas - Information given) Yes (MAU/Ambulatory/Procedural Areas - Information given) No - Patient declined  Yes (ED - Information included in AVS)      Current Medications (verified) Outpatient Encounter Medications as of 10/02/2022  Medication Sig   azaTHIOprine (IMURAN) 50 MG tablet Take 100 mg by mouth daily.   cholecalciferol (VITAMIN D3) 25 MCG (1000 UNIT) tablet Take 1,000 Units by mouth daily.   Cyanocobalamin (VITAMIN B-12) 1000 MCG SUBL PLACE 1 TABLET UNDER THE TONGUE DAILY.   Difluprednate 0.05 % EMUL SMARTSIG:1 Drop(s) In Eye(s) Twice Daily PRN   diltiazem (CARDIZEM CD) 180 MG 24 hr capsule TAKE 1 CAPSULE BY MOUTH EVERY DAY   hydroxychloroquine (PLAQUENIL) 200 MG tablet Take 200 mg by mouth daily.   tacrolimus (PROTOPIC) 0.03 % ointment Apply topically 2 (two) times daily.   pantoprazole (PROTONIX) 40 MG tablet TAKE 1 TABLET BY MOUTH TWICE A DAY (Patient not taking: Reported on 10/01/2021)   No facility-administered  encounter medications on file as of 10/02/2022.    Allergies (verified) Amoxicillin   History: Past Medical History:  Diagnosis Date   Cataract    removed 2021   GERD (gastroesophageal reflux disease)    Hyperlipidemia    Hypertension    Osteopenia    Past Surgical History:  Procedure Laterality Date   CATARACT EXTRACTION, BILATERAL Bilateral 2021   feb- right march-left   COLONOSCOPY  2018   DG THUMB LEFT HAND     cut her hand while prepping chicken for supper   POLYPECTOMY     SVD x 2     WISDOM TOOTH EXTRACTION     Family History  Problem Relation Age of Onset   Lung cancer Mother 550  Heart disease Father 525      MI   Alzheimer's disease Brother 630  Early death Maternal Aunt    Hearing loss Maternal Aunt    Lymphoma Sister    Colon cancer Neg Hx    Colon polyps Neg Hx    Esophageal cancer Neg Hx    Rectal cancer Neg Hx    Stomach cancer Neg Hx    Social History   Socioeconomic History   Marital status: Single    Spouse name: Not on file   Number of children: 2   Years of education: Not on file   Highest education  level: Not on file  Occupational History   Occupation: Retired  Tobacco Use   Smoking status: Never   Smokeless tobacco: Never   Tobacco comments:    only in [employer] Use   Vaping Use: Never used  Substance and Sexual Activity   Alcohol use: Yes    Alcohol/week: 0.0 standard drinks of alcohol    Comment: Rarely; once yearly   Drug use: No   Sexual activity: Not Currently  Other Topics Concern   Not on file  Social History Narrative   Not on file   Social Determinants of Health   Financial Resource Strain: Low Risk  (10/02/2022)   Overall Financial Resource Strain (CARDIA)    Difficulty of Paying Living Expenses: Not hard at all  Food Insecurity: No Food Insecurity (10/02/2022)   Hunger Vital Sign    Worried About Running Out of Food in the Last Year: Never true    Ran Out of Food in the Last Year: Never true   Transportation Needs: No Transportation Needs (10/02/2022)   PRAPARE - Hydrologist (Medical): No    Lack of Transportation (Non-Medical): No  Physical Activity: Sufficiently Active (10/02/2022)   Exercise Vital Sign    Days of Exercise per Week: 5 days    Minutes of Exercise per Session: 30 min  Stress: No Stress Concern Present (10/02/2022)   [place]    Feeling of Stress : Not at all  Social Connections: Moderately Integrated (10/02/2022)   Social Connection and Isolation Panel [NHANES]    Frequency of Communication with Friends and Family: More than three times a week    Frequency of Social Gatherings with Friends and Family: More than three times a week    Attends Religious Services: More than 4 times per year    Active Member of [employer] or Organizations: Yes    Attends Music therapist: More than 4 times per year    Marital Status: Never married    Tobacco Counseling Counseling given: Not Answered Tobacco comments: only in College   Clinical Intake:  Pre-visit preparation completed: Yes  Pain : No/denies pain Pain Score: 0-No pain     Nutritional Risks: None Diabetes: No  How often do you need to have someone help you when you read instructions, pamphlets, or other written materials from your doctor or pharmacy?: 1 - Never What is the last grade level you completed in school?: HSG  Diabetic? no  Interpreter Needed?: No  Information entered by :: Lisette Abu, LPN.   Activities of Daily Living    10/02/2022   10:15 AM  In your present state of health, do you have any difficulty performing the following activities:  Hearing? 0  Vision? 0  Difficulty concentrating or making decisions? 0  Walking or climbing stairs? 0  Dressing or bathing? 0  Doing errands, shopping? 0  Preparing Food and eating ? N  Using the Toilet? N  In the past six months, have you  accidently leaked urine? N  Do you have problems with loss of bowel control? N  Managing your Medications? N  Managing your Finances? N  Housekeeping or managing your Housekeeping? N    Patient Care Team: Plotnikov, Evie Lacks, MD as PCP - Sondra Barges, MD as Attending Physician (Obstetrics and Gynecology) Milus Banister, MD as Attending Physician (Gastroenterology) Feliz Beam, MD as Referring Physician (Ophthalmology) Marica Otter, OD as  Consulting Physician (Optometry) Leonie Green, Flonnie Hailstone, MD as Consulting Physician (Dermatology)  Indicate any recent Medical Services you may have received from other than [facility] providers in the past year (date may be approximate).     Assessment:   This is a routine wellness examination for Madalyne.  Hearing/Vision screen Hearing Screening - Comments:: Denies hearing difficulties   Vision Screening - Comments:: Wears rx glasses - up to date with routine eye exams with    Dietary issues and exercise activities discussed: Current Exercise Habits: Home exercise routine, Type of exercise: walking;treadmill;stretching;strength training/weights;exercise ball;calisthenics, Time (Minutes): 30, Frequency (Times/Week): 5, Weekly Exercise (Minutes/Week): 150, Intensity: Moderate, Exercise limited by: orthopedic condition(s)   Goals Addressed             This Visit's Progress    My healthcare goal for 2024 is to maintain my current health status by continuing to eat healthy, stay independent, physically and socially active.        Depression Screen    10/02/2022   10:14 AM 10/01/2021   10:28 AM 01/27/2021    9:14 AM 09/20/2020   11:04 AM 01/05/2019   10:13 AM 11/08/2017    8:57 AM 10/14/2016    9:52 AM  PHQ 2/9 Scores  PHQ - 2 Score 0 0 0 0 0 0 0    Fall Risk    10/02/2022   10:15 AM 10/01/2021   10:28 AM 01/27/2021    9:14 AM 09/20/2020   11:05 AM 01/05/2019   10:13 AM  Fall Risk   Falls in the past year? 0 0 0 0 0  Number falls in past  yr: 0 0 0 0 0  Injury with Fall? 0 0 0 0   Risk for fall due to : No Fall Risks No Fall Risks  No Fall Risks   Follow up Falls prevention discussed Falls evaluation completed       FALL RISK PREVENTION PERTAINING TO THE HOME:  Any stairs in or around the home? Yes  If so, are there any without handrails? No  Home free of loose throw rugs in walkways, pet beds, electrical cords, etc? Yes  Adequate lighting in your home to reduce risk of falls? Yes   ASSISTIVE DEVICES UTILIZED TO PREVENT FALLS:  Life alert? No  Use of a cane, walker or w/c? No  Grab bars in the bathroom? No  Shower chair or bench in shower? No  Elevated toilet seat or a handicapped toilet? No   TIMED UP AND GO:  Was the test performed? Yes .  Length of time to ambulate 10 feet: 8 sec.   Gait steady and fast without use of assistive device  Cognitive Function:        10/02/2022   10:15 AM 10/01/2021   10:30 AM  6CIT Screen  What Year? 0 points 0 points  What month? 0 points 0 points  What time? 0 points 0 points  Count back from 20 0 points 0 points  Months in reverse 0 points 2 points  Repeat phrase 0 points 0 points  Total Score 0 points 2 points    Immunizations Immunization History  Administered Date(s) Administered   Influenza, High Dose Seasonal PF 10/14/2016, 11/08/2017, 06/29/2018, 06/29/2018, 07/08/2020, 06/24/2021   Influenza, Quadrivalent, Recombinant, Inj, Pf 06/23/2019   Influenza,inj,Quad PF,6+ Mos 09/12/2013, 09/24/2014, 09/27/2015   Influenza-Unspecified 07/21/2022   PFIZER Comirnaty(Gray Top)Covid-19 Tri-Sucrose Vaccine 02/05/2021, 08/26/2022   PFIZER(Purple Top)SARS-COV-2 Vaccination 09/29/2019, 10/20/2019, 06/27/2020, 06/11/2021   Pneumococcal Conjugate-13  10/14/2016   Pneumococcal Polysaccharide-23 11/08/2017   Td 05/14/2010   Tdap 07/29/2020   Zoster Recombinat (Shingrix) 01/02/2022   Zoster, Live 08/08/2012    TDAP status: Up to date  Flu Vaccine status: Up to  date  Pneumococcal vaccine status: Up to date  Covid-19 vaccine status: Completed vaccines  Qualifies for Shingles Vaccine? Yes   Zostavax completed Yes   Shingrix Completed?: Yes  Screening Tests Health Maintenance  Topic Date Due   COVID-19 Vaccine (7 - 2023-24 season) 10/21/2022   COLONOSCOPY (Pts 45-33yr Insurance coverage will need to be confirmed)  02/09/2023   MAMMOGRAM  03/20/2023   Medicare Annual Wellness (AWV)  10/03/2023   DTaP/Tdap/Td (3 - Td or Tdap) 07/29/2030   Pneumonia Vaccine 74 Years old  Completed   INFLUENZA VACCINE  Completed   DEXA SCAN  Completed   Hepatitis C Screening  Completed   Zoster Vaccines- Shingrix  Completed   HPV VACCINES  Aged Out    Health Maintenance  There are no preventive care reminders to display for this patient.   Colorectal cancer screening: Type of screening: Colonoscopy. Completed 02/09/2020. Repeat every 3 years  Mammogram status: Completed 03/19/2022. Repeat every year  Bone Density status: Completed 09/22/2021. Results reflect: Bone density results: OSTEOPENIA. Repeat every 2-3 years.  Lung Cancer Screening: (Low Dose CT Chest recommended if Age 74-80years, 30 pack-year currently smoking OR have quit w/in 15years.) does not qualify.   Lung Cancer Screening Referral: no  Additional Screening:  Hepatitis C Screening: does qualify; Completed 10/11/2015  Vision Screening: Recommended annual ophthalmology exams for early detection of glaucoma and other disorders of the eye. Is the patient up to date with their annual eye exam?  Yes  Who is the provider or what is the name of the office in which the patient attends annual eye exams? SMarica Otter OD. If pt is not established with a provider, would they like to be referred to a provider to establish care? No .   Dental Screening: Recommended annual dental exams for proper oral hygiene  Community Resource Referral / Chronic Care Management: CRR required this visit?  No    CCM required this visit?  No      Plan:     I have personally reviewed and noted the following in the patient's chart:   Medical and social history Use of alcohol, tobacco or illicit drugs  Current medications and supplements including opioid prescriptions. Patient is not currently taking opioid prescriptions. Functional ability and status Nutritional status Physical activity Advanced directives List of other physicians Hospitalizations, surgeries, and ER visits in previous 12 months Vitals Screenings to include cognitive, depression, and falls Referrals and appointments  In addition, I have reviewed and discussed with patient certain preventive protocols, quality metrics, and best practice recommendations. A written personalized care plan for preventive services as well as general preventive health recommendations were provided to patient.     SSheral Flow LPN   [id number]  Nurse Notes:  Normal cognitive status assessed by direct observation by this Nurse Health Advisor. No abnormalities found.

## 2022-10-12 DIAGNOSIS — Z79621 Long term (current) use of calcineurin inhibitor: Secondary | ICD-10-CM | POA: Diagnosis not present

## 2022-10-12 DIAGNOSIS — Z5181 Encounter for therapeutic drug level monitoring: Secondary | ICD-10-CM | POA: Diagnosis not present

## 2022-10-12 DIAGNOSIS — L438 Other lichen planus: Secondary | ICD-10-CM | POA: Diagnosis not present

## 2022-10-13 DIAGNOSIS — Z961 Presence of intraocular lens: Secondary | ICD-10-CM | POA: Diagnosis not present

## 2022-10-13 DIAGNOSIS — H30033 Focal chorioretinal inflammation, peripheral, bilateral: Secondary | ICD-10-CM | POA: Diagnosis not present

## 2022-10-13 DIAGNOSIS — Z79899 Other long term (current) drug therapy: Secondary | ICD-10-CM | POA: Diagnosis not present

## 2022-10-13 DIAGNOSIS — H35033 Hypertensive retinopathy, bilateral: Secondary | ICD-10-CM | POA: Diagnosis not present

## 2022-10-15 DIAGNOSIS — Z5181 Encounter for therapeutic drug level monitoring: Secondary | ICD-10-CM | POA: Diagnosis not present

## 2022-10-15 DIAGNOSIS — Z79899 Other long term (current) drug therapy: Secondary | ICD-10-CM | POA: Diagnosis not present

## 2022-10-19 ENCOUNTER — Telehealth: Payer: Self-pay | Admitting: Internal Medicine

## 2022-10-19 NOTE — Telephone Encounter (Signed)
Patient called and said that she linked her chart from [place] - she needs Dr. Alain Marion to look at her medications and Lab work.  Patient's number:  [phone]

## 2022-10-20 NOTE — Telephone Encounter (Signed)
Called pt no answer LMOM w/MD response../lmb 

## 2022-10-20 NOTE — Telephone Encounter (Signed)
Decreased GFR noted.  Please make an appointment to see me for follow-up.  Thank you

## 2022-11-12 ENCOUNTER — Ambulatory Visit: Payer: Medicare PPO | Admitting: Internal Medicine

## 2022-11-12 ENCOUNTER — Encounter: Payer: Self-pay | Admitting: Internal Medicine

## 2022-11-12 VITALS — BP 110/70 | HR 70 | Temp 98.0°F | Ht 65.0 in | Wt 160.0 lb

## 2022-11-12 DIAGNOSIS — E538 Deficiency of other specified B group vitamins: Secondary | ICD-10-CM

## 2022-11-12 DIAGNOSIS — L438 Other lichen planus: Secondary | ICD-10-CM | POA: Diagnosis not present

## 2022-11-12 DIAGNOSIS — R739 Hyperglycemia, unspecified: Secondary | ICD-10-CM

## 2022-11-12 DIAGNOSIS — I10 Essential (primary) hypertension: Secondary | ICD-10-CM | POA: Diagnosis not present

## 2022-11-12 DIAGNOSIS — N183 Chronic kidney disease, stage 3 unspecified: Secondary | ICD-10-CM

## 2022-11-12 DIAGNOSIS — Z0000 Encounter for general adult medical examination without abnormal findings: Secondary | ICD-10-CM

## 2022-11-12 NOTE — Progress Notes (Signed)
Subjective:  Patient ID: Joann Fields, female    DOB: 11/16/1950  Age: 72 y.o. MRN: [id number]  CC: Follow-up   HPI Joann Fields presents for lichen, elevated glucose, decreased GFR, elevated glucose  Outpatient Medications Prior to Visit  Medication Sig Dispense Refill   acitretin (SORIATANE) 10 MG capsule Take 1 capsule daily for 1 week (10/12/22-10/18/22) then starting on 10/19/22 take 2 capsule daily     azaTHIOprine (IMURAN) 50 MG tablet Take 100 mg by mouth daily.     cholecalciferol (VITAMIN D3) 25 MCG (1000 UNIT) tablet Take 1,000 Units by mouth daily.     Cyanocobalamin (VITAMIN B-12) 1000 MCG SUBL PLACE 1 TABLET UNDER THE TONGUE DAILY. 100 tablet 3   Difluprednate 0.05 % EMUL SMARTSIG:1 Drop(s) In Eye(s) Twice Daily PRN     diltiazem (CARDIZEM CD) 180 MG 24 hr capsule TAKE 1 CAPSULE BY MOUTH EVERY DAY 90 capsule 1   diltiazem (DILACOR XR) 240 MG 24 hr capsule Take 240 mg by mouth daily.     hydroxychloroquine (PLAQUENIL) 200 MG tablet Take 200 mg by mouth daily.     metroNIDAZOLE (METROGEL) 1 % gel Apply 1 Application topically daily.     tacrolimus (PROTOPIC) 0.03 % ointment Apply topically 2 (two) times daily.     pantoprazole (PROTONIX) 40 MG tablet TAKE 1 TABLET BY MOUTH TWICE A DAY (Patient not taking: Reported on 10/01/2021) 180 tablet 1   No facility-administered medications prior to visit.    ROS: Review of Systems  Constitutional:  Negative for activity change, appetite change, chills, fatigue and unexpected weight change.  HENT:  Negative for congestion, mouth sores and sinus pressure.   Eyes:  Negative for visual disturbance.  Respiratory:  Negative for cough and chest tightness.   Gastrointestinal:  Negative for abdominal pain and nausea.  Genitourinary:  Negative for difficulty urinating, frequency and vaginal pain.  Musculoskeletal:  Negative for back pain and gait problem.  Skin:  Negative for pallor and rash.  Neurological:  Negative for dizziness,  tremors, weakness, numbness and headaches.  Psychiatric/Behavioral:  Negative for confusion, sleep disturbance and suicidal ideas.     Objective:  BP 110/70 (BP Location: Left Arm, Patient Position: Sitting, Cuff Size: Normal)   Pulse 70   Temp 98 F (36.7 C) (Oral)   Ht 5\' 5"  ([id number] m)   Wt 160 lb (72.6 kg)   SpO2 97%   BMI 26.63 kg/m   BP Readings from Last 3 Encounters:  11/12/22 110/70  10/02/22 120/60  02/16/22 130/68    Wt Readings from Last 3 Encounters:  11/12/22 160 lb (72.6 kg)  10/02/22 158 lb 9.6 oz (71.9 kg)  02/16/22 160 lb (72.6 kg)    Physical Exam Constitutional:      General: She is not in acute distress.    Appearance: Normal appearance. She is well-developed. She is obese.  HENT:     Head: Normocephalic.     Right Ear: External ear normal.     Left Ear: External ear normal.     Nose: Nose normal.  Eyes:     General:        Right eye: No discharge.        Left eye: No discharge.     Conjunctiva/sclera: Conjunctivae normal.     Pupils: Pupils are equal, round, and reactive to light.  Neck:     Thyroid: No thyromegaly.     Vascular: No JVD.     Trachea: No tracheal  deviation.  Cardiovascular:     Rate and Rhythm: Normal rate and regular rhythm.     Heart sounds: Normal heart sounds.  Pulmonary:     Effort: No respiratory distress.     Breath sounds: No stridor. No wheezing.  Abdominal:     General: Bowel sounds are normal. There is no distension.     Palpations: Abdomen is soft. There is no mass.     Tenderness: There is no abdominal tenderness. There is no guarding or rebound.  Musculoskeletal:        General: No tenderness.     Cervical back: Normal range of motion and neck supple. No rigidity.  Lymphadenopathy:     Cervical: No cervical adenopathy.  Skin:    Findings: No erythema or rash.  Neurological:     Cranial Nerves: No cranial nerve deficit.     Motor: No abnormal muscle tone.     Coordination: Coordination normal.     Deep  Tendon Reflexes: Reflexes normal.  Psychiatric:        Behavior: Behavior normal.        Thought Content: Thought content normal.        Judgment: Judgment normal.     Lab Results  Component Value Date   WBC 11.3 (H) 10/17/2020   HGB 14.3 10/17/2020   HCT 42.4 10/17/2020   PLT 303.0 10/17/2020   GLUCOSE 121 (H) 10/17/2020   CHOL 218 (H) 02/23/2022   TRIG 87.0 02/23/2022   HDL 57.30 02/23/2022   LDLDIRECT 165.3 09/21/2013   LDLCALC 144 (H) 02/23/2022   ALT 19 10/17/2020   AST 13 10/17/2020   NA 139 10/17/2020   K 4.1 10/17/2020   CL 101 10/17/2020   CREATININE 1.42 (H) 10/17/2020   BUN 18 10/17/2020   CO2 32 10/17/2020   TSH 1.45 02/23/2022   HGBA1C 5.6 02/23/2022    DG BONE DENSITY (DXA)  Result Date: 09/22/2021 EXAM: DUAL X-RAY ABSORPTIOMETRY (DXA) FOR BONE MINERAL DENSITY IMPRESSION: Referring Physician:  Brien Few Your patient completed a bone mineral density test using GE Lunar iDXA system (analysis version: 16). Technologist: Arthur PATIENT: Name: Joann, Fields Patient ID: [id number] Birth Date: 12/26/1950 Height: 65.0 in. Sex: Female Measured: 09/22/2021 Weight: 157.2 lbs. Indications: Advanced Age, Estrogen Deficient, History of Osteopenia, Low Calcium Intake, osteoarthiritis, Postmenopausal Fractures: None Treatments: Vitamin D (E933.5) ASSESSMENT: The BMD measured at Femur Neck Right is 0.810 g/cm2 with a T-score of -1.6. This patient is considered osteopenic/low bone mass according to [facility]) criteria. The quality of the exam is good. Site Region Measured Date Measured Age YA BMD Significant CHANGE T-score DualFemur Neck Right 09/22/2021 72.6 -1.6 0.810 g/cm2 * DualFemur Neck Right 02/27/2019    70.1         -1.3    0.857 g/cm2 AP Spine L1-L4 09/22/2021 72.6 -1.2 1.033 g/cm2 * AP Spine  L1-L4      02/27/2019    70.1         -1.1    1.063 g/cm2 DualFemur Total Mean 09/22/2021 72.6 -1.2 0.854 g/cm2 * DualFemur Total Mean 02/27/2019    70.1          -1.0    0.883 g/cm2 World Health Organization [facility]) criteria for post-menopausal, Caucasian Women: Normal       T-score at or above -1 SD Osteopenia   T-score between -1 and -2.5 SD Osteoporosis T-score at or below -2.5 SD RECOMMENDATION: 1. All patients should optimize calcium and vitamin  D intake. 2. Consider FDA-approved medical therapies in postmenopausal women and men aged 20 years and older, based on the following: a. A hip or vertebral (clinical or morphometric) fracture. b. T-score = -2.5 at the femoral neck or spine after appropriate evaluation to exclude secondary causes. c. Low bone mass (T-score between -1.0 and -2.5 at the femoral neck or spine) and a 10-year probability of a hip fracture = 3% or a 10-year probability of a major osteoporosis-related fracture = 20% based on the [place]-adapted WHO algorithm. d. Clinician judgment and/or patient preferences may indicate treatment for people with 10-year fracture probabilities above or below these levels. FOLLOW-UP: Patients with diagnosis of osteoporosis or at high risk for fracture should have regular bone mineral density tests.? Patients eligible for Medicare are allowed routine testing every 2 years.? The testing frequency can be increased to one year for patients who have rapidly progressing disease, are receiving or discontinuing medical therapy to restore bone mass, or have additional risk factors. I have reviewed this study and agree with the findings. [facility] FRAX* 10-year Probability of Fracture Based on femoral neck BMD: DualFemur (Right) Major Osteoporotic Fracture: 5.0% Hip Fracture:                0.9% Population:                  [place] (Black) Risk Factors:                None *FRAX is a Materials engineer of the [employer] for Metabolic Bone Disease, a [place] (WHO) [facility]. ASSESSMENT: The probability of a major osteoporotic fracture is 5.0% within the next ten years.  The probability of a hip fracture is 0.9% within the next ten years. Electronically Signed   By: Rolm Baptise M.D.   On: 09/22/2021 10:00    Assessment & Plan:   Problem List Items Addressed This Visit       Cardiovascular and Mediastinum   Essential hypertension    Chronic Diltiazem        Musculoskeletal and Integument   Lichen planus pigmentosus - Primary    F/u w/[facility] On Acitretin, Imuran? Metrogel      Relevant Orders   TSH   Urinalysis   CBC with Differential/Platelet   Lipid panel   Comprehensive metabolic panel   Hemoglobin A1c   Microalbumin / creatinine urine ratio   VITAMIN D 25 Hydroxy (Vit-D Deficiency, Fractures)     Genitourinary   CRI (chronic renal insufficiency), stage 3 (moderate) (HCC)    Hydrate well Check microalbumin in urine, UA      Relevant Orders   Comprehensive metabolic panel   Hemoglobin A1c   Microalbumin / creatinine urine ratio   VITAMIN D 25 Hydroxy (Vit-D Deficiency, Fractures)     Other   Well adult exam   Relevant Orders   TSH   Urinalysis   CBC with Differential/Platelet   Lipid panel   Comprehensive metabolic panel   123456 deficiency    On B12      Relevant Orders   Vitamin B12   VITAMIN D 25 Hydroxy (Vit-D Deficiency, Fractures)   Other Visit Diagnoses     Hyperglycemia       Relevant Orders   Hemoglobin A1c   Microalbumin / creatinine urine ratio         No orders of the defined types were placed in this encounter.     Follow-up:  No follow-ups on file.  Walker Kehr, MD

## 2022-11-12 NOTE — Assessment & Plan Note (Signed)
Chronic Diltiazem

## 2022-11-12 NOTE — Assessment & Plan Note (Addendum)
Hydrate well Check microalbumin in urine, UA

## 2022-11-12 NOTE — Assessment & Plan Note (Addendum)
F/u w/[facility] On Acitretin, Imuran? Metrogel

## 2022-11-12 NOTE — Assessment & Plan Note (Signed)
On B12 

## 2022-11-15 ENCOUNTER — Encounter: Payer: Self-pay | Admitting: Internal Medicine

## 2023-01-05 DIAGNOSIS — H30033 Focal chorioretinal inflammation, peripheral, bilateral: Secondary | ICD-10-CM | POA: Diagnosis not present

## 2023-01-05 DIAGNOSIS — Z79899 Other long term (current) drug therapy: Secondary | ICD-10-CM | POA: Diagnosis not present

## 2023-01-05 DIAGNOSIS — Z961 Presence of intraocular lens: Secondary | ICD-10-CM | POA: Diagnosis not present

## 2023-01-05 DIAGNOSIS — H35033 Hypertensive retinopathy, bilateral: Secondary | ICD-10-CM | POA: Diagnosis not present

## 2023-01-11 DIAGNOSIS — Z79899 Other long term (current) drug therapy: Secondary | ICD-10-CM | POA: Diagnosis not present

## 2023-01-11 DIAGNOSIS — H30033 Focal chorioretinal inflammation, peripheral, bilateral: Secondary | ICD-10-CM | POA: Diagnosis not present

## 2023-01-25 DIAGNOSIS — L438 Other lichen planus: Secondary | ICD-10-CM | POA: Diagnosis not present

## 2023-02-11 ENCOUNTER — Encounter: Payer: Self-pay | Admitting: Gastroenterology

## 2023-02-12 DIAGNOSIS — H5789 Other specified disorders of eye and adnexa: Secondary | ICD-10-CM | POA: Diagnosis not present

## 2023-02-12 DIAGNOSIS — N182 Chronic kidney disease, stage 2 (mild): Secondary | ICD-10-CM | POA: Diagnosis not present

## 2023-02-12 DIAGNOSIS — L439 Lichen planus, unspecified: Secondary | ICD-10-CM | POA: Diagnosis not present

## 2023-02-12 DIAGNOSIS — M8580 Other specified disorders of bone density and structure, unspecified site: Secondary | ICD-10-CM | POA: Diagnosis not present

## 2023-02-12 DIAGNOSIS — Z8249 Family history of ischemic heart disease and other diseases of the circulatory system: Secondary | ICD-10-CM | POA: Diagnosis not present

## 2023-02-12 DIAGNOSIS — Z809 Family history of malignant neoplasm, unspecified: Secondary | ICD-10-CM | POA: Diagnosis not present

## 2023-02-12 DIAGNOSIS — E785 Hyperlipidemia, unspecified: Secondary | ICD-10-CM | POA: Diagnosis not present

## 2023-02-12 DIAGNOSIS — L719 Rosacea, unspecified: Secondary | ICD-10-CM | POA: Diagnosis not present

## 2023-02-12 DIAGNOSIS — I129 Hypertensive chronic kidney disease with stage 1 through stage 4 chronic kidney disease, or unspecified chronic kidney disease: Secondary | ICD-10-CM | POA: Diagnosis not present

## 2023-02-22 ENCOUNTER — Ambulatory Visit (INDEPENDENT_AMBULATORY_CARE_PROVIDER_SITE_OTHER): Payer: Medicare PPO | Admitting: Internal Medicine

## 2023-02-22 ENCOUNTER — Encounter: Payer: Self-pay | Admitting: Internal Medicine

## 2023-02-22 VITALS — BP 120/70 | HR 82 | Temp 97.9°F | Ht 65.0 in | Wt 151.0 lb

## 2023-02-22 DIAGNOSIS — E538 Deficiency of other specified B group vitamins: Secondary | ICD-10-CM | POA: Diagnosis not present

## 2023-02-22 DIAGNOSIS — I10 Essential (primary) hypertension: Secondary | ICD-10-CM | POA: Diagnosis not present

## 2023-02-22 DIAGNOSIS — Z0000 Encounter for general adult medical examination without abnormal findings: Secondary | ICD-10-CM | POA: Diagnosis not present

## 2023-02-22 DIAGNOSIS — L438 Other lichen planus: Secondary | ICD-10-CM | POA: Diagnosis not present

## 2023-02-22 NOTE — Assessment & Plan Note (Signed)
Chronic Cont on Diltiazem

## 2023-02-22 NOTE — Progress Notes (Signed)
Subjective:  Patient ID: Joann Fields, female    DOB: 1951/05/17  Age: 72 y.o. MRN: [id number]  CC: Annual Exam   HPI NYALA CUNDIFF presents for well exam  Outpatient Medications Prior to Visit  Medication Sig Dispense Refill   acitretin (SORIATANE) 10 MG capsule Take 1 capsule daily for 1 week (10/12/22-10/18/22) then starting on 10/19/22 take 2 capsule daily     azaTHIOprine (IMURAN) 50 MG tablet Take 100 mg by mouth daily.     cholecalciferol (VITAMIN D3) 25 MCG (1000 UNIT) tablet Take 1,000 Units by mouth daily.     Cyanocobalamin (VITAMIN B-12) 1000 MCG SUBL PLACE 1 TABLET UNDER THE TONGUE DAILY. 100 tablet 3   Difluprednate 0.05 % EMUL SMARTSIG:1 Drop(s) In Eye(s) Twice Daily PRN     diltiazem (CARDIZEM CD) 180 MG 24 hr capsule TAKE 1 CAPSULE BY MOUTH EVERY DAY 90 capsule 1   metroNIDAZOLE (METROGEL) 1 % gel Apply 1 Application topically daily.     tacrolimus (PROTOPIC) 0.03 % ointment Apply topically 2 (two) times daily.     diltiazem (DILACOR XR) 240 MG 24 hr capsule Take 240 mg by mouth daily.     hydroxychloroquine (PLAQUENIL) 200 MG tablet Take 200 mg by mouth daily.     pantoprazole (PROTONIX) 40 MG tablet TAKE 1 TABLET BY MOUTH TWICE A DAY (Patient not taking: Reported on 10/01/2021) 180 tablet 1   No facility-administered medications prior to visit.    ROS: Review of Systems  Constitutional:  Negative for activity change, appetite change, chills, fatigue and unexpected weight change.  HENT:  Negative for congestion, mouth sores and sinus pressure.   Eyes:  Negative for visual disturbance.  Respiratory:  Negative for cough and chest tightness.   Gastrointestinal:  Negative for abdominal pain and nausea.  Genitourinary:  Negative for difficulty urinating, frequency and vaginal pain.  Musculoskeletal:  Negative for back pain and gait problem.  Skin:  Negative for pallor and rash.  Neurological:  Negative for dizziness, tremors, weakness, numbness and headaches.   Psychiatric/Behavioral:  Negative for confusion and sleep disturbance.     Objective:  BP 120/70 (BP Location: Right Arm, Patient Position: Sitting, Cuff Size: Large)   Pulse 82   Temp 97.9 F (36.6 C) (Oral)   Ht 5\' 5"  (1.651 m)   Wt 151 lb (68.5 kg)   SpO2 96%   BMI 25.13 kg/m   BP Readings from Last 3 Encounters:  02/22/23 120/70  11/12/22 110/70  10/02/22 120/60    Wt Readings from Last 3 Encounters:  02/22/23 151 lb (68.5 kg)  11/12/22 160 lb (72.6 kg)  10/02/22 158 lb 9.6 oz (71.9 kg)    Physical Exam Constitutional:      General: She is not in acute distress.    Appearance: Normal appearance. She is well-developed.  HENT:     Head: Normocephalic.     Right Ear: External ear normal.     Left Ear: External ear normal.     Nose: Nose normal.  Eyes:     General:        Right eye: No discharge.        Left eye: No discharge.     Conjunctiva/sclera: Conjunctivae normal.     Pupils: Pupils are equal, round, and reactive to light.  Neck:     Thyroid: No thyromegaly.     Vascular: No JVD.     Trachea: No tracheal deviation.  Cardiovascular:     Rate and  Rhythm: Normal rate and regular rhythm.     Heart sounds: Normal heart sounds.  Pulmonary:     Effort: No respiratory distress.     Breath sounds: No stridor. No wheezing.  Abdominal:     General: Bowel sounds are normal. There is no distension.     Palpations: Abdomen is soft. There is no mass.     Tenderness: There is no abdominal tenderness. There is no guarding or rebound.  Musculoskeletal:        General: No tenderness.     Cervical back: Normal range of motion and neck supple. No rigidity.  Lymphadenopathy:     Cervical: No cervical adenopathy.  Skin:    Findings: No erythema or rash.  Neurological:     Cranial Nerves: No cranial nerve deficit.     Motor: No abnormal muscle tone.     Coordination: Coordination normal.     Deep Tendon Reflexes: Reflexes normal.  Psychiatric:        Behavior:  Behavior normal.        Thought Content: Thought content normal.        Judgment: Judgment normal.     Lab Results  Component Value Date   WBC 11.3 (H) 10/17/2020   HGB 14.3 10/17/2020   HCT 42.4 10/17/2020   PLT 303.0 10/17/2020   GLUCOSE 121 (H) 10/17/2020   CHOL 218 (H) 02/23/2022   TRIG 87.0 02/23/2022   HDL 57.30 02/23/2022   LDLDIRECT 165.3 09/21/2013   LDLCALC 144 (H) 02/23/2022   ALT 19 10/17/2020   AST 13 10/17/2020   NA 139 10/17/2020   K 4.1 10/17/2020   CL 101 10/17/2020   CREATININE 1.42 (H) 10/17/2020   BUN 18 10/17/2020   CO2 32 10/17/2020   TSH 1.45 02/23/2022   HGBA1C 5.6 02/23/2022    DG BONE DENSITY (DXA)  Result Date: 09/22/2021 EXAM: DUAL X-RAY ABSORPTIOMETRY (DXA) FOR BONE MINERAL DENSITY IMPRESSION: Referring Physician:  Olivia Mackie Your patient completed a bone mineral density test using GE Lunar iDXA system (analysis version: 16). Technologist: EH PATIENT: Name: Donnajo, Kindig Patient ID: [id number] Birth Date: [birth year] Height: 65.0 in. Sex: Female Measured: 09/22/2021 Weight: 157.2 lbs. Indications: Advanced Age, Estrogen Deficient, History of Osteopenia, Low Calcium Intake, osteoarthiritis, Postmenopausal Fractures: None Treatments: Vitamin D (E933.5) ASSESSMENT: The BMD measured at Femur Neck Right is 0.810 g/cm2 with a T-score of -1.6. This patient is considered osteopenic/low bone mass according to World Health Organization [facility]) criteria. The quality of the exam is good. Site Region Measured Date Measured Age YA BMD Significant CHANGE T-score DualFemur Neck Right 09/22/2021 72.6 -1.6 0.810 g/cm2 * DualFemur Neck Right 02/27/2019    70.1         -1.3    0.857 g/cm2 AP Spine L1-L4 09/22/2021 72.6 -1.2 1.033 g/cm2 * AP Spine  L1-L4      02/27/2019    70.1         -1.1    1.063 g/cm2 DualFemur Total Mean 09/22/2021 72.6 -1.2 0.854 g/cm2 * DualFemur Total Mean 02/27/2019    70.1         -1.0    0.883 g/cm2 World Health Organization [facility]) criteria for  post-menopausal, Caucasian Women: Normal       T-score at or above -1 SD Osteopenia   T-score between -1 and -2.5 SD Osteoporosis T-score at or below -2.5 SD RECOMMENDATION: 1. All patients should optimize calcium and vitamin D intake. 2. Consider FDA-approved medical therapies in postmenopausal  women and men aged 96 years and older, based on the following: a. A hip or vertebral (clinical or morphometric) fracture. b. T-score = -2.5 at the femoral neck or spine after appropriate evaluation to exclude secondary causes. c. Low bone mass (T-score between -1.0 and -2.5 at the femoral neck or spine) and a 10-year probability of a hip fracture = 3% or a 10-year probability of a major osteoporosis-related fracture = 20% based on the [place]-adapted WHO algorithm. d. Clinician judgment and/or patient preferences may indicate treatment for people with 10-year fracture probabilities above or below these levels. FOLLOW-UP: Patients with diagnosis of osteoporosis or at high risk for fracture should have regular bone mineral density tests.? Patients eligible for Medicare are allowed routine testing every 2 years.? The testing frequency can be increased to one year for patients who have rapidly progressing disease, are receiving or discontinuing medical therapy to restore bone mass, or have additional risk factors. I have reviewed this study and agree with the findings. [facility] FRAX* 10-year Probability of Fracture Based on femoral neck BMD: DualFemur (Right) Major Osteoporotic Fracture: 5.0% Hip Fracture:                0.9% Population:                  [place] (Black) Risk Factors:                None *FRAX is a Armed forces logistics/support/administrative officer of the [employer] of [employer] for Metabolic Bone Disease, a World Science writer (WHO) [employer]. ASSESSMENT: The probability of a major osteoporotic fracture is 5.0% within the next ten years. The probability of a hip fracture is 0.9% within the next ten  years. Electronically Signed   By: Charlett Nose M.D.   On: 09/22/2021 10:00    Assessment & Plan:   Problem List Items Addressed This Visit     Essential hypertension    Chronic Cont on Diltiazem      Well adult exam - Primary     We discussed age appropriate health related issues, including available/recomended screening tests and vaccinations. Labs were ordered to be later reviewed . All questions were answered. We discussed one or more of the following - seat belt use, use of sunscreen/sun exposure exercise, safe sex, fall risk reduction, second hand smoke exposure, firearm use and storage, seat belt use, a need for adhering to healthy diet and exercise. Labs were ordered . All questions were answered.  CT coronary ca scoring test ordered Shingrix discussed Colon - 2018, 2021, 2024 Dr Billy Coast: PAP, mammo, BDS due in 2025 Coronary calcium CT score of 0 - 2021 Ophth q 4 months      B12 deficiency    On B12      Lichen planus pigmentosus    On Acitretin, Imuran Appt w/[facility] - July 2024         No orders of the defined types were placed in this encounter.     Follow-up: Return in about 6 months (around 08/25/2023) for a follow-up visit.  Sonda Primes, MD

## 2023-02-22 NOTE — Assessment & Plan Note (Signed)
On B12 

## 2023-02-22 NOTE — Assessment & Plan Note (Signed)
On Acitretin, Imuran Appt w/[facility] - July 2024

## 2023-02-22 NOTE — Assessment & Plan Note (Addendum)
  We discussed age appropriate health related issues, including available/recomended screening tests and vaccinations. Labs were ordered to be later reviewed . All questions were answered. We discussed one or more of the following - seat belt use, use of sunscreen/sun exposure exercise, safe sex, fall risk reduction, second hand smoke exposure, firearm use and storage, seat belt use, a need for adhering to healthy diet and exercise. Labs were ordered . All questions were answered.  CT coronary ca scoring test ordered Shingrix discussed Colon - 2018, 2021, 2024 Dr Billy Coast: PAP, mammo, BDS due in 2025 Coronary calcium CT score of 0 - 2021 Ophth q 4 months

## 2023-03-05 ENCOUNTER — Encounter: Payer: Self-pay | Admitting: Gastroenterology

## 2023-03-05 ENCOUNTER — Other Ambulatory Visit: Payer: Self-pay | Admitting: Internal Medicine

## 2023-03-12 DIAGNOSIS — L438 Other lichen planus: Secondary | ICD-10-CM | POA: Diagnosis not present

## 2023-03-14 ENCOUNTER — Ambulatory Visit (HOSPITAL_COMMUNITY): Payer: Medicare PPO

## 2023-03-14 ENCOUNTER — Encounter (HOSPITAL_COMMUNITY): Payer: Self-pay

## 2023-03-14 ENCOUNTER — Ambulatory Visit (HOSPITAL_COMMUNITY)
Admission: EM | Admit: 2023-03-14 | Discharge: 2023-03-14 | Disposition: A | Discharge status: Home / Self Care | Payer: Medicare PPO | Attending: Internal Medicine | Admitting: Internal Medicine

## 2023-03-14 DIAGNOSIS — N23 Unspecified renal colic: Secondary | ICD-10-CM | POA: Diagnosis not present

## 2023-03-14 DIAGNOSIS — N3001 Acute cystitis with hematuria: Secondary | ICD-10-CM | POA: Insufficient documentation

## 2023-03-14 DIAGNOSIS — I10 Essential (primary) hypertension: Secondary | ICD-10-CM | POA: Diagnosis not present

## 2023-03-14 DIAGNOSIS — R1031 Right lower quadrant pain: Secondary | ICD-10-CM | POA: Insufficient documentation

## 2023-03-14 LAB — CBC
HCT: 44.9 % (ref 36.0–46.0)
Hemoglobin: 14.5 g/dL (ref 12.0–15.0)
MCH: 31 pg (ref 26.0–34.0)
MCHC: 32.3 g/dL (ref 30.0–36.0)
MCV: 96.1 fL (ref 80.0–100.0)
Platelets: 333 10*3/uL (ref 150–400)
RBC: 4.67 MIL/uL (ref 3.87–5.11)
RDW: 14.8 % (ref 11.5–15.5)
WBC: 11.5 10*3/uL — ABNORMAL HIGH (ref 4.0–10.5)
nRBC: 0 % (ref 0.0–0.2)

## 2023-03-14 LAB — POCT URINALYSIS DIP (MANUAL ENTRY)
Bilirubin, UA: NEGATIVE
Glucose, UA: NEGATIVE mg/dL
Ketones, POC UA: NEGATIVE mg/dL
Leukocytes, UA: NEGATIVE — AB
Nitrite, UA: NEGATIVE
Protein Ur, POC: NEGATIVE mg/dL
Spec Grav, UA: 1.025 (ref 1.010–1.025)
Urobilinogen, UA: 0.2 E.U./dL
pH, UA: 5.5 (ref 5.0–8.0)

## 2023-03-14 LAB — COMPREHENSIVE METABOLIC PANEL
ALT: 24 U/L (ref 0–44)
AST: 32 U/L (ref 15–41)
Albumin: 4 g/dL (ref 3.5–5.0)
Alkaline Phosphatase: 57 U/L (ref 38–126)
Anion gap: 8 (ref 5–15)
BUN: 17 mg/dL (ref 8–23)
CO2: 25 mmol/L (ref 22–32)
Calcium: 10.6 mg/dL — ABNORMAL HIGH (ref 8.9–10.3)
Chloride: 106 mmol/L (ref 98–111)
Creatinine, Ser: 1.23 mg/dL — ABNORMAL HIGH (ref 0.44–1.00)
GFR, Estimated: 46 mL/min — ABNORMAL LOW (ref >60)
Glucose, Bld: 108 mg/dL — ABNORMAL HIGH (ref 70–99)
Potassium: 4.3 mmol/L (ref 3.5–5.1)
Sodium: 139 mmol/L (ref 135–145)
Total Bilirubin: 0.5 mg/dL (ref 0.3–1.2)
Total Protein: 7.4 g/dL (ref 6.5–8.1)

## 2023-03-14 MED ORDER — KETOROLAC TROMETHAMINE 30 MG/ML IJ SOLN
INTRAMUSCULAR | Status: AC
  Filled 2023-03-14: qty 1

## 2023-03-14 MED ORDER — KETOROLAC TROMETHAMINE 30 MG/ML IJ SOLN
15.0000 mg | Freq: Once | INTRAMUSCULAR | Status: AC
  Administered 2023-03-14: 15 mg via INTRAMUSCULAR

## 2023-03-14 MED ORDER — ONDANSETRON 4 MG PO TBDP
4.0000 mg | ORAL_TABLET | Freq: Once | ORAL | Status: AC
  Administered 2023-03-14: 4 mg via ORAL

## 2023-03-14 MED ORDER — TAMSULOSIN HCL 0.4 MG PO CAPS: 0.4000 mg | ORAL_CAPSULE | Freq: Every day | ORAL | 0 refills | Status: DC

## 2023-03-14 MED ORDER — ONDANSETRON 4 MG PO TBDP: 4.0000 mg | ORAL_TABLET | Freq: Three times a day (TID) | ORAL | 0 refills | Status: DC | PRN

## 2023-03-14 MED ORDER — ONDANSETRON 4 MG PO TBDP
ORAL_TABLET | ORAL | Status: AC
  Filled 2023-03-14: qty 1

## 2023-03-14 MED ORDER — CEPHALEXIN 500 MG PO CAPS: 500.0000 mg | ORAL_CAPSULE | Freq: Two times a day (BID) | ORAL | 0 refills | Status: AC

## 2023-03-14 NOTE — Discharge Instructions (Addendum)
I am concerned that you may have a kidney stone versus a urinary tract infection.  I have given you a shot of pain medicine in the clinic to help with your pain associated with either infection or kidney stone as well as some nausea medicine.  You may use Zofran under the tongue every 8 hours as needed for nausea and vomiting at home.  Take tamsulosin 1 pill daily to help with abdominal pain should this be related to a kidney stone.  This will help your body pass the stone.  Take Keflex antibiotic twice daily for the next 7 days to treat suspected urinary tract infection. I will call you if the urine culture that I have sent shows any reason to change her antibiotic based on the type of bacteria that is grown in your bladder.  I will call you if any of the blood work that we drew in the clinic today is abnormal.  If you develop any new or worsening symptoms or if your symptoms do not start to improve, pleases return here or follow-up with your primary care provider. If your symptoms are severe, please go to the emergency room.

## 2023-03-14 NOTE — ED Triage Notes (Addendum)
Complaint:RIGHT SIDE FLANK PAIN onset 7am this morning. No falls or injuries. No urinary symptoms, constipation, diarrhea. Patient having nausea and emesis. Took some tylenol but it came back up.   Patient recently stopped taking acitretin. No dietary changes or new foods.
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
## 2023-03-15 ENCOUNTER — Telehealth: Payer: Self-pay | Admitting: Internal Medicine

## 2023-03-15 LAB — URINE CULTURE: Culture: <10000 — AB

## 2023-03-15 NOTE — Telephone Encounter (Signed)
Pt wanted  a called back from nurse pt would not tell me what it was about Please review.

## 2023-03-16 NOTE — Telephone Encounter (Signed)
Pt has stated she was seen in the UC and they have stated she has a kidney stone. Pt is wanting to know doe she need to take the medication that was rx'd to her (Cephalexin and Flomax)? Pt is also wanting to know since they did blood work does she still needs to do the blood work Plotnikov has ordered and she wants to know if she needs to be seen by provider?

## 2023-03-18 NOTE — Telephone Encounter (Signed)
Pt has scheduled an apptmnt to see PCP on Aug 5th.  Pt is wanting to know if she needs to go get her labs done before the visit?

## 2023-03-18 NOTE — Telephone Encounter (Signed)
Please instruct the patient: stop cephalexin when the  prescription is finished. Stop Flomax when/if you passed the stone. Follow-up with me or one of my partners this or next week. Thank you

## 2023-03-24 ENCOUNTER — Other Ambulatory Visit (INDEPENDENT_AMBULATORY_CARE_PROVIDER_SITE_OTHER): Payer: Medicare PPO

## 2023-03-24 DIAGNOSIS — N183 Chronic kidney disease, stage 3 unspecified: Secondary | ICD-10-CM | POA: Diagnosis not present

## 2023-03-24 DIAGNOSIS — L438 Other lichen planus: Secondary | ICD-10-CM

## 2023-03-24 DIAGNOSIS — E538 Deficiency of other specified B group vitamins: Secondary | ICD-10-CM | POA: Diagnosis not present

## 2023-03-24 DIAGNOSIS — R739 Hyperglycemia, unspecified: Secondary | ICD-10-CM | POA: Diagnosis not present

## 2023-03-24 DIAGNOSIS — Z0000 Encounter for general adult medical examination without abnormal findings: Secondary | ICD-10-CM

## 2023-03-24 LAB — CBC WITH DIFFERENTIAL/PLATELET
Basophils Absolute: 0 10*3/uL (ref 0.0–0.1)
Basophils Relative: 0.8 % (ref 0.0–3.0)
Eosinophils Absolute: 0.1 10*3/uL (ref 0.0–0.7)
Eosinophils Relative: 1.7 % (ref 0.0–5.0)
HCT: 40.5 % (ref 36.0–46.0)
Hemoglobin: 13.3 g/dL (ref 12.0–15.0)
Lymphocytes Relative: 24.9 % (ref 12.0–46.0)
Lymphs Abs: 1.4 10*3/uL (ref 0.7–4.0)
MCHC: 32.8 g/dL (ref 30.0–36.0)
MCV: 96.2 fl (ref 78.0–100.0)
Monocytes Absolute: 0.4 10*3/uL (ref 0.1–1.0)
Monocytes Relative: 7.5 % (ref 3.0–12.0)
Neutro Abs: 3.6 10*3/uL (ref 1.4–7.7)
Neutrophils Relative %: 65.1 % (ref 43.0–77.0)
Platelets: 319 10*3/uL (ref 150.0–400.0)
RBC: 4.21 Mil/uL (ref 3.87–5.11)
RDW: 15.4 % (ref 11.5–15.5)
WBC: 5.6 10*3/uL (ref 4.0–10.5)

## 2023-03-24 LAB — LIPID PANEL
Cholesterol: 199 mg/dL (ref 0–200)
HDL: 60.6 mg/dL (ref >39.00)
LDL Cholesterol: 125 mg/dL — ABNORMAL HIGH (ref 0–99)
NonHDL: 138.17
Total CHOL/HDL Ratio: 3
Triglycerides: 66 mg/dL (ref 0.0–149.0)
VLDL: 13.2 mg/dL (ref 0.0–40.0)

## 2023-03-24 LAB — HEMOGLOBIN A1C: Hgb A1c MFr Bld: 5.8 % (ref 4.6–6.5)

## 2023-03-24 LAB — URINALYSIS, ROUTINE W REFLEX MICROSCOPIC
Bilirubin Urine: NEGATIVE
Ketones, ur: NEGATIVE
Nitrite: NEGATIVE
Specific Gravity, Urine: 1.025 (ref 1.000–1.030)
Total Protein, Urine: NEGATIVE
Urine Glucose: NEGATIVE
Urobilinogen, UA: 0.2 (ref 0.0–1.0)
pH: 6 (ref 5.0–8.0)

## 2023-03-24 LAB — VITAMIN D 25 HYDROXY (VIT D DEFICIENCY, FRACTURES): VITD: 57.32 ng/mL (ref 30.00–100.00)

## 2023-03-24 LAB — COMPREHENSIVE METABOLIC PANEL
ALT: 17 U/L (ref 0–35)
AST: 21 U/L (ref 0–37)
Albumin: 4.1 g/dL (ref 3.5–5.2)
Alkaline Phosphatase: 55 U/L (ref 39–117)
BUN: 15 mg/dL (ref 6–23)
CO2: 28 mEq/L (ref 19–32)
Calcium: 10.4 mg/dL (ref 8.4–10.5)
Chloride: 109 mEq/L (ref 96–112)
Creatinine, Ser: 1.04 mg/dL (ref 0.40–1.20)
GFR: 53.07 mL/min — ABNORMAL LOW (ref >60.00)
Glucose, Bld: 92 mg/dL (ref 70–99)
Potassium: 4.1 mEq/L (ref 3.5–5.1)
Sodium: 144 mEq/L (ref 135–145)
Total Bilirubin: 0.6 mg/dL (ref 0.2–1.2)
Total Protein: 6.5 g/dL (ref 6.0–8.3)

## 2023-03-24 LAB — VITAMIN B12: Vitamin B-12: 770 pg/mL (ref 211–911)

## 2023-03-24 LAB — MICROALBUMIN / CREATININE URINE RATIO
Creatinine,U: 128.2 mg/dL
Microalb Creat Ratio: 0.5 mg/g (ref 0.0–30.0)
Microalb, Ur: <0.7 mg/dL (ref 0.0–1.9)

## 2023-03-24 LAB — TSH: TSH: 1.61 u[IU]/mL (ref 0.35–5.50)

## 2023-03-29 ENCOUNTER — Encounter: Payer: Self-pay | Admitting: Internal Medicine

## 2023-03-29 ENCOUNTER — Ambulatory Visit: Payer: Medicare PPO | Admitting: Internal Medicine

## 2023-03-29 VITALS — BP 120/80 | HR 70 | Temp 98.6°F | Ht 65.0 in | Wt 152.0 lb

## 2023-03-29 DIAGNOSIS — R1031 Right lower quadrant pain: Secondary | ICD-10-CM

## 2023-03-29 DIAGNOSIS — I10 Essential (primary) hypertension: Secondary | ICD-10-CM

## 2023-03-29 DIAGNOSIS — N760 Acute vaginitis: Secondary | ICD-10-CM

## 2023-03-29 DIAGNOSIS — E538 Deficiency of other specified B group vitamins: Secondary | ICD-10-CM | POA: Diagnosis not present

## 2023-03-29 MED ORDER — FLUCONAZOLE 150 MG PO TABS: 150.0000 mg | ORAL_TABLET | Freq: Once | ORAL | 1 refills | Status: AC

## 2023-03-29 NOTE — Progress Notes (Signed)
Subjective:  Patient ID: Joann Fields, female    DOB: 1950-11-08  Age: 72 y.o. MRN: [id number]  CC: Follow-up (1 week f/u)   HPI LUDA VANDEPUTTE presents for probable renal colic on 03/14/23 Ache on the R side Complains of vaginitis symptoms ER notes/test reviewed  Outpatient Medications Prior to Visit  Medication Sig Dispense Refill   acitretin (SORIATANE) 10 MG capsule Take 1 capsule daily for 1 week (10/12/22-10/18/22) then starting on 10/19/22 take 2 capsule daily (Patient not taking: Reported on 04/05/2023)     azaTHIOprine (IMURAN) 50 MG tablet Take 100 mg by mouth daily.     cholecalciferol (VITAMIN D3) 25 MCG (1000 UNIT) tablet Take 1,000 Units by mouth daily.     Cyanocobalamin (VITAMIN B-12) 1000 MCG SUBL PLACE 1 TABLET UNDER THE TONGUE DAILY. 100 tablet 3   Difluprednate 0.05 % EMUL SMARTSIG:1 Drop(s) In Eye(s) Twice Daily PRN (Patient not taking: Reported on 04/05/2023)     diltiazem (CARDIZEM CD) 180 MG 24 hr capsule TAKE 1 CAPSULE BY MOUTH EVERY DAY 90 capsule 3   metroNIDAZOLE (METROGEL) 1 % gel Apply 1 Application topically daily.     ondansetron (ZOFRAN-ODT) 4 MG disintegrating tablet Take 1 tablet (4 mg total) by mouth every 8 (eight) hours as needed for nausea or vomiting. (Patient not taking: Reported on 04/05/2023) 20 tablet 0   pantoprazole (PROTONIX) 40 MG tablet TAKE 1 TABLET BY MOUTH TWICE A DAY (Patient not taking: Reported on 04/05/2023) 180 tablet 1   tacrolimus (PROTOPIC) 0.03 % ointment Apply topically 2 (two) times daily.     tamsulosin (FLOMAX) 0.4 MG CAPS capsule Take 1 capsule (0.4 mg total) by mouth daily. 7 capsule 0   No facility-administered medications prior to visit.    ROS: Review of Systems  Constitutional:  Negative for activity change, appetite change, chills, fatigue and unexpected weight change.  HENT:  Negative for congestion, mouth sores and sinus pressure.   Eyes:  Negative for visual disturbance.  Respiratory:  Negative for cough  and chest tightness.   Gastrointestinal:  Negative for abdominal pain, diarrhea and nausea.  Genitourinary:  Positive for vaginal discharge. Negative for difficulty urinating, frequency and vaginal pain.  Musculoskeletal:  Negative for back pain and gait problem.  Skin:  Negative for pallor and rash.  Neurological:  Negative for dizziness, tremors, weakness, numbness and headaches.  Psychiatric/Behavioral:  Negative for confusion and sleep disturbance.     Objective:  BP 120/80 (BP Location: Left Arm, Patient Position: Sitting, Cuff Size: Normal)   Pulse 70   Temp 98.6 F (37 C) (Oral)   Ht 5\' 5"  (1.651 m)   Wt 152 lb (68.9 kg)   SpO2 94%   BMI 25.29 kg/m   BP Readings from Last 3 Encounters:  03/29/23 120/80  03/14/23 (!) 158/74  02/22/23 120/70    Wt Readings from Last 3 Encounters:  04/05/23 152 lb (68.9 kg)  03/29/23 152 lb (68.9 kg)  03/14/23 151 lb (68.5 kg)    Physical Exam  Lab Results  Component Value Date   WBC 5.6 03/24/2023   HGB 13.3 03/24/2023   HCT 40.5 03/24/2023   PLT 319.0 03/24/2023   GLUCOSE 92 03/24/2023   CHOL 199 03/24/2023   TRIG 66.0 03/24/2023   HDL 60.60 03/24/2023   LDLDIRECT 165.3 09/21/2013   LDLCALC 125 (H) 03/24/2023   ALT 17 03/24/2023   AST 21 03/24/2023   NA 144 03/24/2023   K 4.1 03/24/2023   CL  109 03/24/2023   CREATININE 1.04 03/24/2023   BUN 15 03/24/2023   CO2 28 03/24/2023   TSH 1.61 03/24/2023   HGBA1C 5.8 03/24/2023   MICROALBUR <0.7 03/24/2023    No results found.  Assessment & Plan:   Problem List Items Addressed This Visit     Essential hypertension    Continue on diltiazem      B12 deficiency    On vitamin B12      RLQ abdominal pain - Primary    Obtain transvaginal pelvic ultrasound      Relevant Orders   US PELVIC COMPLETE WITH TRANSVAGINAL   Vaginitis    Related to recent antibiotics.  Diflucan prescribed         Meds ordered this encounter  Medications   fluconazole (DIFLUCAN)  150 MG tablet    Sig: Take 1 tablet (150 mg total) by mouth once for 1 dose.    Dispense:  3 tablet    Refill:  1      Follow-up: No follow-ups on file.  Sonda Primes, MD

## 2023-04-05 ENCOUNTER — Ambulatory Visit (AMBULATORY_SURGERY_CENTER): Payer: Medicare PPO | Admitting: *Deleted

## 2023-04-05 ENCOUNTER — Encounter: Payer: Self-pay | Admitting: Gastroenterology

## 2023-04-05 VITALS — Ht 65.0 in | Wt 152.0 lb

## 2023-04-05 DIAGNOSIS — Z86010 Personal history of colonic polyps: Secondary | ICD-10-CM

## 2023-04-05 NOTE — Progress Notes (Signed)
Pt's name and DOB verified at the beginning of the pre-visit.  Pt denies any difficulty with ambulating,sitting, laying down or rolling side to side Gave both [facility] main # and MD on call # prior to instructions.  No egg or soy allergy known to patient  No issues known to pt with past sedation with any surgeries or procedures Pt denies having issues being intubated Pt has no issues moving head neck or swallowing No FH of Malignant Hyperthermia Pt is not on diet pills Pt is not on home 02  Pt is not on blood thinners  Pt denies issues with constipation  Pt is not on dialysis Pt denise any abnormal heart rhythms  Pt denies any upcoming cardiac testing Pt encouraged to use to use Singlecare or Goodrx to reduce cost  Patient's chart reviewed by Joann Fields CNRA prior to pre-visit and patient appropriate for the [facility].  Pre-visit completed and red dot placed by patient's name on their procedure day (on provider's schedule).  . Visit by phone Pt states weight is 152lb Instructed pt why it is important to and  to call if they have any changes in health or new medications. Directed them to the # given and on instructions.   Pt states they will.  Instructions reviewed with pt and pt states understanding. Instructed to review again prior to procedure. Pt states they will.  Instructions sent by mail with coupon and by my chart

## 2023-04-09 ENCOUNTER — Ambulatory Visit: Payer: Medicare PPO | Admitting: Podiatry

## 2023-04-11 ENCOUNTER — Encounter: Payer: Self-pay | Admitting: Internal Medicine

## 2023-04-11 DIAGNOSIS — N760 Acute vaginitis: Secondary | ICD-10-CM | POA: Insufficient documentation

## 2023-04-11 DIAGNOSIS — R1031 Right lower quadrant pain: Secondary | ICD-10-CM | POA: Insufficient documentation

## 2023-04-11 NOTE — Assessment & Plan Note (Signed)
On vitamin B12

## 2023-04-11 NOTE — Assessment & Plan Note (Signed)
Obtain transvaginal pelvic ultrasound

## 2023-04-11 NOTE — Assessment & Plan Note (Signed)
Related to recent antibiotics.  Diflucan prescribed

## 2023-04-11 NOTE — Assessment & Plan Note (Signed)
Continue on diltiazem.

## 2023-04-13 ENCOUNTER — Ambulatory Visit: Payer: Medicare PPO | Admitting: Podiatry

## 2023-04-13 DIAGNOSIS — L600 Ingrowing nail: Secondary | ICD-10-CM | POA: Diagnosis not present

## 2023-04-13 DIAGNOSIS — B351 Tinea unguium: Secondary | ICD-10-CM | POA: Diagnosis not present

## 2023-04-13 NOTE — Patient Instructions (Signed)
I have ordered a medication for you that will come from [facility] in [place]. They should be calling you to verify insurance and will mail the medication to you. If you live close by then you can go by their pharmacy to pick up the medication. Their phone number is [phone]. If you do not hear from them in the next few days, please give us a call at [phone].   

## 2023-04-13 NOTE — Progress Notes (Signed)
Subjective: Chief Complaint  Patient presents with   Nail Problem   72 year old female presents the office today with concerns of toenail issues.  She states her nails are thickened discolored and she is having some peeling around as well.  She states they get ingrown.  Times her nails become painful.  Denies any swelling or redness or drainage.  She was previously seen for concerns of nail fungus previously.  Objective: AAO x3, NAD DP/PT pulses palpable bilaterally, CRT less than 3 seconds Nails are hypertrophic, dystrophic with yellow, brown discoloration.  No edema, erythema, drainage or pus or signs of infection.  No significant hyperpigmentation there is no extension of any hyperpigmentation of the surrounding skin.  Mild incurvation of nail borders without any edema, erythema, drainage or pus or signs of infection. No pain with calf compression, swelling, warmth, erythema  Assessment: Onychomycosis  Plan: -All treatment options discussed with the patient including all alternatives, risks, complications.  -Discussed.  Treatment options including oral, topical treatments we also discussed nail removal.  I did sharply debride the nails today without any complications or bleeding.  Discussed different options I reviewed the previous cultures with her.  I ordered a compound cream through [facility] to help with the thickening/dystrophy of the nail as well as the fungus. -Patient encouraged to call the office with any questions, concerns, change in symptoms.   Vivi Barrack DPM

## 2023-04-14 ENCOUNTER — Ambulatory Visit
Admission: RE | Admit: 2023-04-14 | Discharge: 2023-04-14 | Disposition: A | Discharge status: Home / Self Care | Payer: Medicare PPO | Source: Ambulatory Visit | Attending: Internal Medicine | Admitting: Internal Medicine

## 2023-04-14 DIAGNOSIS — R1031 Right lower quadrant pain: Secondary | ICD-10-CM

## 2023-04-28 ENCOUNTER — Telehealth: Payer: Self-pay | Admitting: Podiatry

## 2023-04-28 NOTE — Telephone Encounter (Signed)
Pt called and was to have a medication sent in to [facility] and she has not heard anything. Please advise

## 2023-04-30 ENCOUNTER — Ambulatory Visit (AMBULATORY_SURGERY_CENTER): Payer: Medicare PPO | Admitting: Gastroenterology

## 2023-04-30 ENCOUNTER — Encounter: Payer: Self-pay | Admitting: Gastroenterology

## 2023-04-30 VITALS — BP 139/76 | HR 60 | Temp 96.4°F | Resp 16 | Ht 65.0 in | Wt 152.0 lb

## 2023-04-30 DIAGNOSIS — D123 Benign neoplasm of transverse colon: Secondary | ICD-10-CM | POA: Diagnosis not present

## 2023-04-30 DIAGNOSIS — I10 Essential (primary) hypertension: Secondary | ICD-10-CM | POA: Diagnosis not present

## 2023-04-30 DIAGNOSIS — Z86010 Personal history of colonic polyps: Secondary | ICD-10-CM

## 2023-04-30 DIAGNOSIS — D120 Benign neoplasm of cecum: Secondary | ICD-10-CM | POA: Diagnosis not present

## 2023-04-30 DIAGNOSIS — K219 Gastro-esophageal reflux disease without esophagitis: Secondary | ICD-10-CM | POA: Diagnosis not present

## 2023-04-30 DIAGNOSIS — Z09 Encounter for follow-up examination after completed treatment for conditions other than malignant neoplasm: Secondary | ICD-10-CM

## 2023-04-30 DIAGNOSIS — D122 Benign neoplasm of ascending colon: Secondary | ICD-10-CM

## 2023-04-30 HISTORY — PX: COLONOSCOPY W/ POLYPECTOMY: SHX1380

## 2023-04-30 MED ORDER — SODIUM CHLORIDE 0.9 % IV SOLN: 500.0000 mL | Freq: Once | INTRAVENOUS | Status: DC

## 2023-04-30 NOTE — Progress Notes (Unsigned)
Report to PACU, RN, vss, BBS= Clear.  

## 2023-04-30 NOTE — Progress Notes (Unsigned)
Called to room to assist during endoscopic procedure.  Patient ID and intended procedure confirmed with present staff. Received instructions for my participation in the procedure from the performing physician.  

## 2023-04-30 NOTE — Patient Instructions (Addendum)
    Handouts on polyps,diverticulosis,& hemorrhoids given to you today.   Await pathology results on polyps removed - in MY Chart  Follow high fiber diet- handout given to you today  Use FiberCon 1-2 tablets daily ( over the counter)  YOU HAD AN ENDOSCOPIC PROCEDURE TODAY AT THE [facility]:   Refer to the procedure report that was given to you for any specific questions about what was found during the examination.  If the procedure report does not answer your questions, please call your gastroenterologist to clarify.  If you requested that your care partner not be given the details of your procedure findings, then the procedure report has been included in a sealed envelope for you to review at your convenience later.  YOU SHOULD EXPECT: Some feelings of bloating in the abdomen. Passage of more gas than usual.  Walking can help get rid of the air that was put into your GI tract during the procedure and reduce the bloating. If you had a lower endoscopy (such as a colonoscopy or flexible sigmoidoscopy) you may notice spotting of blood in your stool or on the toilet paper. If you underwent a bowel prep for your procedure, you may not have a normal bowel movement for a few days.  Please Note:  You might notice some irritation and congestion in your nose or some drainage.  This is from the oxygen used during your procedure.  There is no need for concern and it should clear up in a day or so.  SYMPTOMS TO REPORT IMMEDIATELY:  Following lower endoscopy (colonoscopy or flexible sigmoidoscopy):  Excessive amounts of blood in the stool  Significant tenderness or worsening of abdominal pains  Swelling of the abdomen that is new, acute  Fever of 100F or higher   For urgent or emergent issues, a gastroenterologist can be reached at any hour by calling [phone]. Do not use MyChart messaging for urgent concerns.    DIET:  We do recommend a small meal at first, but then you may  proceed to your regular diet.  Drink plenty of fluids but you should avoid alcoholic beverages for 24 hours.  ACTIVITY:  You should plan to take it easy for the rest of today and you should NOT DRIVE or use heavy machinery until tomorrow (because of the sedation medicines used during the test).    FOLLOW UP: Our staff will call the number listed on your records the next business day following your procedure.  We will call around 7:15- 8:00 am to check on you and address any questions or concerns that you may have regarding the information given to you following your procedure. If we do not reach you, we will leave a message.     If any biopsies were taken you will be contacted by phone or by letter within the next 1-3 weeks.  Please call us at [phone] if you have not heard about the biopsies in 3 weeks.    SIGNATURES/CONFIDENTIALITY: You and/or your care partner have signed paperwork which will be entered into your electronic medical record.  These signatures attest to the fact that that the information above on your After Visit Summary has been reviewed and is understood.  Full responsibility of the confidentiality of this discharge information lies with you and/or your care-partner.

## 2023-04-30 NOTE — Progress Notes (Unsigned)
GASTROENTEROLOGY PROCEDURE H&P NOTE   Primary Care Physician: Tresa Garter, MD  HPI: Joann Fields is a 72 y.o. female who presents for Colonoscopy for surveillance in the setting of previous adenomas.  Past Medical History:  Diagnosis Date   Arthritis    Eyes   Cataract    removed 2021   Chronic kidney disease    Kidney Stone   GERD (gastroesophageal reflux disease)    Hyperlipidemia    Hypertension    Osteopenia    Past Surgical History:  Procedure Laterality Date   CATARACT EXTRACTION, BILATERAL Bilateral 2021   feb- right march-left   COLONOSCOPY  2018   DG THUMB LEFT HAND     cut her hand while prepping chicken for supper   POLYPECTOMY     SVD x 2     WISDOM TOOTH EXTRACTION     Current Outpatient Medications  Medication Sig Dispense Refill   acitretin (SORIATANE) 10 MG capsule Take 1 capsule daily for 1 week (10/12/22-10/18/22) then starting on 10/19/22 take 2 capsule daily (Patient not taking: Reported on 04/05/2023)     azaTHIOprine (IMURAN) 50 MG tablet Take 100 mg by mouth daily.     cholecalciferol (VITAMIN D3) 25 MCG (1000 UNIT) tablet Take 1,000 Units by mouth daily.     Cyanocobalamin (VITAMIN B-12) 1000 MCG SUBL PLACE 1 TABLET UNDER THE TONGUE DAILY. 100 tablet 3   Difluprednate 0.05 % EMUL SMARTSIG:1 Drop(s) In Eye(s) Twice Daily PRN (Patient not taking: Reported on 04/05/2023)     diltiazem (CARDIZEM CD) 180 MG 24 hr capsule TAKE 1 CAPSULE BY MOUTH EVERY DAY 90 capsule 3   metroNIDAZOLE (METROGEL) 1 % gel Apply 1 Application topically daily.     ondansetron (ZOFRAN-ODT) 4 MG disintegrating tablet Take 1 tablet (4 mg total) by mouth every 8 (eight) hours as needed for nausea or vomiting. (Patient not taking: Reported on 04/05/2023) 20 tablet 0   pantoprazole (PROTONIX) 40 MG tablet TAKE 1 TABLET BY MOUTH TWICE A DAY (Patient not taking: Reported on 04/05/2023) 180 tablet 1   tacrolimus (PROTOPIC) 0.03 % ointment Apply topically 2 (two) times daily.      tamsulosin (FLOMAX) 0.4 MG CAPS capsule Take 1 capsule (0.4 mg total) by mouth daily. 7 capsule 0   No current facility-administered medications for this visit.    Current Outpatient Medications:    acitretin (SORIATANE) 10 MG capsule, Take 1 capsule daily for 1 week (10/12/22-10/18/22) then starting on 10/19/22 take 2 capsule daily (Patient not taking: Reported on 04/05/2023), Disp: , Rfl:    azaTHIOprine (IMURAN) 50 MG tablet, Take 100 mg by mouth daily., Disp: , Rfl:    cholecalciferol (VITAMIN D3) 25 MCG (1000 UNIT) tablet, Take 1,000 Units by mouth daily., Disp: , Rfl:    Cyanocobalamin (VITAMIN B-12) 1000 MCG SUBL, PLACE 1 TABLET UNDER THE TONGUE DAILY., Disp: 100 tablet, Rfl: 3   Difluprednate 0.05 % EMUL, SMARTSIG:1 Drop(s) In Eye(s) Twice Daily PRN (Patient not taking: Reported on 04/05/2023), Disp: , Rfl:    diltiazem (CARDIZEM CD) 180 MG 24 hr capsule, TAKE 1 CAPSULE BY MOUTH EVERY DAY, Disp: 90 capsule, Rfl: 3   metroNIDAZOLE (METROGEL) 1 % gel, Apply 1 Application topically daily., Disp: , Rfl:    ondansetron (ZOFRAN-ODT) 4 MG disintegrating tablet, Take 1 tablet (4 mg total) by mouth every 8 (eight) hours as needed for nausea or vomiting. (Patient not taking: Reported on 04/05/2023), Disp: 20 tablet, Rfl: 0   pantoprazole (PROTONIX)  40 MG tablet, TAKE 1 TABLET BY MOUTH TWICE A DAY (Patient not taking: Reported on 04/05/2023), Disp: 180 tablet, Rfl: 1   tacrolimus (PROTOPIC) 0.03 % ointment, Apply topically 2 (two) times daily., Disp: , Rfl:    tamsulosin (FLOMAX) 0.4 MG CAPS capsule, Take 1 capsule (0.4 mg total) by mouth daily., Disp: 7 capsule, Rfl: 0 Allergies  Allergen Reactions   Amoxicillin Rash   Family History  Problem Relation Age of Onset   Lung cancer Mother 38   Heart disease Father 34       MI   Alzheimer's disease Brother 33   Early death Maternal Aunt    Hearing loss Maternal Aunt    Lymphoma Sister    Colon cancer Neg Hx    Colon polyps Neg Hx     Esophageal cancer Neg Hx    Rectal cancer Neg Hx    Stomach cancer Neg Hx    Social History   Socioeconomic History   Marital status: Single    Spouse name: Not on file   Number of children: 2   Years of education: Not on file   Highest education level: Not on file  Occupational History   Occupation: Retired  Tobacco Use   Smoking status: Never   Smokeless tobacco: Never   Tobacco comments:    only in [employer] Use   Vaping status: Never Used  Substance and Sexual Activity   Alcohol use: Yes    Alcohol/week: 0.0 standard drinks of alcohol    Comment: Rarely; once yearly   Drug use: No   Sexual activity: Not Currently  Other Topics Concern   Not on file  Social History Narrative   Not on file   Social Determinants of Health   Financial Resource Strain: Low Risk  (10/02/2022)   Overall Financial Resource Strain (CARDIA)    Difficulty of Paying Living Expenses: Not hard at all  Food Insecurity: No Food Insecurity (10/02/2022)   Hunger Vital Sign    Worried About Running Out of Food in the Last Year: Never true    Ran Out of Food in the Last Year: Never true  Transportation Needs: No Transportation Needs (10/02/2022)   PRAPARE - Administrator, Civil Service (Medical): No    Lack of Transportation (Non-Medical): No  Physical Activity: Sufficiently Active (10/02/2022)   Exercise Vital Sign    Days of Exercise per Week: 5 days    Minutes of Exercise per Session: 30 min  Stress: No Stress Concern Present (10/02/2022)   [employer] of Occupational Health - Occupational Stress Questionnaire    Feeling of Stress : Not at all  Social Connections: Moderately Integrated (10/02/2022)   Social Connection and Isolation Panel [NHANES]    Frequency of Communication with Friends and Family: More than three times a week    Frequency of Social Gatherings with Friends and Family: More than three times a week    Attends Religious Services: More than 4 times per year     Active Member of [employer] or Organizations: Yes    Attends Banker Meetings: More than 4 times per year    Marital Status: Never married  Intimate Partner Violence: Not At Risk (10/02/2022)   Humiliation, Afraid, Rape, and Kick questionnaire    Fear of Current or Ex-Partner: No    Emotionally Abused: No    Physically Abused: No    Sexually Abused: No    Physical Exam: There were no vitals  filed for this visit. There is no height or weight on file to calculate BMI. GEN: NAD EYE: Sclerae anicteric ENT: MMM CV: Non-tachycardic GI: Soft, NT/ND NEURO:  Alert & Oriented x 3  Lab Results: No results for input(s): "WBC", "HGB", "HCT", "PLT" in the last 72 hours. BMET No results for input(s): "NA", "K", "CL", "CO2", "GLUCOSE", "BUN", "CREATININE", "CALCIUM" in the last 72 hours. LFT No results for input(s): "PROT", "ALBUMIN", "AST", "ALT", "ALKPHOS", "BILITOT", "BILIDIR", "IBILI" in the last 72 hours. PT/INR No results for input(s): "LABPROT", "INR" in the last 72 hours.   Impression / Plan: This is a 72 y.o.female who presents for Colonoscopy for surveillance in the setting of previous adenomas.  The risks and benefits of endoscopic evaluation/treatment were discussed with the patient and/or family; these include but are not limited to the risk of perforation, infection, bleeding, missed lesions, lack of diagnosis, severe illness requiring hospitalization, as well as anesthesia and sedation related illnesses.  The patient's history has been reviewed, patient examined, no change in status, and deemed stable for procedure.  The patient and/or family is agreeable to proceed.    Corliss Parish, MD [facility] Office # [phone]

## 2023-04-30 NOTE — Progress Notes (Unsigned)
Pt's states no medical or surgical changes since previsit or office visit. 

## 2023-04-30 NOTE — Op Note (Signed)
[facility] Patient Name: Joann Fields Procedure Date: 04/30/2023 11:37 AM MRN: [id number] Endoscopist: Corliss Parish , MD, [id number] Age: 72 Referring MD:  Date of Birth: 1950-11-27 Gender: Female Account #: [id number] Procedure:                Colonoscopy Indications:              Surveillance: Personal history of adenomatous                            polyps on last colonoscopy 3 years ago Medicines:                Monitored Anesthesia Care Procedure:                Pre-Anesthesia Assessment:                           - Prior to the procedure, a History and Physical                            was performed, and patient medications and                            allergies were reviewed. The patient's tolerance of                            previous anesthesia was also reviewed. The risks                            and benefits of the procedure and the sedation                            options and risks were discussed with the patient.                            All questions were answered, and informed consent                            was obtained. Prior Anticoagulants: The patient has                            taken no anticoagulant or antiplatelet agents. ASA                            Grade Assessment: II - A patient with mild systemic                            disease. After reviewing the risks and benefits,                            the patient was deemed in satisfactory condition to                            undergo the procedure.  After obtaining informed consent, the colonoscope                            was passed under direct vision. Throughout the                            procedure, the patient's blood pressure, pulse, and                            oxygen saturations were monitored continuously. The                            CF [id number] #[id number] was introduced through the anus                            and advanced to  the 3 cm into the ileum. The                            colonoscopy was performed without difficulty. The                            patient tolerated the procedure. The quality of the                            bowel preparation was good. The terminal ileum,                            ileocecal valve, appendiceal orifice, and rectum                            were photographed. Scope In: 12:00:12 PM Scope Out: 12:16:48 PM Scope Withdrawal Time: 0 hours 13 minutes 11 seconds  Total Procedure Duration: 0 hours 16 minutes 36 seconds  Findings:                 The digital rectal exam findings include                            hemorrhoids. Pertinent negatives include no                            palpable rectal lesions.                           The terminal ileum and ileocecal valve appeared                            normal.                           Multiple small-mouthed diverticula were found in                            the ascending colon.  Three sessile polyps were found in the transverse                            colon, ascending colon and cecum. The polyps were 2                            to 8 mm in size. These polyps were removed with a                            cold snare. Resection and retrieval were complete.                           Normal mucosa was found in the entire colon                            otherwise.                           Non-bleeding non-thrombosed external and internal                            hemorrhoids were found during retroflexion, during                            perianal exam and during digital exam. The                            hemorrhoids were Grade II (internal hemorrhoids                            that prolapse but reduce spontaneously). Complications:            No immediate complications. Estimated Blood Loss:     Estimated blood loss was minimal. Impression:               - Hemorrhoids found on digital  rectal exam.                           - The examined portion of the ileum was normal.                           - Diverticulosis in the ascending colon.                           - Three 2 to 8 mm polyps in the transverse colon,                            in the ascending colon and in the cecum, removed                            with a cold snare. Resected and retrieved.                           - Normal mucosa in the entire examined colon  otherwise.                           - Non-bleeding non-thrombosed external and internal                            hemorrhoids. Recommendation:           - The patient will be observed post-procedure,                            until all discharge criteria are met.                           - Discharge patient to home.                           - Patient has a contact number available for                            emergencies. The signs and symptoms of potential                            delayed complications were discussed with the                            patient. Return to normal activities tomorrow.                            Written discharge instructions were provided to the                            patient.                           - High fiber diet.                           - Use FiberCon 1-2 tablets PO daily.                           - Continue present medications.                           - Await pathology results.                           - Repeat colonoscopy in 3/5/7 years for                            surveillance based on pathology results and based                            on patient's overall health at the time.                           - The findings and recommendations were discussed  with the patient. Corliss Parish, MD 04/30/2023 12:28:12 PM

## 2023-05-03 ENCOUNTER — Telehealth: Payer: Self-pay | Admitting: *Deleted

## 2023-05-03 NOTE — Telephone Encounter (Signed)
No answer for post procedure call backs.

## 2023-05-04 ENCOUNTER — Encounter: Payer: Self-pay | Admitting: Gastroenterology

## 2023-05-05 DIAGNOSIS — Z01411 Encounter for gynecological examination (general) (routine) with abnormal findings: Secondary | ICD-10-CM | POA: Diagnosis not present

## 2023-05-05 DIAGNOSIS — Z124 Encounter for screening for malignant neoplasm of cervix: Secondary | ICD-10-CM | POA: Diagnosis not present

## 2023-05-05 DIAGNOSIS — M8580 Other specified disorders of bone density and structure, unspecified site: Secondary | ICD-10-CM | POA: Diagnosis not present

## 2023-05-05 DIAGNOSIS — Z1231 Encounter for screening mammogram for malignant neoplasm of breast: Secondary | ICD-10-CM | POA: Diagnosis not present

## 2023-05-05 DIAGNOSIS — Z01419 Encounter for gynecological examination (general) (routine) without abnormal findings: Secondary | ICD-10-CM | POA: Diagnosis not present

## 2023-05-05 LAB — HM MAMMOGRAPHY

## 2023-05-11 DIAGNOSIS — H35033 Hypertensive retinopathy, bilateral: Secondary | ICD-10-CM | POA: Diagnosis not present

## 2023-05-11 DIAGNOSIS — H30033 Focal chorioretinal inflammation, peripheral, bilateral: Secondary | ICD-10-CM | POA: Diagnosis not present

## 2023-05-11 DIAGNOSIS — Z79899 Other long term (current) drug therapy: Secondary | ICD-10-CM | POA: Diagnosis not present

## 2023-05-11 DIAGNOSIS — Z961 Presence of intraocular lens: Secondary | ICD-10-CM | POA: Diagnosis not present

## 2023-05-13 DIAGNOSIS — Z79899 Other long term (current) drug therapy: Secondary | ICD-10-CM | POA: Diagnosis not present

## 2023-05-13 DIAGNOSIS — H30033 Focal chorioretinal inflammation, peripheral, bilateral: Secondary | ICD-10-CM | POA: Diagnosis not present

## 2023-06-02 ENCOUNTER — Telehealth: Payer: Self-pay | Admitting: Internal Medicine

## 2023-06-18 ENCOUNTER — Encounter: Payer: Self-pay | Admitting: Internal Medicine

## 2023-06-18 ENCOUNTER — Ambulatory Visit: Payer: Medicare PPO | Admitting: Internal Medicine

## 2023-06-18 VITALS — BP 122/70 | HR 60 | Temp 98.0°F | Ht 65.0 in | Wt 152.0 lb

## 2023-06-18 DIAGNOSIS — I10 Essential (primary) hypertension: Secondary | ICD-10-CM | POA: Diagnosis not present

## 2023-06-18 DIAGNOSIS — E538 Deficiency of other specified B group vitamins: Secondary | ICD-10-CM | POA: Diagnosis not present

## 2023-06-18 DIAGNOSIS — E559 Vitamin D deficiency, unspecified: Secondary | ICD-10-CM | POA: Diagnosis not present

## 2023-06-18 DIAGNOSIS — M79604 Pain in right leg: Secondary | ICD-10-CM | POA: Diagnosis not present

## 2023-06-18 DIAGNOSIS — M79605 Pain in left leg: Secondary | ICD-10-CM

## 2023-06-18 DIAGNOSIS — E785 Hyperlipidemia, unspecified: Secondary | ICD-10-CM | POA: Diagnosis not present

## 2023-06-18 LAB — COMPREHENSIVE METABOLIC PANEL
ALT: 14 U/L (ref 0–35)
AST: 20 U/L (ref 0–37)
Albumin: 4.4 g/dL (ref 3.5–5.2)
Alkaline Phosphatase: 66 U/L (ref 39–117)
BUN: 16 mg/dL (ref 6–23)
CO2: 29 meq/L (ref 19–32)
Calcium: 10.6 mg/dL — ABNORMAL HIGH (ref 8.4–10.5)
Chloride: 105 meq/L (ref 96–112)
Creatinine, Ser: 0.99 mg/dL (ref 0.40–1.20)
GFR: 56.21 mL/min — ABNORMAL LOW (ref >60.00)
Glucose, Bld: 97 mg/dL (ref 70–99)
Potassium: 4.3 meq/L (ref 3.5–5.1)
Sodium: 140 meq/L (ref 135–145)
Total Bilirubin: 0.4 mg/dL (ref 0.2–1.2)
Total Protein: 7.2 g/dL (ref 6.0–8.3)

## 2023-06-18 LAB — CK: Total CK: 62 U/L (ref 7–177)

## 2023-06-18 LAB — VITAMIN B12: Vitamin B-12: 751 pg/mL (ref 211–911)

## 2023-06-18 LAB — MAGNESIUM: Magnesium: 2.1 mg/dL (ref 1.5–2.5)

## 2023-06-18 LAB — VITAMIN D 25 HYDROXY (VIT D DEFICIENCY, FRACTURES): VITD: 53.64 ng/mL (ref 30.00–100.00)

## 2023-06-18 NOTE — Patient Instructions (Addendum)
Use Magnesium oil spray for muscle cramps Tylenol PM at night

## 2023-06-18 NOTE — Assessment & Plan Note (Signed)
On vitamin B12

## 2023-06-18 NOTE — Progress Notes (Signed)
Subjective:  Patient ID: Joann Fields, female    DOB: 1951-04-30  Age: 72 y.o. MRN: [id number]  CC: Leg Pain (Patient states she is having some pain in her legs at night and during the day. Off and on for months. )   HPI MAYANNA HETZ presents for leg cramps -calves and feet x several months, primarily at night.  The patient denies claudication, cold extremities etc.  Outpatient Medications Prior to Visit  Medication Sig Dispense Refill   azaTHIOprine (IMURAN) 50 MG tablet Take 100 mg by mouth daily.     cholecalciferol (VITAMIN D3) 25 MCG (1000 UNIT) tablet Take 1,000 Units by mouth daily.     Cyanocobalamin (VITAMIN B-12) 1000 MCG SUBL PLACE 1 TABLET UNDER THE TONGUE DAILY. 100 tablet 3   diltiazem (CARDIZEM CD) 180 MG 24 hr capsule TAKE 1 CAPSULE BY MOUTH EVERY DAY 90 capsule 3   metroNIDAZOLE (METROGEL) 1 % gel Apply 1 Application topically daily.     tacrolimus (PROTOPIC) 0.03 % ointment Apply topically 2 (two) times daily.     Difluprednate 0.05 % EMUL SMARTSIG:1 Drop(s) In Eye(s) Twice Daily PRN (Patient not taking: Reported on 04/05/2023)     ondansetron (ZOFRAN-ODT) 4 MG disintegrating tablet Take 1 tablet (4 mg total) by mouth every 8 (eight) hours as needed for nausea or vomiting. (Patient not taking: Reported on 04/05/2023) 20 tablet 0   pantoprazole (PROTONIX) 40 MG tablet TAKE 1 TABLET BY MOUTH TWICE A DAY (Patient not taking: Reported on 04/05/2023) 180 tablet 1   acitretin (SORIATANE) 10 MG capsule Take 1 capsule daily for 1 week (10/12/22-10/18/22) then starting on 10/19/22 take 2 capsule daily (Patient not taking: Reported on 04/05/2023)     tamsulosin (FLOMAX) 0.4 MG CAPS capsule Take 1 capsule (0.4 mg total) by mouth daily. (Patient not taking: Reported on 04/30/2023) 7 capsule 0   No facility-administered medications prior to visit.    ROS: Review of Systems  Constitutional:  Negative for activity change, appetite change, chills, fatigue and unexpected weight  change.  HENT:  Negative for congestion, mouth sores and sinus pressure.   Eyes:  Negative for visual disturbance.  Respiratory:  Negative for cough and chest tightness.   Gastrointestinal:  Negative for abdominal pain and nausea.  Genitourinary:  Negative for difficulty urinating, frequency and vaginal pain.  Musculoskeletal:  Positive for myalgias. Negative for back pain and gait problem.  Skin:  Negative for pallor and rash.  Neurological:  Negative for dizziness, tremors, weakness, numbness and headaches.  Psychiatric/Behavioral:  Negative for confusion and sleep disturbance.     Objective:  BP 122/70   Pulse 60   Temp 98 F (36.7 C) (Oral)   Ht 5\' 5"  (1.651 m)   Wt 152 lb (68.9 kg)   SpO2 97%   BMI 25.29 kg/m   BP Readings from Last 3 Encounters:  06/18/23 122/70  04/30/23 139/76  03/29/23 120/80    Wt Readings from Last 3 Encounters:  06/18/23 152 lb (68.9 kg)  04/30/23 152 lb (68.9 kg)  04/05/23 152 lb (68.9 kg)    Physical Exam Constitutional:      General: She is not in acute distress.    Appearance: She is well-developed.  HENT:     Head: Normocephalic.     Right Ear: External ear normal.     Left Ear: External ear normal.     Nose: Nose normal.  Eyes:     General:  Right eye: No discharge.        Left eye: No discharge.     Conjunctiva/sclera: Conjunctivae normal.     Pupils: Pupils are equal, round, and reactive to light.  Neck:     Thyroid: No thyromegaly.     Vascular: No JVD.     Trachea: No tracheal deviation.  Cardiovascular:     Rate and Rhythm: Normal rate and regular rhythm.     Heart sounds: Normal heart sounds.  Pulmonary:     Effort: No respiratory distress.     Breath sounds: No stridor. No wheezing.  Abdominal:     General: Bowel sounds are normal. There is no distension.     Palpations: Abdomen is soft. There is no mass.     Tenderness: There is no abdominal tenderness. There is no guarding or rebound.  Musculoskeletal:         General: No tenderness.     Cervical back: Normal range of motion and neck supple. No rigidity.  Lymphadenopathy:     Cervical: No cervical adenopathy.  Skin:    Findings: No erythema or rash.  Neurological:     Cranial Nerves: No cranial nerve deficit.     Motor: No abnormal muscle tone.     Coordination: Coordination normal.     Deep Tendon Reflexes: Reflexes normal.  Psychiatric:        Behavior: Behavior normal.        Thought Content: Thought content normal.        Judgment: Judgment normal.   Calves-Nontender bilaterally, normal peripheral pulses  Lab Results  Component Value Date   WBC 5.6 03/24/2023   HGB 13.3 03/24/2023   HCT 40.5 03/24/2023   PLT 319.0 03/24/2023   GLUCOSE 97 06/18/2023   CHOL 199 03/24/2023   TRIG 66.0 03/24/2023   HDL 60.60 03/24/2023   LDLDIRECT 165.3 09/21/2013   LDLCALC 125 (H) 03/24/2023   ALT 14 06/18/2023   AST 20 06/18/2023   NA 140 06/18/2023   K 4.3 06/18/2023   CL 105 06/18/2023   CREATININE 0.99 06/18/2023   BUN 16 06/18/2023   CO2 29 06/18/2023   TSH 1.61 03/24/2023   HGBA1C 5.8 03/24/2023   MICROALBUR <0.7 03/24/2023    US PELVIC COMPLETE WITH TRANSVAGINAL  Result Date: 04/20/2023 CLINICAL DATA:  RLQ abd pain x 2 weeks. EXAM: ULTRASOUND OF PELVIS TECHNIQUE: Transabdominal and transvaginalultrasound examination of the pelvis was performed including evaluation of the uterus, ovaries, adnexal regions, and pelvic cul-de-sac. COMPARISON:  None Available. FINDINGS: Uterusanteverted, 5 x 3 x 3 cm. The endometrium not visualized. The uterine cavity is empty. There are no uterine masses. Images of the adnexa demonstrated no masses or fluid collections. Ovaries were not seen. IMPRESSION: Limited study with no gross abnormalities. Nonvisualized endometrium and ovaries. Electronically Signed   By: Layla Maw M.D.   On: 04/20/2023 17:23    Assessment & Plan:   Problem List Items Addressed This Visit     Dyslipidemia    Not  on statins CT coronary ca scoring test is 0 -- 2021      Essential hypertension    Continue on diltiazem      B12 deficiency    On vitamin B12      Relevant Orders   Vitamin B12 (Completed)   Leg pain, bilateral - Primary    Primarily cramps.   Use Magnesium oil spray for muscle cramps Tylenol PM at night Support knee-high's Warm socks Massage Okay to  take magnesium oral supplement Obtain lab work, arterial Doppler ultrasound      Relevant Orders   Comprehensive metabolic panel (Completed)   Magnesium (Completed)   CK (Completed)   PTH, intact and calcium   VAS [place] LOWER EXT ART SEG MULTI (SEGMENTALS & LE RAYNAUDS)   VITAMIN D 25 Hydroxy (Vit-D Deficiency, Fractures) (Completed)   Other Visit Diagnoses     Vitamin D deficiency             No orders of the defined types were placed in this encounter.     Follow-up: Return in about 3 months (around 09/18/2023).  Sonda Primes, MD

## 2023-06-18 NOTE — Assessment & Plan Note (Addendum)
Primarily cramps.   Use Magnesium oil spray for muscle cramps Tylenol PM at night Support knee-high's Warm socks Massage Okay to take magnesium oral supplement Obtain lab work, arterial Doppler ultrasound

## 2023-06-18 NOTE — Assessment & Plan Note (Signed)
Continue on diltiazem.

## 2023-06-18 NOTE — Assessment & Plan Note (Signed)
Not on statins CT coronary ca scoring test is 0 -- 2021

## 2023-06-21 LAB — TIQ-NTM

## 2023-06-25 ENCOUNTER — Ambulatory Visit (HOSPITAL_COMMUNITY)
Admission: RE | Admit: 2023-06-25 | Discharge: 2023-06-25 | Disposition: A | Discharge status: Home / Self Care | Payer: Medicare PPO | Source: Ambulatory Visit | Attending: Internal Medicine | Admitting: Internal Medicine

## 2023-06-25 DIAGNOSIS — M79605 Pain in left leg: Secondary | ICD-10-CM | POA: Diagnosis not present

## 2023-06-25 DIAGNOSIS — M79604 Pain in right leg: Secondary | ICD-10-CM | POA: Insufficient documentation

## 2023-06-26 LAB — VAS US LOWER EXT ART SEG MULTI (SEGMENTALS & LE RAYNAUDS)
Left ABI: 1.17
Right ABI: 1.11

## 2023-06-29 DIAGNOSIS — L438 Other lichen planus: Secondary | ICD-10-CM | POA: Diagnosis not present

## 2023-09-14 DIAGNOSIS — H40043 Steroid responder, bilateral: Secondary | ICD-10-CM | POA: Diagnosis not present

## 2023-09-14 DIAGNOSIS — H2000 Unspecified acute and subacute iridocyclitis: Secondary | ICD-10-CM | POA: Diagnosis not present

## 2023-09-14 DIAGNOSIS — H524 Presbyopia: Secondary | ICD-10-CM | POA: Diagnosis not present

## 2023-09-14 DIAGNOSIS — H40013 Open angle with borderline findings, low risk, bilateral: Secondary | ICD-10-CM | POA: Diagnosis not present

## 2023-09-14 DIAGNOSIS — H52223 Regular astigmatism, bilateral: Secondary | ICD-10-CM | POA: Diagnosis not present

## 2023-09-14 DIAGNOSIS — H53143 Visual discomfort, bilateral: Secondary | ICD-10-CM | POA: Diagnosis not present

## 2023-09-14 DIAGNOSIS — H5201 Hypermetropia, right eye: Secondary | ICD-10-CM | POA: Diagnosis not present

## 2023-10-08 DIAGNOSIS — L438 Other lichen planus: Secondary | ICD-10-CM | POA: Diagnosis not present

## 2023-11-15 ENCOUNTER — Ambulatory Visit (INDEPENDENT_AMBULATORY_CARE_PROVIDER_SITE_OTHER): Payer: Medicare PPO

## 2023-11-15 VITALS — BP 118/60 | HR 62 | Ht 65.0 in | Wt 154.6 lb

## 2023-11-15 DIAGNOSIS — Z0000 Encounter for general adult medical examination without abnormal findings: Secondary | ICD-10-CM | POA: Diagnosis not present

## 2023-11-15 NOTE — Progress Notes (Cosign Needed Addendum)
 Subjective:   Joann Fields is a 73 y.o. who presents for a Medicare Wellness preventive visit.  Visit Complete: In person   Persons Participating in Visit: Patient.  AWV Questionnaire: No: Patient Medicare AWV questionnaire was not completed prior to this visit.  Cardiac Risk Factors include: advanced age (>15men, >59 women);dyslipidemia;hypertension     Objective:    Today's Vitals   11/15/23 1019  BP: 118/60  Pulse: 62  Weight: 154 lb 9.6 oz (70.1 kg)  Height: 5\' 5"  (1.651 m)   Body mass index is 25.73 kg/m.     11/15/2023   10:17 AM 10/02/2022   10:42 AM 10/01/2021   10:26 AM 09/20/2020   11:05 AM 01/05/2019   10:12 AM 11/08/2017    9:44 AM 12/09/2016    9:39 AM  Advanced Directives  Does Patient Have a Medical Advance Directive? No No No No No No No  Does patient want to make changes to medical advance directive?     Yes (ED - Information included in AVS)    Would patient like information on creating a medical advance directive? Yes (MAU/Ambulatory/Procedural Areas - Information given) Yes (MAU/Ambulatory/Procedural Areas - Information given) Yes (MAU/Ambulatory/Procedural Areas - Information given) No - Patient declined  Yes (ED - Information included in AVS)     Current Medications (verified) Outpatient Encounter Medications as of 11/15/2023  Medication Sig   azaTHIOprine (IMURAN) 50 MG tablet Take 100 mg by mouth daily.   cholecalciferol (VITAMIN D3) 25 MCG (1000 UNIT) tablet Take 1,000 Units by mouth daily.   Cyanocobalamin (VITAMIN B-12) 1000 MCG SUBL PLACE 1 TABLET UNDER THE TONGUE DAILY.   Difluprednate 0.05 % EMUL    diltiazem (CARDIZEM CD) 180 MG 24 hr capsule TAKE 1 CAPSULE BY MOUTH EVERY DAY   [DISCONTINUED] metroNIDAZOLE (METROGEL) 1 % gel Apply 1 Application topically daily.   [DISCONTINUED] ondansetron (ZOFRAN-ODT) 4 MG disintegrating tablet Take 1 tablet (4 mg total) by mouth every 8 (eight) hours as needed for nausea or vomiting.   [DISCONTINUED]  pantoprazole (PROTONIX) 40 MG tablet TAKE 1 TABLET BY MOUTH TWICE A DAY   [DISCONTINUED] tacrolimus (PROTOPIC) 0.03 % ointment Apply topically 2 (two) times daily.   No facility-administered encounter medications on file as of 11/15/2023.    Allergies (verified) Amoxicillin   History: Past Medical History:  Diagnosis Date   Arthritis    Eyes   Cataract    removed 2021   Chronic kidney disease    Kidney Stone   GERD (gastroesophageal reflux disease)    Hyperlipidemia    Hypertension    Osteopenia    Past Surgical History:  Procedure Laterality Date   CATARACT EXTRACTION, BILATERAL Bilateral 2021   feb- right march-left   COLONOSCOPY  2018   COLONOSCOPY W/ POLYPECTOMY  04/30/2023   [facility]riel Mansouraty at [facility]   DG THUMB LEFT HAND     cut her hand while prepping chicken for supper   POLYPECTOMY     SVD x 2     WISDOM TOOTH EXTRACTION     Family History  Problem Relation Age of Onset   Lung cancer Mother 75   Heart disease Father 71       MI   Alzheimer's disease Brother 7   Early death Maternal Aunt    Hearing loss Maternal Aunt    Lymphoma Sister    Colon cancer Neg Hx    Colon polyps Neg Hx    Esophageal cancer Neg Hx  Rectal cancer Neg Hx    Stomach cancer Neg Hx    Social History   Socioeconomic History   Marital status: Divorced    Spouse name: Not on file   Number of children: 2   Years of education: Not on file   Highest education level: Not on file  Occupational History   Occupation: Retired  Tobacco Use   Smoking status: Never    Passive exposure: Never   Smokeless tobacco: Never   Tobacco comments:    only in [employer] Use   Vaping status: Never Used  Substance and Sexual Activity   Alcohol use: Yes    Alcohol/week: 0.0 standard drinks of alcohol    Comment: Rarely; once yearly   Drug use: No   Sexual activity: Not Currently  Other Topics Concern   Not on file  Social History Narrative   Not on file   Social Drivers of  Health   Financial Resource Strain: Low Risk  (11/15/2023)   Overall Financial Resource Strain (CARDIA)    Difficulty of Paying Living Expenses: Not hard at all  Food Insecurity: No Food Insecurity (11/15/2023)   Hunger Vital Sign    Worried About Running Out of Food in the Last Year: Never true    Ran Out of Food in the Last Year: Never true  Transportation Needs: No Transportation Needs (11/15/2023)   PRAPARE - Administrator, Civil Service (Medical): No    Lack of Transportation (Non-Medical): No  Physical Activity: Sufficiently Active (11/15/2023)   Exercise Vital Sign    Days of Exercise per Week: 3 days    Minutes of Exercise per Session: 60 min  Stress: No Stress Concern Present (11/15/2023)   [employer] of Occupational Health - Occupational Stress Questionnaire    Feeling of Stress : Not at all  Social Connections: Moderately Isolated (11/15/2023)   Social Connection and Isolation Panel [NHANES]    Frequency of Communication with Friends and Family: More than three times a week    Frequency of Social Gatherings with Friends and Family: Once a week    Attends Religious Services: Never    Database administrator or Organizations: No    Attends Engineer, structural: 1 to 4 times per year    Marital Status: Divorced    Tobacco Counseling Counseling given: No Tobacco comments: only in College    Clinical Intake:  Pre-visit preparation completed: Yes  Pain : No/denies pain     BMI - recorded: 25.73 Nutritional Status: BMI 25 -29 Overweight Nutritional Risks: None Diabetes: No  Lab Results  Component Value Date   HGBA1C 5.8 03/24/2023   HGBA1C 5.6 02/23/2022   HGBA1C 5.5 10/23/2016     How often do you need to have someone help you when you read instructions, pamphlets, or other written materials from your doctor or pharmacy?: 1 - Never  Interpreter Needed?: No  Information entered by :: Hassell Halim, CMA   Activities of Daily  Living     11/15/2023   10:21 AM  In your present state of health, do you have any difficulty performing the following activities:  Hearing? 0  Vision? 0  Difficulty concentrating or making decisions? 0  Walking or climbing stairs? 0  Dressing or bathing? 0  Doing errands, shopping? 0  Preparing Food and eating ? N  Using the Toilet? N  In the past six months, have you accidently leaked urine? N  Do you have problems  with loss of bowel control? N  Managing your Medications? N  Managing your Finances? N  Housekeeping or managing your Housekeeping? N    Patient Care Team: Plotnikov, Georgina Quint, MD as PCP - Darnelle Catalan, MD as Attending Physician (Obstetrics and Gynecology) Rachael Fee, MD (Inactive) as Attending Physician (Gastroenterology) Eber Jones, MD as Referring Physician (Ophthalmology) Blima Ledger, OD as Consulting Physician (Optometry) Basilio Cairo, MD as Consulting Physician (Dermatology)  Indicate any recent Medical Services you may have received from other than [facility] providers in the past year (date may be approximate).     Assessment:   This is a routine wellness examination for Joann Fields.  Hearing/Vision screen Hearing Screening - Comments:: Denies hearing difficulties   Vision Screening - Comments:: Wears eyeglasses for reading only - up to date with routine eye exams with Dr Hyacinth Meeker   Goals Addressed               This Visit's Progress     Patient Stated (pt-stated)        Patient stated she'd like to work more on eating a healthy diet and drink more water.       Depression Screen     11/15/2023   10:25 AM 06/18/2023    9:55 AM 02/22/2023    9:47 AM 10/02/2022   10:14 AM 10/01/2021   10:28 AM 01/27/2021    9:14 AM 09/20/2020   11:04 AM  PHQ 2/9 Scores  PHQ - 2 Score 0 0 0 0 0 0 0  PHQ- 9 Score 1          Fall Risk     11/15/2023   10:22 AM 06/18/2023    9:55 AM 02/22/2023    9:46 AM 10/02/2022   10:15 AM 10/01/2021   10:28 AM   Fall Risk   Falls in the past year? 0 0 0 0 0  Number falls in past yr: 0 0 0 0 0  Injury with Fall? 0 0 0 0 0  Risk for fall due to : No Fall Risks No Fall Risks No Fall Risks No Fall Risks No Fall Risks  Follow up Falls prevention discussed;Falls evaluation completed Falls evaluation completed Falls evaluation completed Falls prevention discussed Falls evaluation completed    MEDICARE RISK AT HOME:  Medicare Risk at Home Any stairs in or around the home?: Yes If so, are there any without handrails?: No Home free of loose throw rugs in walkways, pet beds, electrical cords, etc?: Yes Adequate lighting in your home to reduce risk of falls?: Yes Life alert?: No Use of a cane, walker or w/c?: No Grab bars in the bathroom?: No Shower chair or bench in shower?: No Elevated toilet seat or a handicapped toilet?: No  TIMED UP AND GO:  Was the test performed?  No  Cognitive Function: 6CIT completed        11/15/2023   10:24 AM 10/02/2022   10:15 AM 10/01/2021   10:30 AM  6CIT Screen  What Year? 0 points 0 points 0 points  What month? 0 points 0 points 0 points  What time? 0 points 0 points 0 points  Count back from 20 0 points 0 points 0 points  Months in reverse 0 points 0 points 2 points  Repeat phrase 0 points 0 points 0 points  Total Score 0 points 0 points 2 points    Immunizations Immunization History  Administered Date(s) Administered   Influenza, High Dose Seasonal PF  10/14/2016, 11/08/2017, 06/29/2018, 06/29/2018, 07/08/2020, 06/24/2021   Influenza, Quadrivalent, Recombinant, Inj, Pf 06/23/2019   Influenza,inj,Quad PF,6+ Mos 09/12/2013, 09/24/2014, 09/27/2015   Influenza-Unspecified 07/21/2022, 05/25/2023   PFIZER Comirnaty(Gray Top)Covid-19 Tri-Sucrose Vaccine 02/05/2021, 08/26/2022   PFIZER(Purple Top)SARS-COV-2 Vaccination 09/29/2019, 10/20/2019, 06/27/2020, 06/11/2021   Pneumococcal Conjugate-13 10/14/2016   Pneumococcal Polysaccharide-23 11/08/2017   Td  05/14/2010   Tdap 07/29/2020   Zoster Recombinant(Shingrix) 09/15/2021, 01/02/2022   Zoster, Live 08/08/2012    Screening Tests Health Maintenance  Topic Date Due   COVID-19 Vaccine (7 - 2024-25 season) 04/25/2023   MAMMOGRAM  05/04/2024   Medicare Annual Wellness (AWV)  11/14/2024   Colonoscopy  04/29/2026   DTaP/Tdap/Td (3 - Td or Tdap) 07/29/2030   Pneumonia Vaccine 61+ Years old  Completed   INFLUENZA VACCINE  Completed   DEXA SCAN  Completed   Hepatitis C Screening  Completed   Zoster Vaccines- Shingrix  Completed   HPV VACCINES  Aged Out    Health Maintenance  Health Maintenance Due  Topic Date Due   COVID-19 Vaccine (7 - 2024-25 season) 04/25/2023   Health Maintenance Items Addressed: 11/15/2023  Mammogram status: Completed 05/05/23. Pt stated she plans to schedule an appt at [facility] w/Dr Tanner.  Additional Screening:  Vision Screening: Recommended annual ophthalmology exams for early detection of glaucoma and other disorders of the eye.  Dental Screening: Recommended annual dental exams for proper oral hygiene  Community Resource Referral / Chronic Care Management: CRR required this visit?  No   CCM required this visit?  No     Plan:     I have personally reviewed and noted the following in the patient's chart:   Medical and social history Use of alcohol, tobacco or illicit drugs  Current medications and supplements including opioid prescriptions. Patient is not currently taking opioid prescriptions. Functional ability and status Nutritional status Physical activity Advanced directives List of other physicians Hospitalizations, surgeries, and ER visits in previous 12 months Vitals Screenings to include cognitive, depression, and falls Referrals and appointments  In addition, I have reviewed and discussed with patient certain preventive protocols, quality metrics, and best practice recommendations. A written personalized care plan for  preventive services as well as general preventive health recommendations were provided to patient.     Darreld Mclean, CMA   11/15/2023   After Visit Summary: (MyChart) Due to this being a telephonic visit, the after visit summary with patients personalized plan was offered to patient via MyChart   Notes: Nothing significant to report at this time.  Medical screening examination/treatment/procedure(s) were performed by non-physician practitioner and as supervising physician I was immediately available for consultation/collaboration.  I agree with above. Jacinta Shoe, MD

## 2023-11-15 NOTE — Patient Instructions (Addendum)
 Joann Fields , Thank you for taking time to come for your Medicare Wellness Visit. I appreciate your ongoing commitment to your health goals. Please review the following plan we discussed and let me know if I can assist you in the future.   Referrals/Orders/Follow-Ups/Clinician Recommendations: Aim for 30 minutes of exercise or brisk walking, 6-8 glasses of water, and 5 servings of fruits and vegetables each day. Mammogram is due in 2025 at [facility] w/Dr Tanner.  This is a list of the screening recommended for you and due dates:  Health Maintenance  Topic Date Due   COVID-19 Vaccine (7 - 2024-25 season) 04/25/2023   Mammogram  05/04/2024   Medicare Annual Wellness Visit  11/14/2024   Colon Cancer Screening  04/29/2026   DTaP/Tdap/Td vaccine (3 - Td or Tdap) 07/29/2030   Pneumonia Vaccine  Completed   Flu Shot  Completed   DEXA scan (bone density measurement)  Completed   Hepatitis C Screening  Completed   Zoster (Shingles) Vaccine  Completed   HPV Vaccine  Aged Out    Advanced directives: (Provided) Advance directive discussed with you today. I have provided a copy for you to complete at home and have notarized. Once this is complete, please bring a copy in to our office so we can scan it into your chart.   Next Medicare Annual Wellness Visit scheduled for next year: Yes

## 2023-11-16 DIAGNOSIS — H35033 Hypertensive retinopathy, bilateral: Secondary | ICD-10-CM | POA: Diagnosis not present

## 2023-11-16 DIAGNOSIS — H30033 Focal chorioretinal inflammation, peripheral, bilateral: Secondary | ICD-10-CM | POA: Diagnosis not present

## 2023-11-16 DIAGNOSIS — Z961 Presence of intraocular lens: Secondary | ICD-10-CM | POA: Diagnosis not present

## 2023-11-16 DIAGNOSIS — Z79899 Other long term (current) drug therapy: Secondary | ICD-10-CM | POA: Diagnosis not present

## 2023-11-17 ENCOUNTER — Encounter: Payer: Self-pay | Admitting: Family Medicine

## 2023-11-17 ENCOUNTER — Telehealth (INDEPENDENT_AMBULATORY_CARE_PROVIDER_SITE_OTHER): Admitting: Family Medicine

## 2023-11-17 DIAGNOSIS — N183 Chronic kidney disease, stage 3 unspecified: Secondary | ICD-10-CM

## 2023-11-17 DIAGNOSIS — D84821 Immunodeficiency due to drugs: Secondary | ICD-10-CM | POA: Diagnosis not present

## 2023-11-17 DIAGNOSIS — U071 COVID-19: Secondary | ICD-10-CM

## 2023-11-17 MED ORDER — NIRMATRELVIR/RITONAVIR (PAXLOVID) TABLET (RENAL DOSING): 2.0000 | ORAL_TABLET | Freq: Two times a day (BID) | ORAL | 0 refills | Status: AC

## 2023-11-17 NOTE — Patient Instructions (Signed)
 I have sent in Paxlovid for you to take twice a day for 5 days.    I recommend you eat with this medication, as it can upset your stomach if you do not.  This medication can sometimes leave a bad taste in your mouth, it is okay to use mints, gum, to help combat this.    If this occurs, it is temporary and will resolve once the medication course is completed. There is a minor risk of rebound COVID after taking Paxlovid.  Continue supportive care at home, make sure you are drinking plenty of fluids and getting some rest.  May take ibuprofen and Tylenol as needed for fever, aches and pains.  Stay home and away from everyone until you are 24 hours fever free without fever reducing medications.  Follow-up with me if symptoms are persisting over the next week.  Follow-up in the emergency room if you are experiencing shortness of breath, high fever, unremitting cough, severe fatigue, other concerning symptoms.

## 2023-11-17 NOTE — Progress Notes (Signed)
 Virtual Visit via Video Note  I connected with Joann Fields on 11/17/23 at 11:20 AM EDT by a video enabled telemedicine application and verified that I am speaking with the correct person using two identifiers.  Patient Location: Home Provider Location: Office/Clinic  I discussed the limitations, risks, security, and privacy concerns of performing an evaluation and management service by video and the availability of in person appointments. I also discussed with the patient that there may be a patient responsible charge related to this service. The patient expressed understanding and agreed to proceed.  Subjective: PCP: Plotnikov, Georgina Quint, MD  No chief complaint on file.  HPI 73 year old female presents for A/V visit for evaluation of COVID-positive test at home. Reports runny nose, sore throat, fatigue, some sinus pain for the last day. Had positive COVID test at home yesterday. She takes azathioprine daily and is immunocompromised. Has been treating with Mucinex with mild temporary relief. Denies previous history of COVID, has completed COVID vaccines. Reports that her daughter has COVID. Denies abdominal pain, nausea, vomiting, diarrhea, rash, fever, chills, other symptoms. Medical history as outlined below.   ROS: Per HPI  Current Outpatient Medications:    nirmatrelvir/ritonavir, renal dosing, (PAXLOVID) 10 x 150 MG & 10 x 100MG  TABS, Take 2 tablets by mouth 2 (two) times daily for 5 days. (Take nirmatrelvir 150 mg one tablet twice daily for 5 days and ritonavir 100 mg one tablet twice daily for 5 days) Patient GFR is 57, Disp: 20 tablet, Rfl: 0   azaTHIOprine (IMURAN) 50 MG tablet, Take 100 mg by mouth daily., Disp: , Rfl:    cholecalciferol (VITAMIN D3) 25 MCG (1000 UNIT) tablet, Take 1,000 Units by mouth daily., Disp: , Rfl:    Cyanocobalamin (VITAMIN B-12) 1000 MCG SUBL, PLACE 1 TABLET UNDER THE TONGUE DAILY., Disp: 100 tablet, Rfl: 3   Difluprednate 0.05 % EMUL, ,  Disp: , Rfl:    diltiazem (CARDIZEM CD) 180 MG 24 hr capsule, TAKE 1 CAPSULE BY MOUTH EVERY DAY, Disp: 90 capsule, Rfl: 3  Observations/Objective: There were no vitals filed for this visit. Physical Exam Vitals and nursing note reviewed.  Constitutional:      General: She is not in acute distress.    Comments: Appears fatigued  HENT:     Head: Normocephalic and atraumatic.  Eyes:     Extraocular Movements: Extraocular movements intact.  Pulmonary:     Effort: Pulmonary effort is normal.  Musculoskeletal:     Cervical back: Normal range of motion.  Neurological:     General: No focal deficit present.     Mental Status: She is alert and oriented to person, place, and time.  Psychiatric:        Mood and Affect: Mood normal.        Behavior: Behavior normal.     Assessment and Plan: COVID-19 -     nirmatrelvir/ritonavir (renal dosing); Take 2 tablets by mouth 2 (two) times daily for 5 days. (Take nirmatrelvir 150 mg one tablet twice daily for 5 days and ritonavir 100 mg one tablet twice daily for 5 days) Patient GFR is 57  Dispense: 20 tablet; Refill: 0  Immunocompromised state due to drug therapy (HCC)  CRI (chronic renal insufficiency), stage 3 (moderate) (HCC)  Treated with Paxlovid, renal dosing due to chronic renal insufficiency and immunocompromised state.  Follow Up Instructions: Return if symptoms worsen or fail to improve.   I discussed the assessment and treatment plan with the patient. The patient  was provided an opportunity to ask questions, and all were answered. The patient agreed with the plan and demonstrated an understanding of the instructions.   The patient was advised to call back or seek an in-person evaluation if the symptoms worsen or if the condition fails to improve as anticipated.  The above assessment and management plan was discussed with the patient. The patient verbalized understanding of and has agreed to the management plan.   I spent 15  minutes on this patient encounter including counseling, diagnosis, treatment options, documentation, face-to-face time.   Moshe Cipro, FNP

## 2023-11-24 DIAGNOSIS — Z79899 Other long term (current) drug therapy: Secondary | ICD-10-CM | POA: Diagnosis not present

## 2023-11-24 DIAGNOSIS — H30033 Focal chorioretinal inflammation, peripheral, bilateral: Secondary | ICD-10-CM | POA: Diagnosis not present

## 2023-12-30 DIAGNOSIS — L438 Other lichen planus: Secondary | ICD-10-CM | POA: Diagnosis not present

## 2024-02-28 ENCOUNTER — Encounter: Payer: Self-pay | Admitting: Internal Medicine

## 2024-02-28 ENCOUNTER — Ambulatory Visit (INDEPENDENT_AMBULATORY_CARE_PROVIDER_SITE_OTHER): Admitting: Internal Medicine

## 2024-02-28 VITALS — BP 120/70 | HR 70 | Temp 97.7°F | Ht 65.0 in | Wt 159.0 lb

## 2024-02-28 DIAGNOSIS — Z780 Asymptomatic menopausal state: Secondary | ICD-10-CM

## 2024-02-28 DIAGNOSIS — Z0000 Encounter for general adult medical examination without abnormal findings: Secondary | ICD-10-CM | POA: Diagnosis not present

## 2024-02-28 DIAGNOSIS — L438 Other lichen planus: Secondary | ICD-10-CM

## 2024-02-28 DIAGNOSIS — M545 Low back pain, unspecified: Secondary | ICD-10-CM

## 2024-02-28 DIAGNOSIS — I10 Essential (primary) hypertension: Secondary | ICD-10-CM | POA: Diagnosis not present

## 2024-02-28 DIAGNOSIS — E538 Deficiency of other specified B group vitamins: Secondary | ICD-10-CM

## 2024-02-28 DIAGNOSIS — G8929 Other chronic pain: Secondary | ICD-10-CM

## 2024-02-28 DIAGNOSIS — M810 Age-related osteoporosis without current pathological fracture: Secondary | ICD-10-CM

## 2024-02-28 LAB — CBC WITH DIFFERENTIAL/PLATELET
Basophils Absolute: 0 K/uL (ref 0.0–0.1)
Basophils Relative: 0.8 % (ref 0.0–3.0)
Eosinophils Absolute: 0 K/uL (ref 0.0–0.7)
Eosinophils Relative: 0.7 % (ref 0.0–5.0)
HCT: 41.8 % (ref 36.0–46.0)
Hemoglobin: 14 g/dL (ref 12.0–15.0)
Lymphocytes Relative: 25.2 % (ref 12.0–46.0)
Lymphs Abs: 1.2 K/uL (ref 0.7–4.0)
MCHC: 33.4 g/dL (ref 30.0–36.0)
MCV: 95.8 fl (ref 78.0–100.0)
Monocytes Absolute: 0.3 K/uL (ref 0.1–1.0)
Monocytes Relative: 7 % (ref 3.0–12.0)
Neutro Abs: 3.1 K/uL (ref 1.4–7.7)
Neutrophils Relative %: 66.3 % (ref 43.0–77.0)
Platelets: 308 K/uL (ref 150.0–400.0)
RBC: 4.37 Mil/uL (ref 3.87–5.11)
RDW: 14.7 % (ref 11.5–15.5)
WBC: 4.7 K/uL (ref 4.0–10.5)

## 2024-02-28 LAB — URINALYSIS, ROUTINE W REFLEX MICROSCOPIC
Bilirubin Urine: NEGATIVE
Ketones, ur: NEGATIVE
Nitrite: NEGATIVE
Specific Gravity, Urine: 1.025 (ref 1.000–1.030)
Total Protein, Urine: NEGATIVE
Urine Glucose: NEGATIVE
Urobilinogen, UA: 0.2 (ref 0.0–1.0)
pH: 6 (ref 5.0–8.0)

## 2024-02-28 LAB — COMPREHENSIVE METABOLIC PANEL WITH GFR
ALT: 14 U/L (ref 0–35)
AST: 18 U/L (ref 0–37)
Albumin: 4.4 g/dL (ref 3.5–5.2)
Alkaline Phosphatase: 63 U/L (ref 39–117)
BUN: 15 mg/dL (ref 6–23)
CO2: 28 meq/L (ref 19–32)
Calcium: 10.6 mg/dL — ABNORMAL HIGH (ref 8.4–10.5)
Chloride: 107 meq/L (ref 96–112)
Creatinine, Ser: 1.08 mg/dL (ref 0.40–1.20)
GFR: 50.39 mL/min — ABNORMAL LOW (ref >60.00)
Glucose, Bld: 99 mg/dL (ref 70–99)
Potassium: 4.1 meq/L (ref 3.5–5.1)
Sodium: 142 meq/L (ref 135–145)
Total Bilirubin: 0.3 mg/dL (ref 0.2–1.2)
Total Protein: 7.2 g/dL (ref 6.0–8.3)

## 2024-02-28 LAB — TSH: TSH: 1.31 u[IU]/mL (ref 0.35–5.50)

## 2024-02-28 LAB — LIPID PANEL
Cholesterol: 200 mg/dL (ref 0–200)
HDL: 62 mg/dL (ref >39.00)
LDL Cholesterol: 123 mg/dL — ABNORMAL HIGH (ref 0–99)
NonHDL: 137.84
Total CHOL/HDL Ratio: 3
Triglycerides: 76 mg/dL (ref 0.0–149.0)
VLDL: 15.2 mg/dL (ref 0.0–40.0)

## 2024-02-28 NOTE — Assessment & Plan Note (Signed)
Start doing yoga 

## 2024-02-28 NOTE — Assessment & Plan Note (Signed)
Continue on diltiazem.

## 2024-02-28 NOTE — Patient Instructions (Addendum)
 AmLactin lotion Vit D3 and K2

## 2024-02-28 NOTE — Assessment & Plan Note (Signed)
 Laser treatments were suggested AmLactin lotion

## 2024-02-28 NOTE — Progress Notes (Signed)
 Subjective:  Patient ID: Joann Fields, female    DOB: Jun 02, 1951  Age: 73 y.o. MRN: [id number]  CC: Annual Exam (Pt is having sme dysuria x3 weeks a/w some abdomen cramping... Pt is equesting a CT Score test)   HPI Joann Fields presents for a well exam C/o urinary burning F/u HTN  Outpatient Medications Prior to Visit  Medication Sig Dispense Refill   azaTHIOprine (IMURAN) 50 MG tablet Take 100 mg by mouth daily.     cholecalciferol (VITAMIN D3) 25 MCG (1000 UNIT) tablet Take 1,000 Units by mouth daily.     Cyanocobalamin  (VITAMIN B-12) 1000 MCG SUBL PLACE 1 TABLET UNDER THE TONGUE DAILY. 100 tablet 3   Difluprednate 0.05 % EMUL      diltiazem  (CARDIZEM  CD) 180 MG 24 hr capsule TAKE 1 CAPSULE BY MOUTH EVERY DAY 90 capsule 3   No facility-administered medications prior to visit.    ROS: Review of Systems  Constitutional:  Negative for activity change, appetite change, chills, fatigue and unexpected weight change.  HENT:  Negative for congestion, mouth sores and sinus pressure.   Eyes:  Negative for visual disturbance.  Respiratory:  Negative for cough and chest tightness.   Cardiovascular:  Positive for palpitations. Negative for leg swelling.  Gastrointestinal:  Negative for abdominal pain and nausea.  Genitourinary:  Negative for difficulty urinating, frequency and vaginal pain.  Musculoskeletal:  Negative for arthralgias, back pain and gait problem.  Skin:  Negative for pallor and rash.  Neurological:  Negative for dizziness, tremors, weakness, numbness and headaches.  Psychiatric/Behavioral:  Negative for confusion, sleep disturbance and suicidal ideas.     Objective:  BP 120/70   Pulse 70   Temp 97.7 F (36.5 C) (Oral)   Ht 5' 5 (1.651 m)   Wt 159 lb (72.1 kg)   SpO2 98%   BMI 26.46 kg/m   BP Readings from Last 3 Encounters:  02/28/24 120/70  11/15/23 118/60  06/18/23 122/70    Wt Readings from Last 3 Encounters:  02/28/24 159 lb (72.1 kg)   11/15/23 154 lb 9.6 oz (70.1 kg)  06/18/23 152 lb (68.9 kg)    Physical Exam Constitutional:      General: She is not in acute distress.    Appearance: She is well-developed.  HENT:     Head: Normocephalic.     Right Ear: External ear normal.     Left Ear: External ear normal.     Nose: Nose normal.  Eyes:     General:        Right eye: No discharge.        Left eye: No discharge.     Conjunctiva/sclera: Conjunctivae normal.     Pupils: Pupils are equal, round, and reactive to light.  Neck:     Thyroid : No thyromegaly.     Vascular: No JVD.     Trachea: No tracheal deviation.  Cardiovascular:     Rate and Rhythm: Normal rate and regular rhythm.     Heart sounds: Normal heart sounds.  Pulmonary:     Effort: No respiratory distress.     Breath sounds: No stridor. No wheezing.  Abdominal:     General: Bowel sounds are normal. There is no distension.     Palpations: Abdomen is soft. There is no mass.     Tenderness: There is no abdominal tenderness. There is no guarding or rebound.  Musculoskeletal:        General: No tenderness.  Cervical back: Normal range of motion and neck supple. No rigidity.     Right lower leg: No edema.     Left lower leg: No edema.  Lymphadenopathy:     Cervical: No cervical adenopathy.  Skin:    Findings: No erythema or rash.  Neurological:     Cranial Nerves: No cranial nerve deficit.     Motor: No abnormal muscle tone.     Coordination: Coordination normal.     Deep Tendon Reflexes: Reflexes normal.  Psychiatric:        Behavior: Behavior normal.        Thought Content: Thought content normal.        Judgment: Judgment normal.     Lab Results  Component Value Date   WBC 5.6 03/24/2023   HGB 13.3 03/24/2023   HCT 40.5 03/24/2023   PLT 319.0 03/24/2023   GLUCOSE 97 06/18/2023   CHOL 199 03/24/2023   TRIG 66.0 03/24/2023   HDL 60.60 03/24/2023   LDLDIRECT 165.3 09/21/2013   LDLCALC 125 (H) 03/24/2023   ALT 14 06/18/2023    AST 20 06/18/2023   NA 140 06/18/2023   K 4.3 06/18/2023   CL 105 06/18/2023   CREATININE 0.99 06/18/2023   BUN 16 06/18/2023   CO2 29 06/18/2023   TSH 1.61 03/24/2023   HGBA1C 5.8 03/24/2023    VAS US  LOWER EXT ART SEG MULTI (SEGMENTALS & LE RAYNAUDS) Result Date: 06/26/2023  LOWER EXTREMITY DOPPLER STUDY Patient Name:  Joann Fields  Date of Exam:   06/25/2023 Medical Rec #: [id number]         Accession #:    [id number] Date of Birth: [birth year]         Patient Gender: F Patient Age:   1 years Exam Location:  [facility] Procedure:      VAS US  LOWER EXT ART SEG MULTI (SEGMENTALS & LE RAYNAUDS) Referring Phys: Naureen Benton --------------------------------------------------------------------------------  Indications: c/o leg cramps -calves and feet x several months, primarily at              night. High Risk Factors: Hypertension, hyperlipidemia, no history of smoking.  Comparison Study: None Performing Technologist: [employer] BS, RVT, RDCS  Examination Guidelines: A complete evaluation includes at minimum, Doppler waveform signals and systolic blood pressure reading at the level of bilateral brachial, anterior tibial, and posterior tibial arteries, when vessel segments are accessible. Bilateral testing is considered an integral part of a complete examination. Photoelectric Plethysmograph (PPG) waveforms and toe systolic pressure readings are included as required and additional duplex testing as needed. Limited examinations for reoccurring indications may be performed as noted.  ABI Findings: +---------+------------------+-----+---------+--------+ Right    Rt Pressure (mmHg)IndexWaveform Comment  +---------+------------------+-----+---------+--------+ Brachial 148                                      +---------+------------------+-----+---------+--------+ CFA                             triphasic         +---------+------------------+-----+---------+--------+ Popliteal                        triphasic         +---------+------------------+-----+---------+--------+ ATA      152  1.03 triphasic         +---------+------------------+-----+---------+--------+ PTA      164               1.11 triphasic         +---------+------------------+-----+---------+--------+ Great Toe172               1.16 Normal            +---------+------------------+-----+---------+--------+ +---------+------------------+-----+---------+-------+ Left     Lt Pressure (mmHg)IndexWaveform Comment +---------+------------------+-----+---------+-------+ Brachial 143                                     +---------+------------------+-----+---------+-------+ CFA                             triphasic        +---------+------------------+-----+---------+-------+ Popliteal                       triphasic        +---------+------------------+-----+---------+-------+ ATA      173               1.17 triphasic        +---------+------------------+-----+---------+-------+ PTA      170               1.15 triphasic        +---------+------------------+-----+---------+-------+ Great Toe168               1.14 Normal           +---------+------------------+-----+---------+-------+ +-------+-----------+-----------+------------+------------+ ABI/TBIToday's ABIToday's TBIPrevious ABIPrevious TBI +-------+-----------+-----------+------------+------------+ Right  1.11       1.16                                +-------+-----------+-----------+------------+------------+ Left   1.17       1.14                                +-------+-----------+-----------+------------+------------+  Summary: Right: Resting right ankle-brachial index is within normal range. The right toe-brachial index is normal. Left: Resting left ankle-brachial index is within normal range. The left toe-brachial index is normal. *See table(s) above for measurements and observations.   Electronically signed by Dorn Ross MD on 06/26/2023 at 1:27:30 PM.    Final     Assessment & Plan:   Problem List Items Addressed This Visit     Essential hypertension   Continue on diltiazem       Relevant Orders   DG Bone Density   TSH   Urinalysis   CBC with Differential/Platelet   Lipid panel   Comprehensive metabolic panel with GFR   Osteoporosis   DEXA q 2 years On Vit D and K2      Well adult exam - Primary     We discussed age appropriate health related issues, including available/recomended screening tests and vaccinations. Labs were ordered to be later reviewed . All questions were answered. We discussed one or more of the following - seat belt use, use of sunscreen/sun exposure exercise, safe sex, fall risk reduction, second hand smoke exposure, firearm use and storage, seat belt use, a need for adhering to healthy diet and exercise. Labs were ordered . All questions were answered.  CT coronary ca scoring  test ordered Shingrix discussed Colon - 2018, 2021, 2024; due in 2027 Dr Gorge: PAP, mammo, BDS due in 2025 Coronary calcium CT score of 0 - 2021 Ophth q 4 months DEXA  ordered Vit D3 and K2      Relevant Orders   DG Bone Density   TSH   Urinalysis   CBC with Differential/Platelet   Lipid panel   Comprehensive metabolic panel with GFR   B12 deficiency   On vitamin B12      Low back pain   Start doing yoga      Lichen planus pigmentosus   Laser treatments were suggested AmLactin lotion      Other Visit Diagnoses       Postmenopausal estrogen deficiency       Relevant Orders   DG Bone Density         No orders of the defined types were placed in this encounter.     Follow-up: Return in about 6 months (around 08/30/2024) for a follow-up visit.  Marolyn Noel, MD

## 2024-02-28 NOTE — Assessment & Plan Note (Addendum)
   We discussed age appropriate health related issues, including available/recomended screening tests and vaccinations. Labs were ordered to be later reviewed . All questions were answered. We discussed one or more of the following - seat belt use, use of sunscreen/sun exposure exercise, safe sex, fall risk reduction, second hand smoke exposure, firearm use and storage, seat belt use, a need for adhering to healthy diet and exercise. Labs were ordered . All questions were answered.  CT coronary ca scoring test ordered Shingrix discussed Colon - 2018, 2021, 2024; due in 2027 Dr Gorge: PAP, mammo, BDS due in 2025 Coronary calcium CT score of 0 - 2021 Ophth q 4 months DEXA  ordered Vit D3 and K2

## 2024-02-28 NOTE — Assessment & Plan Note (Signed)
 DEXA q 2 years On Vit D and K2

## 2024-02-28 NOTE — Assessment & Plan Note (Signed)
On vitamin B12

## 2024-02-29 ENCOUNTER — Ambulatory Visit: Payer: Self-pay | Admitting: Internal Medicine

## 2024-03-01 ENCOUNTER — Other Ambulatory Visit: Payer: Self-pay | Admitting: Internal Medicine

## 2024-03-02 ENCOUNTER — Telehealth: Payer: Self-pay

## 2024-03-02 NOTE — Telephone Encounter (Signed)
 Copied from CRM [id number]. Topic: Clinical - Medication Question >> Mar 02, 2024  3:53 PM Burnard DEL wrote: Reason for CRM: Patient calledi n stating that an antibiotic was suppose to  prescribed to her for UTI Macrobid. Pharmacy does not have prescription. She also would like to have refill of diltiazem  (CARDIZEM  CD) 180 MG 24 hr capsule.   [id number] [place], [place] - [address]  Phone: [phone] Fax: [phone]

## 2024-03-03 NOTE — Telephone Encounter (Signed)
 Reason for CRM: Patient calledi n stating that an antibiotic was suppose to  prescribed to her for UTI Macrobid. Pharmacy does not have prescription. She also would like to have refill of diltiazem  (CARDIZEM  CD) 180 MG 24 hr capsule.     [id number] [place], [place] - [address]    Phone:[phone] [phone]

## 2024-03-06 NOTE — Telephone Encounter (Addendum)
 Copied from CRM [id number]. Topic: Clinical - Medication Question >> Mar 06, 2024  1:32 PM Drema MATSU wrote: Patient stated that the pharmacy received diltiazem  (CARDIZEM  CD) 180 MG 24 hr but not the Macrobid. Patient is calling to check on it.

## 2024-03-07 NOTE — Telephone Encounter (Signed)
 Pt has called again to ask for an update about her antibiotic. Please send RX to pharmacy ASAP.

## 2024-03-08 MED ORDER — SULFAMETHOXAZOLE-TRIMETHOPRIM 800-160 MG PO TABS: 1.0000 | ORAL_TABLET | Freq: Two times a day (BID) | ORAL | 0 refills | Status: AC

## 2024-03-08 MED ORDER — DILTIAZEM HCL ER COATED BEADS 180 MG PO CP24: 180.0000 mg | ORAL_CAPSULE | Freq: Every day | ORAL | 3 refills | Status: AC

## 2024-03-08 NOTE — Addendum Note (Signed)
 Addended by: Neelie Welshans V on: 03/08/2024 07:36 AM   Modules accepted: Orders

## 2024-03-08 NOTE — Telephone Encounter (Signed)
 PCP has stated Noted.  Cardizem  was renewed.  Bactrim  double strength was sent in in place Macrobid. Thx

## 2024-03-08 NOTE — Telephone Encounter (Signed)
 Noted.  Cardizem  was renewed.  Bactrim  double strength was sent in in place Macrobid. Thx

## 2024-03-20 ENCOUNTER — Ambulatory Visit (INDEPENDENT_AMBULATORY_CARE_PROVIDER_SITE_OTHER)
Admission: RE | Admit: 2024-03-20 | Discharge: 2024-03-20 | Disposition: A | Discharge status: Home / Self Care | Source: Ambulatory Visit | Attending: Internal Medicine | Admitting: Internal Medicine

## 2024-03-20 DIAGNOSIS — I10 Essential (primary) hypertension: Secondary | ICD-10-CM

## 2024-03-20 DIAGNOSIS — Z780 Asymptomatic menopausal state: Secondary | ICD-10-CM | POA: Diagnosis not present

## 2024-03-20 DIAGNOSIS — Z0000 Encounter for general adult medical examination without abnormal findings: Secondary | ICD-10-CM

## 2024-03-21 DIAGNOSIS — Z961 Presence of intraocular lens: Secondary | ICD-10-CM | POA: Diagnosis not present

## 2024-03-21 DIAGNOSIS — Z79899 Other long term (current) drug therapy: Secondary | ICD-10-CM | POA: Diagnosis not present

## 2024-03-21 DIAGNOSIS — H35033 Hypertensive retinopathy, bilateral: Secondary | ICD-10-CM | POA: Diagnosis not present

## 2024-03-21 DIAGNOSIS — H30033 Focal chorioretinal inflammation, peripheral, bilateral: Secondary | ICD-10-CM | POA: Diagnosis not present

## 2024-03-22 DIAGNOSIS — H30033 Focal chorioretinal inflammation, peripheral, bilateral: Secondary | ICD-10-CM | POA: Diagnosis not present

## 2024-03-22 DIAGNOSIS — Z79899 Other long term (current) drug therapy: Secondary | ICD-10-CM | POA: Diagnosis not present

## 2024-03-31 ENCOUNTER — Other Ambulatory Visit

## 2024-04-01 LAB — PTH, INTACT AND CALCIUM
Calcium: 10.9 mg/dL — ABNORMAL HIGH (ref 8.6–10.4)
PTH: 110 pg/mL — ABNORMAL HIGH (ref 16–77)

## 2024-04-09 ENCOUNTER — Other Ambulatory Visit: Payer: Self-pay | Admitting: Internal Medicine

## 2024-04-09 ENCOUNTER — Ambulatory Visit: Payer: Self-pay | Admitting: Internal Medicine

## 2024-04-09 DIAGNOSIS — E213 Hyperparathyroidism, unspecified: Secondary | ICD-10-CM

## 2024-05-30 DIAGNOSIS — Z01419 Encounter for gynecological examination (general) (routine) without abnormal findings: Secondary | ICD-10-CM | POA: Diagnosis not present

## 2024-05-30 DIAGNOSIS — Z124 Encounter for screening for malignant neoplasm of cervix: Secondary | ICD-10-CM | POA: Diagnosis not present

## 2024-05-30 DIAGNOSIS — Z1331 Encounter for screening for depression: Secondary | ICD-10-CM | POA: Diagnosis not present

## 2024-05-30 DIAGNOSIS — R350 Frequency of micturition: Secondary | ICD-10-CM | POA: Diagnosis not present

## 2024-05-30 DIAGNOSIS — Z01411 Encounter for gynecological examination (general) (routine) with abnormal findings: Secondary | ICD-10-CM | POA: Diagnosis not present

## 2024-05-30 DIAGNOSIS — Z1231 Encounter for screening mammogram for malignant neoplasm of breast: Secondary | ICD-10-CM | POA: Diagnosis not present

## 2024-05-30 LAB — HM MAMMOGRAPHY

## 2024-07-17 DIAGNOSIS — N3281 Overactive bladder: Secondary | ICD-10-CM | POA: Diagnosis not present

## 2024-07-17 DIAGNOSIS — R350 Frequency of micturition: Secondary | ICD-10-CM | POA: Diagnosis not present

## 2024-07-25 DIAGNOSIS — Z961 Presence of intraocular lens: Secondary | ICD-10-CM | POA: Diagnosis not present

## 2024-07-25 DIAGNOSIS — H35033 Hypertensive retinopathy, bilateral: Secondary | ICD-10-CM | POA: Diagnosis not present

## 2024-07-25 DIAGNOSIS — H30033 Focal chorioretinal inflammation, peripheral, bilateral: Secondary | ICD-10-CM | POA: Diagnosis not present

## 2024-07-25 DIAGNOSIS — Z79899 Other long term (current) drug therapy: Secondary | ICD-10-CM | POA: Diagnosis not present

## 2024-08-01 ENCOUNTER — Ambulatory Visit: Admitting: "Endocrinology

## 2024-08-08 ENCOUNTER — Encounter: Payer: Self-pay | Admitting: "Endocrinology

## 2024-08-08 ENCOUNTER — Ambulatory Visit: Admitting: "Endocrinology

## 2024-08-08 VITALS — BP 130/80 | HR 86 | Ht 65.0 in | Wt 159.0 lb

## 2024-08-08 DIAGNOSIS — E213 Hyperparathyroidism, unspecified: Secondary | ICD-10-CM | POA: Diagnosis not present

## 2024-08-08 NOTE — Patient Instructions (Signed)
  You should collect every drop of urine during each 24 hour period. It does not matter how much or little urine is passed each time, as long as every drop is collected.   Begin the urine collection in the morning after you wake up.  The first time your empty your bladder, flush it down the toilet because that was yesterday's urine. Write down the exact time (eg, 6:15 AM). You will begin the urine collection at this time.  Every other time you urinate for the rest of the day and through the night, the urine goes into the jug.  Also the first time you empty your bladder the next morning, that goes into the jug.  This should be within 10 minutes before or after the time of the first morning void on the first day (which was flushed).  Bring the jug back to the lab when you have completed this.    If you need to urinate one hour before the final collection time, drink a full glass of water so that you can void again at the appropriate time. If you have to urinate 20 minutes before, try to hold the urine until the proper time.  Please note the exact time of the final collection, even if it is not the same time as when collection began on day one.    Store the jug in the refrigerator.  If you need to have a bowel movement, any urine passed with the bowel movement should be collected. Try not to include feces with the urine collection. If feces does get mixed in, do not try to remove the feces from the urine collection bottle.

## 2024-08-08 NOTE — Progress Notes (Signed)
 Outpatient Endocrinology Note Joann Birmingham, MD    KHRISTIE SAK [birth year] [id number]  Referring Provider: Garald Karlynn GAILS, MD Primary Care Provider: Garald Karlynn GAILS, MD Reason for consultation: Subjective   Assessment & Plan  Diagnoses and all orders for this visit:  Hyperparathyroidism -     PTH, intact and calcium -     Renal function panel -     Vitamin D  1,25 dihydroxy; Future -     VITAMIN D  25 Hydroxy (Vit-D Deficiency, Fractures) -     Magnesium; Future -     Calcium, 24-Hour Urine with Creatinine  Hypercalcemia -     PTH, intact and calcium -     Renal function panel -     Vitamin D  1,25 dihydroxy; Future -     VITAMIN D  25 Hydroxy (Vit-D Deficiency, Fractures) -     Magnesium; Future -     Calcium, 24-Hour Urine with Creatinine   Patient is referred for hyperparathyroidism Her GFR stays in the 50s recently 03/31/2024 PTH was elevated at 110, with calcium uncorrected at 10.9 Patient has had calcium elevations at least since 2022, with highest calcium being 12 in 2022 History of kidney stones in family, had a questionable unwitnessed kidney stone in self Patient currently takes no calcium, now, but takes vitamin D  03/20/2024 DEXA scan reported T-scores of -1.3 left femoral neck and -1.7 right femoral neck, L-spine -1.1= diagnosing osteopenia Ordered baseline labs as well as 24-hour urine collection to complete the workup   Return for visit, labs today, pick up container for 24 hour urine today, labs before next visit.   I have reviewed current medications, nurse's notes, allergies, vital signs, past medical and surgical history, family medical history, and social history for this encounter. Counseled patient on symptoms, examination findings, lab findings, imaging results, treatment decisions and monitoring and prognosis. The patient understood the recommendations and agrees with the treatment plan. All questions regarding treatment plan were  fully answered.  Joann Birmingham, MD  08/08/2024   History of Present Illness HPI   GIANELLE MCCAUL is a 73 y.o. female referred by Dr. Garald for evaluation and management of hypercalcemia.    Patient has a history of kidney stones (was thought to have it-not visualized.  She  current hematuria No polyuria No nocturia Yes: wakes up 3-4 times thirst Yes, at night renal disease Yes anorexia No abdominal pain Yes, every now and then heartburn Yes every now and then constipation No nausea or vomiting No history of peptic ulcer disease No depression No confusion No excessive fatigue No fracture No osteoporosis No, has osteopenia  headaches Yes, very seldom  numbness No tingling No  She takes Calcium No She takes Vitamin D  supplements Yes  She a history of taking chronic lithium No She a recent history of thiazide diuretic intake No  She family history of renal stones/hypercalcemia Yes: one of the daughter has kidney stones removed twice. Nephew has kidney stone  a personal history of MEN syndromes/medullary thyroid  cancer/ pheochromocytoma No  Physical Exam  BP 130/80   Pulse 86   Ht 5' 5 (1.651 m)   Wt 159 lb (72.1 kg)   SpO2 96%   BMI 26.46 kg/m    Constitutional: well developed, well nourished Head: normocephalic, atraumatic Eyes: sclera anicteric, no redness Neck: supple Lungs: normal respiratory effort Neurology: alert and oriented Skin: dry, no appreciable rashes Musculoskeletal: no appreciable defects Psychiatric: normal mood and affect   Current Medications  Patient's Medications  New Prescriptions   No medications on file  Previous Medications   AZATHIOPRINE (IMURAN) 50 MG TABLET    Take 100 mg by mouth daily.   CHOLECALCIFEROL (VITAMIN D3) 25 MCG (1000 UNIT) TABLET    Take 1,000 Units by mouth daily.   CYANOCOBALAMIN  (VITAMIN B-12) 1000 MCG SUBL    PLACE 1 TABLET UNDER THE TONGUE DAILY.   DIFLUPREDNATE 0.05 % EMUL       DILTIAZEM   (CARDIZEM  CD) 180 MG 24 HR CAPSULE    Take 1 capsule (180 mg total) by mouth daily.   SULFAMETHOXAZOLE -TRIMETHOPRIM  (BACTRIM  DS) 800-160 MG TABLET    Take 1 tablet by mouth 2 (two) times daily.  Modified Medications   No medications on file  Discontinued Medications   No medications on file    Allergies Allergies[1]  Past Medical History Past Medical History:  Diagnosis Date   Arthritis    Eyes   Cataract    removed 2021   Chronic kidney disease    Kidney Stone   GERD (gastroesophageal reflux disease)    Hyperlipidemia    Hypertension    Osteopenia     Past Surgical History Past Surgical History:  Procedure Laterality Date   CATARACT EXTRACTION, BILATERAL Bilateral 2021   feb- right march-left   COLONOSCOPY  2018   COLONOSCOPY W/ POLYPECTOMY  04/30/2023   [facility] at [facility]   DG THUMB LEFT HAND     cut her hand while prepping chicken for supper   POLYPECTOMY     SVD x 2     WISDOM TOOTH EXTRACTION      Family History family history includes Alzheimer's disease (age of onset: 69) in her brother; Early death in her maternal aunt; Hearing loss in her maternal aunt; Heart disease (age of onset: 54) in her father; Lung cancer (age of onset: 46) in her mother; Lymphoma in her sister.  Social History Social History   Socioeconomic History   Marital status: Divorced    Spouse name: Not on file   Number of children: 2   Years of education: Not on file   Highest education level: Not on file  Occupational History   Occupation: Retired  Tobacco Use   Smoking status: Never    Passive exposure: Never   Smokeless tobacco: Never   Tobacco comments:    only in [employer] Use   Vaping status: Never Used  Substance and Sexual Activity   Alcohol use: Yes    Alcohol/week: 0.0 standard drinks of alcohol    Comment: Rarely; once yearly   Drug use: No   Sexual activity: Not Currently  Other Topics Concern   Not on file  Social History Narrative   Not on  file   Social Drivers of Health   Tobacco Use: Low Risk (07/25/2024)   Received from [facility]   Patient History    Smoking Tobacco Use: Never    Smokeless Tobacco Use: Never    Passive Exposure: Not on file  Financial Resource Strain: Low Risk (11/15/2023)   Overall Financial Resource Strain (CARDIA)    Difficulty of Paying Living Expenses: Not hard at all  Food Insecurity: No Food Insecurity (11/15/2023)   Hunger Vital Sign    Worried About Running Out of Food in the Last Year: Never true    Ran Out of Food in the Last Year: Never true  Transportation Needs: No Transportation Needs (11/15/2023)   PRAPARE - Transportation    Lack  of Transportation (Medical): No    Lack of Transportation (Non-Medical): No  Physical Activity: Sufficiently Active (11/15/2023)   Exercise Vital Sign    Days of Exercise per Week: 3 days    Minutes of Exercise per Session: 60 min  Stress: No Stress Concern Present (11/15/2023)   [employer] of Occupational Health - Occupational Stress Questionnaire    Feeling of Stress : Not at all  Social Connections: Moderately Isolated (11/15/2023)   Social Connection and Isolation Panel    Frequency of Communication with Friends and Family: More than three times a week    Frequency of Social Gatherings with Friends and Family: Once a week    Attends Religious Services: Never    Database Administrator or Organizations: No    Attends Banker Meetings: 1 to 4 times per year    Marital Status: Divorced  Intimate Partner Violence: Not At Risk (11/15/2023)   Humiliation, Afraid, Rape, and Kick questionnaire    Fear of Current or Ex-Partner: No    Emotionally Abused: No    Physically Abused: No    Sexually Abused: No  Depression (PHQ2-9): Low Risk (11/15/2023)   Depression (PHQ2-9)    PHQ-2 Score: 1  Alcohol Screen: Low Risk (11/15/2023)   Alcohol Screen    Last Alcohol Screening Score (AUDIT): 0  Housing: Unknown (11/15/2023)   Housing Stability  Vital Sign    Unable to Pay for Housing in the Last Year: No    Number of Times Moved in the Last Year: Not on file    Homeless in the Last Year: No  Utilities: Not At Risk (11/15/2023)   [employer] Utilities    Threatened with loss of utilities: No  Health Literacy: Adequate Health Literacy (11/15/2023)   B1300 Health Literacy    Frequency of need for help with medical instructions: Never    Lab Results  Component Value Date   CHOL 200 02/28/2024   Lab Results  Component Value Date   HDL 62.00 02/28/2024   Lab Results  Component Value Date   LDLCALC 123 (H) 02/28/2024   Lab Results  Component Value Date   TRIG 76.0 02/28/2024   Lab Results  Component Value Date   CHOLHDL 3 02/28/2024   Lab Results  Component Value Date   CREATININE 1.08 02/28/2024   Lab Results  Component Value Date   GFR 50.39 (L) 02/28/2024      Component Value Date/Time   NA 142 02/28/2024 1103   K 4.1 02/28/2024 1103   CL 107 02/28/2024 1103   CO2 28 02/28/2024 1103   GLUCOSE 99 02/28/2024 1103   BUN 15 02/28/2024 1103   CREATININE 1.08 02/28/2024 1103   CALCIUM 10.9 (H) 03/31/2024 0839   PROT 7.2 02/28/2024 1103   ALBUMIN 4.4 02/28/2024 1103   AST 18 02/28/2024 1103   ALT 14 02/28/2024 1103   ALKPHOS 63 02/28/2024 1103   BILITOT 0.3 02/28/2024 1103   GFRNONAA 46 (L) 03/14/2023 1144      Latest Ref Rng & Units 03/31/2024    8:39 AM 02/28/2024   11:03 AM 06/18/2023   11:06 AM  BMP  Glucose 70 - 99 mg/dL  99  97   BUN 6 - 23 mg/dL  15  16   Creatinine 9.59 - 1.20 mg/dL  8.91  9.00   Sodium 864 - 145 mEq/L  142  140   Potassium 3.5 - 5.1 mEq/L  4.1  4.3   Chloride 96 -  112 mEq/L  107  105   CO2 19 - 32 mEq/L  28  29   Calcium 8.6 - 10.4 mg/dL 89.0  89.3  89.3        Component Value Date/Time   WBC 4.7 02/28/2024 1103   RBC 4.37 02/28/2024 1103   HGB 14.0 02/28/2024 1103   HCT 41.8 02/28/2024 1103   PLT 308.0 02/28/2024 1103   MCV 95.8 02/28/2024 1103   MCH 31.0 03/14/2023 1139    MCHC 33.4 02/28/2024 1103   RDW 14.7 02/28/2024 1103   LYMPHSABS 1.2 02/28/2024 1103   MONOABS 0.3 02/28/2024 1103   EOSABS 0.0 02/28/2024 1103   BASOSABS 0.0 02/28/2024 1103   Lab Results  Component Value Date   TSH 1.31 02/28/2024   TSH 1.61 03/24/2023   TSH 1.45 02/23/2022         Parts of this note may have been dictated using voice recognition software. There may be variances in spelling and vocabulary which are unintentional. Not all errors are proofread. Please notify the dino if any discrepancies are noted or if the meaning of any statement is not clear.      [1]  Allergies Allergen Reactions   Amoxicillin  Rash

## 2024-08-09 LAB — RENAL FUNCTION PANEL
Albumin: 4.4 g/dL (ref 3.6–5.1)
BUN: 16 mg/dL (ref 7–25)
CO2: 27 mmol/L (ref 20–32)
Calcium: 10.7 mg/dL — ABNORMAL HIGH (ref 8.6–10.4)
Chloride: 107 mmol/L (ref 98–110)
Creat: 0.98 mg/dL (ref 0.60–1.00)
Glucose, Bld: 95 mg/dL (ref 65–99)
Phosphorus: 2.5 mg/dL (ref 2.1–4.3)
Potassium: 4.1 mmol/L (ref 3.5–5.3)
Sodium: 141 mmol/L (ref 135–146)

## 2024-08-09 LAB — VITAMIN D 25 HYDROXY (VIT D DEFICIENCY, FRACTURES): Vit D, 25-Hydroxy: 60 ng/mL (ref 30–100)

## 2024-08-09 LAB — PTH, INTACT AND CALCIUM
Calcium: 10.7 mg/dL — ABNORMAL HIGH (ref 8.6–10.4)
PTH: 144 pg/mL — ABNORMAL HIGH (ref 16–77)

## 2024-08-09 LAB — MAGNESIUM: Magnesium: 2.1 mg/dL (ref 1.5–2.5)

## 2024-08-14 ENCOUNTER — Other Ambulatory Visit

## 2024-08-16 LAB — CALCIUM, 24-HOUR URINE WITH CREATININE
CALCIUM/CREATININE RATIO: 131 mg/g{creat} (ref 30–275)
Calcium, 24H Urine: 160 mg/(24.h)
Creatinine, 24H Ur: 1.23 g/(24.h) (ref 0.50–2.15)

## 2024-09-04 ENCOUNTER — Ambulatory Visit: Admitting: Internal Medicine

## 2024-09-04 ENCOUNTER — Other Ambulatory Visit: Payer: Self-pay | Admitting: "Endocrinology

## 2024-09-04 ENCOUNTER — Encounter: Payer: Self-pay | Admitting: Internal Medicine

## 2024-09-04 VITALS — BP 152/82 | HR 63 | Ht 65.0 in | Wt 160.8 lb

## 2024-09-04 DIAGNOSIS — I10 Essential (primary) hypertension: Secondary | ICD-10-CM

## 2024-09-04 DIAGNOSIS — N2889 Other specified disorders of kidney and ureter: Secondary | ICD-10-CM

## 2024-09-04 DIAGNOSIS — E538 Deficiency of other specified B group vitamins: Secondary | ICD-10-CM

## 2024-09-04 DIAGNOSIS — N3281 Overactive bladder: Secondary | ICD-10-CM

## 2024-09-04 DIAGNOSIS — L438 Other lichen planus: Secondary | ICD-10-CM

## 2024-09-04 DIAGNOSIS — E213 Hyperparathyroidism, unspecified: Secondary | ICD-10-CM

## 2024-09-04 NOTE — Progress Notes (Signed)
 "  Subjective:  Patient ID: Joann Fields, female    DOB: 1950/10/14  Age: 74 y.o. MRN: [id number]  CC: Medical Management of Chronic Issues (6 Month follow up)   HPI Joann Fields presents for hypertension, hypercalcemia, Vit D def  Outpatient Medications Prior to Visit  Medication Sig Dispense Refill   azaTHIOprine (IMURAN) 50 MG tablet Take 100 mg by mouth daily.     cholecalciferol (VITAMIN D3) 25 MCG (1000 UNIT) tablet Take 1,000 Units by mouth daily.     Cyanocobalamin  (VITAMIN B-12) 1000 MCG SUBL PLACE 1 TABLET UNDER THE TONGUE DAILY. 100 tablet 3   Difluprednate 0.05 % EMUL      diltiazem  (CARDIZEM  CD) 180 MG 24 hr capsule Take 1 capsule (180 mg total) by mouth daily. 90 capsule 3   sulfamethoxazole -trimethoprim  (BACTRIM  DS) 800-160 MG tablet Take 1 tablet by mouth 2 (two) times daily. 10 tablet 0   No facility-administered medications prior to visit.    ROS: Review of Systems  Constitutional:  Negative for activity change, appetite change, chills, fatigue and unexpected weight change.  HENT:  Negative for congestion, mouth sores and sinus pressure.   Eyes:  Negative for visual disturbance.  Respiratory:  Negative for cough and chest tightness.   Gastrointestinal:  Negative for abdominal pain and nausea.  Genitourinary:  Negative for difficulty urinating, frequency and vaginal pain.  Musculoskeletal:  Negative for back pain and gait problem.  Skin:  Positive for rash. Negative for pallor.  Neurological:  Negative for dizziness, tremors, weakness, numbness and headaches.  Psychiatric/Behavioral:  Negative for confusion and sleep disturbance.     Objective:  BP (!) 152/82   Pulse 63   Ht 5' 5 (1.651 m)   Wt 160 lb 12.8 oz (72.9 kg)   SpO2 98%   BMI 26.76 kg/m   BP Readings from Last 3 Encounters:  09/04/24 (!) 152/82  08/08/24 130/80  02/28/24 120/70    Wt Readings from Last 3 Encounters:  09/04/24 160 lb 12.8 oz (72.9 kg)  08/08/24 159 lb (72.1 kg)   02/28/24 159 lb (72.1 kg)    Physical Exam Constitutional:      General: She is not in acute distress.    Appearance: She is well-developed.  HENT:     Head: Normocephalic.     Right Ear: External ear normal.     Left Ear: External ear normal.     Nose: Nose normal.  Eyes:     General:        Right eye: No discharge.        Left eye: No discharge.     Conjunctiva/sclera: Conjunctivae normal.     Pupils: Pupils are equal, round, and reactive to light.  Neck:     Thyroid : No thyromegaly.     Vascular: No JVD.     Trachea: No tracheal deviation.  Cardiovascular:     Rate and Rhythm: Normal rate and regular rhythm.     Heart sounds: Normal heart sounds.  Pulmonary:     Effort: No respiratory distress.     Breath sounds: No stridor. No wheezing.  Abdominal:     General: Bowel sounds are normal. There is no distension.     Palpations: Abdomen is soft. There is no mass.     Tenderness: There is no abdominal tenderness. There is no guarding or rebound.  Musculoskeletal:        General: No tenderness.     Cervical back: Normal range of  motion and neck supple. No rigidity.     Right lower leg: No edema.     Left lower leg: No edema.  Lymphadenopathy:     Cervical: No cervical adenopathy.  Skin:    Findings: No erythema or rash.  Neurological:     Mental Status: She is oriented to person, place, and time.     Cranial Nerves: No cranial nerve deficit.     Motor: No abnormal muscle tone.     Coordination: Coordination normal.     Deep Tendon Reflexes: Reflexes normal.  Psychiatric:        Behavior: Behavior normal.        Thought Content: Thought content normal.        Judgment: Judgment normal.   Pigment distribution variation on the face and neck  Lab Results  Component Value Date   WBC 4.7 02/28/2024   HGB 14.0 02/28/2024   HCT 41.8 02/28/2024   PLT 308.0 02/28/2024   GLUCOSE 95 08/08/2024   CHOL 200 02/28/2024   TRIG 76.0 02/28/2024   HDL 62.00 02/28/2024    LDLDIRECT 165.3 09/21/2013   LDLCALC 123 (H) 02/28/2024   ALT 14 02/28/2024   AST 18 02/28/2024   NA 141 08/08/2024   K 4.1 08/08/2024   CL 107 08/08/2024   CREATININE 0.98 08/08/2024   BUN 16 08/08/2024   CO2 27 08/08/2024   TSH 1.31 02/28/2024   HGBA1C 5.8 03/24/2023    DG Bone Density Result Date: 03/22/2024 Table formatting from the original result was not included. Date of study: 03/20/2024 Exam: DUAL X-RAY ABSORPTIOMETRY (DXA) FOR BONE MINERAL DENSITY (BMD) Instrument: [employer] Requesting Provider: PCP Indication: follow up for low BMD Comparison: none (please note that it is not possible to compare data from different instruments) Clinical data: Pt is a 74 y.o. female without previous history of fracture. Results:  Lumbar spine L1-L4 Femoral neck (FN) T-score -1.1 RFN: -1.7 LFN: -1.3 Assessment: By the [facility] Criteria for diagnosis based on bone density, this patient has Low Bone Density FRAX 10-year fracture risk calculator: 5.3 % for any major fracture and 1.1 % for hip fracture. Pharmacologic therapy is recommended if 10 year fracture risk is >20% for any major osteoporotic fracture or >3% for hip fracture.  Comments: the technical quality of the study is good. WHO criteria for diagnosis of osteoporosis in postmenopausal women and in men 37 y/o or older: - normal: T-score -1.0 to + 1.0 - osteopenia/low bone density: T-score between -2.5 and -1.0 - osteoporosis: T-score below -2.5 - severe osteoporosis: T-score below -2.5 with history of fragility fracture Note: although not part of the WHO classification, the presence of a fragility fracture, regardless of the T-score, should be considered diagnostic of osteoporosis, provided other causes for the fracture have been excluded. RECOMMENDATION: 1. All patients should optimize calcium and vitamin D  intake. 2. Consider FDA-approved medical therapies in postmenopausal women and men aged 20 years and older, based on the following: a. A hip or  vertebral(clinical or morphometric) fracture. b. T-Score of  -2.5 or less at the femoral neck , total hip or spine after appropriate evaluation to exclude secondary causes c. Low bone mass (T-score between -1.0 and -2.5 at the femoral neck or spine) and a 10 year probability of a hip fracture >3% or a 10 year probability of major osteoporosis-related fracture > 20% based on the [place] -adapted WHO algorithm d. Clinical judgement and/or patient preferences may indicate treatment for people with 10-year fracture probabilities  above or below these levels Follow up BMD is recommended: 2 years Interpreted by : Stefano Redgie Butts, MD [facility]    Assessment & Plan:   Problem List Items Addressed This Visit     Essential hypertension - Primary   Continue on diltiazem       B12 deficiency   On vitamin B12      OAB (overactive bladder)   Hypercalcemia   F/u w/Dr Dartha      CRI (chronic renal insufficiency), stage 3 (moderate)   Hydrate well Monitor microalbumin in urine, UA      Lichen planus pigmentosus   F/u w/Dermatology         No orders of the defined types were placed in this encounter.     Follow-up: Return in about 6 months (around 03/04/2025) for a follow-up visit.  Marolyn Noel, MD "

## 2024-09-04 NOTE — Assessment & Plan Note (Signed)
On vitamin B12

## 2024-09-04 NOTE — Assessment & Plan Note (Signed)
 F/u w/Dr Roosevelt Locks

## 2024-09-04 NOTE — Assessment & Plan Note (Signed)
F/u w/Dermatology 

## 2024-09-04 NOTE — Assessment & Plan Note (Signed)
Continue on diltiazem.

## 2024-09-04 NOTE — Assessment & Plan Note (Addendum)
 Hydrate well Monitor microalbumin in urine, UA

## 2024-11-20 ENCOUNTER — Ambulatory Visit

## 2025-01-29 ENCOUNTER — Other Ambulatory Visit

## 2025-02-05 ENCOUNTER — Ambulatory Visit: Admitting: "Endocrinology
